# Patient Record
Sex: Female | Born: 1995 | Race: White | Hispanic: No | Marital: Single | Smoking: Never smoker
Health system: Southern US, Community
[De-identification: ages and names within clinical notes are randomized; demographics above are authoritative.]

## PROBLEM LIST (undated history)

## (undated) DIAGNOSIS — M419 Scoliosis, unspecified: Secondary | ICD-10-CM

## (undated) DIAGNOSIS — A498 Other bacterial infections of unspecified site: Secondary | ICD-10-CM

## (undated) DIAGNOSIS — L409 Psoriasis, unspecified: Secondary | ICD-10-CM

## (undated) DIAGNOSIS — F32A Depression, unspecified: Secondary | ICD-10-CM

## (undated) DIAGNOSIS — F419 Anxiety disorder, unspecified: Secondary | ICD-10-CM

## (undated) DIAGNOSIS — F329 Major depressive disorder, single episode, unspecified: Secondary | ICD-10-CM

## (undated) HISTORY — PX: MOUTH SURGERY: SHX715

## (undated) HISTORY — PX: APPENDECTOMY: SHX54

## (undated) HISTORY — DX: Other bacterial infections of unspecified site: A49.8

---
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
## 2018-07-17 ENCOUNTER — Other Ambulatory Visit: Payer: Self-pay | Admitting: Physician Assistant

## 2018-07-17 DIAGNOSIS — N644 Mastodynia: Secondary | ICD-10-CM

## 2018-07-26 ENCOUNTER — Other Ambulatory Visit: Payer: Self-pay

## 2018-07-26 ENCOUNTER — Ambulatory Visit
Admission: RE | Admit: 2018-07-26 | Discharge: 2018-07-26 | Disposition: A | Discharge status: Home / Self Care | Payer: Self-pay | Source: Ambulatory Visit | Attending: Physician Assistant | Admitting: Physician Assistant

## 2018-07-26 DIAGNOSIS — N644 Mastodynia: Secondary | ICD-10-CM

## 2018-10-05 NOTE — Progress Notes (Signed)
Office Visit Note  Patient: Evelyn Goodwin             Date of Birth: 25-Mar-1995           MRN: [id number]             PCP: Heywood Bene PA-C Referring: Arlyss Gandy* Visit Date: 10/11/2018 Occupation: Scientist, research (medical)  Subjective:  Other (patient has been on MTX and cosentyx in the past, currently on cimzia )   History of Present Illness: Evelyn Goodwin is a 23 y.o. female seen in consultation per request of her dermatologist.  According to patient in 2018 she developed psoriasis.  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  She was started on methotrexate which she took for 9 months.  Patient states while she was on methotrexate 4 tablets p.o. weekly.  She experienced frequent upper respiratory tract infections when she was on medication.  She states she stopped after 9 months as the skin cleared up.  Few months later she had recurrence of the rash along with joint pain.  At the time she was referred to Dr. Amil Amen.  She states Dr. Amil Amen did not find any evidence of arthritis and advised only treatment for psoriasis.  She was again seen by her dermatologist who tried Cosentyx.  Patient states she took it from May 2020 until July 2020.  Her skin cleared up but her joint pain persist.  At that time she was switched to Cimzia.  She states she has been on Cimzia for 2 months now.  She has been having flares of her skin psoriasis.  She also continues to have joint pain.  She describes pain and discomfort in her bilateral hands, bilateral wrist joints and her lower back.  She states the rash has been all over her body.  Patient states that her dermatologist has noted dactylitis in her toes.  There is positive history of psoriasis in her mother.  Activities of Daily Living:  Patient reports morning stiffness for several hours.   Patient Denies nocturnal pain.  Difficulty dressing/grooming: Denies Difficulty climbing stairs: Denies Difficulty  getting out of chair: Denies Difficulty using hands for taps, buttons, cutlery, and/or writing: Denies  Review of Systems  Constitutional: Positive for fatigue. Negative for night sweats, weight gain and weight loss.  HENT: Negative for mouth sores, trouble swallowing, trouble swallowing, mouth dryness and nose dryness.   Eyes: Positive for dryness. Negative for pain, redness, itching and visual disturbance.  Respiratory: Negative for cough, shortness of breath, wheezing and difficulty breathing.   Cardiovascular: Negative for chest pain, palpitations, hypertension, irregular heartbeat and swelling in legs/feet.  Gastrointestinal: Negative for blood in stool, constipation and diarrhea.  Endocrine: Negative for increased urination.  Genitourinary: Negative for difficulty urinating, painful urination and vaginal dryness.  Musculoskeletal: Positive for arthralgias, joint pain and morning stiffness. Negative for joint swelling, myalgias, muscle weakness, muscle tenderness and myalgias.  Skin: Positive for rash. Negative for color change, hair loss, skin tightness, ulcers and sensitivity to sunlight.  Allergic/Immunologic: Negative for susceptible to infections.  Neurological: Negative for dizziness, numbness, headaches, memory loss, night sweats and weakness.  Hematological: Positive for bruising/bleeding tendency. Negative for swollen glands.  Psychiatric/Behavioral: Negative for depressed mood, confusion and sleep disturbance. The patient is not nervous/anxious.     PMFS History:  Patient Active Problem List   Diagnosis Date Noted  . Anxiety and depression 10/11/2018  . Other idiopathic scoliosis, lumbar region 10/11/2018  . History of ADHD  10/11/2018  . 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 Psoriatic arthritis ([place]) 10/11/2018    History reviewed. No pertinent past medical history.  Family History  Problem Relation Age of Onset  . Psoriasis Mother   . Healthy Brother    Past Surgical  History:  Procedure Laterality Date  . [place] EXTRACTION  2014   Social History   Social History Narrative  . Not on file    There is no immunization history on file for this patient.   Objective: Vital Signs: BP 126/82 (BP Location: Right Arm, Patient Position: Sitting, Cuff Size: Normal)   Pulse (!) 103   Resp 12   Ht 5' 7.75" (1.721 m)   Wt 117 lb (53.1 kg)   BMI 17.92 kg/m    Physical Exam Vitals signs and nursing note reviewed.  Constitutional:      Appearance: She is well-developed.  HENT:     Head: Normocephalic and atraumatic.  Eyes:     Conjunctiva/sclera: Conjunctivae normal.  Neck:     Musculoskeletal: Normal range of motion.  Cardiovascular:     Rate and Rhythm: Normal rate and regular rhythm.     Heart sounds: Normal heart sounds.  Pulmonary:     Effort: Pulmonary effort is normal.     Breath sounds: Normal breath sounds.  Abdominal:     General: Bowel sounds are normal.     Palpations: Abdomen is soft.  Lymphadenopathy:     Cervical: No cervical adenopathy.  Skin:    General: Skin is warm and dry.     Capillary Refill: Capillary refill takes less than 2 seconds.     Comments: Guttate psoriasis lesions were noted on her extremities and trunk.  She also had a rash from recent allergic reaction on her face.  Neurological:     Mental Status: She is alert and oriented to person, place, and time.  Psychiatric:        Behavior: Behavior normal.      Musculoskeletal Exam: C-spine was in good range of motion.  She has thoracolumbar scoliosis.  She has some tenderness over SI joint and lumbar region.  Shoulder joints, elbow joints, wrist joints, MCPs PIPs DIPs with good range of motion.  She complains of tenderness on palpation over her wrist joints and PIPs.  No synovitis was noted.  She had good range of motion of her hip joints and knee joints.  Ankle joints MTPs PIPs with good range of motion.  She has bilateral pes planus.  CDAI Exam: CDAI Score:  - Patient Global: -; Provider Global: - Swollen: -; Tender: - Joint Exam   No joint exam has been documented for this visit   There is currently no information documented on the homunculus. Go to the Rheumatology activity and complete the homunculus joint exam.  Investigation: No additional findings.  Imaging: No results found.  Recent Labs: No results found for: WBC, HGB, PLT, NA, K, CL, CO2, GLUCOSE, BUN, CREATININE, BILITOT, ALKPHOS, AST, ALT, PROT, ALBUMIN, CALCIUM, GFRAA, QFTBGOLD, QFTBGOLDPLUS  Speciality Comments: No specialty comments available.  Procedures:  No procedures performed Allergies: Penicillin g   Assessment / Plan:     Visit Diagnoses: Psoriatic arthritis ([place]) -patient complains of pain and discomfort in multiple joints.  She has discomfort in her left shoulder, bilateral wrists, bilateral PIPs.  She also has history of dactylitis witnessed by her dermatologist.  Patient reports her joint pain has been worse since she has been on Cimzia.  She was treated with  Cosentyx for about 2 months but it was discontinued due to ongoing joint discomfort.  Patient gives history of total clearance of her skin on Cosentyx which has recurred now.  She also felt that she had less infections on Cosentyx.  We had detailed discussion regarding different treatment options.  I believe the trial of Cosentyx was very short and she may benefit by switching back to Cosentyx.  She has had all the labs done by her dermatologist.  I have advised her to schedule an appointment with a dermatologist and asked to switch back to Cosentyx.  She should discontinue Cimzia at least 2 weeks prior to Cosentyx dose.  I would like to see her back in 3 months to see the response to Cosentyx.  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  High risk medication use - previously on MTX (2018) she took it for 9 months but had frequent infections.  She tried  Cosentyx for 2 months and discontinued as she felt it was not very effective., Cimzia (2020) she had been on Cimzia for 2 months and having a flare of her psoriasis and increased joint pain.  Labs have been monitored by her dermatologist.  Pain in both hands -she complains of increased discomfort in her hands.  No synovitis was noted.  It is difficult to assess as she is already on treatment.  Plan: XR Hand 2 View Right, XR Hand 2 View Left.  X-ray of bilateral hands were unremarkable.  Pain in both feet -patient gives history of dactylitis witnessed by her dermatologist.  I do not see any synovitis on examination.  She has pes planus.  Proper fitting shoes were discussed.  Plan: XR Foot 2 Views Right, XR Foot 2 Views Left.  The x-ray of bilateral feet were within normal limits.  Chronic midline low back pain without sciatica -she has thoracolumbar dextroscoliosis.  She complains of chronic lower back pain.  She has some tenderness on palpation of her lumbar region.  Plan: XR Lumbar Spine 2-3 Views.  The x-ray showed dextroscoliosis.  No syndesmophytes or disc space narrowing was noted.  A handout on back exercises was given.  Chronic SI joint pain -she has tenderness on palpation over SI joints.  Plan: XR Pelvis 1-2 Views.  The x-ray of SI joints were unremarkable.  Other idiopathic scoliosis, thoracolumbar region  Positive ANA (antinuclear antibody) - 03/30/18:ANA 1:40 NS, Hep b surface Ab-, Hep C ab-04/14/17: Sed rate 2, CRP<0.31/25/19: RF<14, CRP 0.2, sed rate 6, ANA 1:80 nucleolar.  ANA was low titer and not significant.  Anxiety and depression  History of ADHD  Family history of psoriasis in mother  Orders: Orders Placed This Encounter  Procedures  . XR Hand 2 View Right  . XR Hand 2 View Left  . XR Foot 2 Views Right  . XR Foot 2 Views Left  . XR Lumbar Spine 2-3 Views  . XR Pelvis 1-2 Views   No orders of the defined types were placed in this encounter.   Face-to-face time  spent with patient was 50 minutes. Greater than 50% of time was spent in counseling and coordination of care.  Follow-Up Instructions: Return in about 3 months (around 01/10/2019) for Psoriatic arthritis, psoriasis.   Bo Merino, MD  Note - This record has been created using Editor, commissioning.  Chart creation errors have been sought, but may not always  have been located. Such creation errors do not reflect on  the standard of medical care.

## 2018-10-11 ENCOUNTER — Ambulatory Visit: Payer: Self-pay

## 2018-10-11 ENCOUNTER — Ambulatory Visit (INDEPENDENT_AMBULATORY_CARE_PROVIDER_SITE_OTHER): Payer: BC Managed Care – PPO | Admitting: Rheumatology

## 2018-10-11 ENCOUNTER — Other Ambulatory Visit: Payer: Self-pay

## 2018-10-11 ENCOUNTER — Encounter: Payer: Self-pay | Admitting: Rheumatology

## 2018-10-11 VITALS — BP 126/82 | HR 103 | Resp 12 | Ht 67.75 in | Wt 117.0 lb

## 2018-10-11 DIAGNOSIS — M79671 Pain in right foot: Secondary | ICD-10-CM | POA: Diagnosis not present

## 2018-10-11 DIAGNOSIS — G8929 Other chronic pain: Secondary | ICD-10-CM

## 2018-10-11 DIAGNOSIS — M79672 Pain in left foot: Secondary | ICD-10-CM

## 2018-10-11 DIAGNOSIS — L404 Guttate psoriasis: Secondary | ICD-10-CM

## 2018-10-11 DIAGNOSIS — Z79899 Other long term (current) drug therapy: Secondary | ICD-10-CM

## 2018-10-11 DIAGNOSIS — M545 Low back pain, unspecified: Secondary | ICD-10-CM

## 2018-10-11 DIAGNOSIS — M533 Sacrococcygeal disorders, not elsewhere classified: Secondary | ICD-10-CM | POA: Diagnosis not present

## 2018-10-11 DIAGNOSIS — M4125 Other idiopathic scoliosis, thoracolumbar region: Secondary | ICD-10-CM

## 2018-10-11 DIAGNOSIS — L4050 Arthropathic psoriasis, unspecified: Secondary | ICD-10-CM | POA: Diagnosis not present

## 2018-10-11 DIAGNOSIS — M79642 Pain in left hand: Secondary | ICD-10-CM

## 2018-10-11 DIAGNOSIS — M4126 Other idiopathic scoliosis, lumbar region: Secondary | ICD-10-CM | POA: Insufficient documentation

## 2018-10-11 DIAGNOSIS — F329 Major depressive disorder, single episode, unspecified: Secondary | ICD-10-CM | POA: Insufficient documentation

## 2018-10-11 DIAGNOSIS — F32A Depression, unspecified: Secondary | ICD-10-CM | POA: Insufficient documentation

## 2018-10-11 DIAGNOSIS — R768 Other specified abnormal immunological findings in serum: Secondary | ICD-10-CM

## 2018-10-11 DIAGNOSIS — F419 Anxiety disorder, unspecified: Secondary | ICD-10-CM

## 2018-10-11 DIAGNOSIS — M79641 Pain in right hand: Secondary | ICD-10-CM

## 2018-10-11 DIAGNOSIS — Z8659 Personal history of other mental and behavioral disorders: Secondary | ICD-10-CM

## 2018-10-11 DIAGNOSIS — R7689 Other specified abnormal immunological findings in serum: Secondary | ICD-10-CM

## 2018-10-11 DIAGNOSIS — Z840 Family history of diseases of the skin and subcutaneous tissue: Secondary | ICD-10-CM

## 2018-10-11 NOTE — Patient Instructions (Signed)

## 2018-10-13 ENCOUNTER — Ambulatory Visit: Payer: Self-pay | Admitting: Rheumatology

## 2018-11-07 ENCOUNTER — Ambulatory Visit: Payer: Self-pay | Admitting: Rheumatology

## 2018-11-28 ENCOUNTER — Emergency Department (HOSPITAL_BASED_OUTPATIENT_CLINIC_OR_DEPARTMENT_OTHER): Payer: BC Managed Care – PPO

## 2018-11-28 ENCOUNTER — Encounter (HOSPITAL_BASED_OUTPATIENT_CLINIC_OR_DEPARTMENT_OTHER): Payer: Self-pay | Admitting: *Deleted

## 2018-11-28 ENCOUNTER — Other Ambulatory Visit: Payer: Self-pay

## 2018-11-28 ENCOUNTER — Emergency Department (HOSPITAL_BASED_OUTPATIENT_CLINIC_OR_DEPARTMENT_OTHER)
Admission: EM | Admit: 2018-11-28 | Discharge: 2018-11-29 | Disposition: A | Discharge status: Home / Self Care | Payer: BC Managed Care – PPO | Attending: Emergency Medicine | Admitting: Emergency Medicine

## 2018-11-28 DIAGNOSIS — E860 Dehydration: Secondary | ICD-10-CM | POA: Insufficient documentation

## 2018-11-28 DIAGNOSIS — R110 Nausea: Secondary | ICD-10-CM | POA: Diagnosis not present

## 2018-11-28 DIAGNOSIS — Z20828 Contact with and (suspected) exposure to other viral communicable diseases: Secondary | ICD-10-CM | POA: Diagnosis not present

## 2018-11-28 DIAGNOSIS — R1084 Generalized abdominal pain: Secondary | ICD-10-CM | POA: Insufficient documentation

## 2018-11-28 DIAGNOSIS — K5790 Diverticulosis of intestine, part unspecified, without perforation or abscess without bleeding: Secondary | ICD-10-CM

## 2018-11-28 DIAGNOSIS — R109 Unspecified abdominal pain: Secondary | ICD-10-CM | POA: Diagnosis present

## 2018-11-28 DIAGNOSIS — Z79899 Other long term (current) drug therapy: Secondary | ICD-10-CM | POA: Diagnosis not present

## 2018-11-28 DIAGNOSIS — Z20822 Contact with and (suspected) exposure to covid-19: Secondary | ICD-10-CM

## 2018-11-28 HISTORY — DX: Anxiety disorder, unspecified: F41.9

## 2018-11-28 LAB — COMPREHENSIVE METABOLIC PANEL
ALT: 29 U/L (ref 0–44)
AST: 24 U/L (ref 15–41)
Albumin: 4.3 g/dL (ref 3.5–5.0)
Alkaline Phosphatase: 56 U/L (ref 38–126)
Anion gap: 13 (ref 5–15)
BUN: 13 mg/dL (ref 6–20)
CO2: 20 mmol/L — ABNORMAL LOW (ref 22–32)
Calcium: 9.2 mg/dL (ref 8.9–10.3)
Chloride: 105 mmol/L (ref 98–111)
Creatinine, Ser: 0.65 mg/dL (ref 0.44–1.00)
GFR calc Af Amer: >60 mL/min (ref >60)
GFR calc non Af Amer: >60 mL/min (ref >60)
Glucose, Bld: 92 mg/dL (ref 70–99)
Potassium: 3.7 mmol/L (ref 3.5–5.1)
Sodium: 138 mmol/L (ref 135–145)
Total Bilirubin: 0.5 mg/dL (ref 0.3–1.2)
Total Protein: 8.3 g/dL — ABNORMAL HIGH (ref 6.5–8.1)

## 2018-11-28 LAB — URINALYSIS, MICROSCOPIC (REFLEX)

## 2018-11-28 LAB — CBC WITH DIFFERENTIAL/PLATELET
Abs Immature Granulocytes: 0.03 10*3/uL (ref 0.00–0.07)
Basophils Absolute: 0.1 10*3/uL (ref 0.0–0.1)
Basophils Relative: 1 %
Eosinophils Absolute: 0 10*3/uL (ref 0.0–0.5)
Eosinophils Relative: 0 %
HCT: 42.7 % (ref 36.0–46.0)
Hemoglobin: 13.3 g/dL (ref 12.0–15.0)
Immature Granulocytes: 0 %
Lymphocytes Relative: 15 %
Lymphs Abs: 1.7 10*3/uL (ref 0.7–4.0)
MCH: 26.6 pg (ref 26.0–34.0)
MCHC: 31.1 g/dL (ref 30.0–36.0)
MCV: 85.4 fL (ref 80.0–100.0)
Monocytes Absolute: 0.6 10*3/uL (ref 0.1–1.0)
Monocytes Relative: 6 %
Neutro Abs: 8.8 10*3/uL — ABNORMAL HIGH (ref 1.7–7.7)
Neutrophils Relative %: 78 %
Platelets: 422 10*3/uL — ABNORMAL HIGH (ref 150–400)
RBC: 5 MIL/uL (ref 3.87–5.11)
RDW: 15.2 % (ref 11.5–15.5)
WBC: 11.2 10*3/uL — ABNORMAL HIGH (ref 4.0–10.5)
nRBC: 0 % (ref 0.0–0.2)

## 2018-11-28 LAB — LACTIC ACID, PLASMA: Lactic Acid, Venous: 1.3 mmol/L (ref 0.5–1.9)

## 2018-11-28 LAB — URINALYSIS, ROUTINE W REFLEX MICROSCOPIC
Glucose, UA: NEGATIVE mg/dL
Ketones, ur: 40 mg/dL — AB
Leukocytes,Ua: NEGATIVE
Nitrite: NEGATIVE
Protein, ur: NEGATIVE mg/dL
Specific Gravity, Urine: >1.03 — ABNORMAL HIGH (ref 1.005–1.030)
pH: 6 (ref 5.0–8.0)

## 2018-11-28 LAB — LIPASE, BLOOD: Lipase: 36 U/L (ref 11–51)

## 2018-11-28 LAB — MAGNESIUM: Magnesium: 2 mg/dL (ref 1.7–2.4)

## 2018-11-28 LAB — PREGNANCY, URINE: Preg Test, Ur: NEGATIVE

## 2018-11-28 MED ORDER — SODIUM CHLORIDE 0.9 % IV BOLUS
1000.0000 mL | Freq: Once | INTRAVENOUS | Status: AC
  Administered 2018-11-28: 1000 mL via INTRAVENOUS

## 2018-11-28 MED ORDER — SODIUM CHLORIDE 0.9 % IV BOLUS
2000.0000 mL | Freq: Once | INTRAVENOUS | Status: AC
  Administered 2018-11-28: 19:00:00 2000 mL via INTRAVENOUS

## 2018-11-28 MED ORDER — IOHEXOL 350 MG/ML SOLN
100.0000 mL | Freq: Once | INTRAVENOUS | Status: AC | PRN
  Administered 2018-11-28: 100 mL via INTRAVENOUS

## 2018-11-28 MED ORDER — FENTANYL CITRATE (PF) 100 MCG/2ML IJ SOLN
50.0000 ug | Freq: Once | INTRAMUSCULAR | Status: AC
  Administered 2018-11-28: 50 ug via INTRAVENOUS
  Filled 2018-11-28: qty 2

## 2018-11-28 MED ORDER — ONDANSETRON HCL 4 MG/2ML IJ SOLN
4.0000 mg | Freq: Once | INTRAMUSCULAR | Status: AC | PRN
  Administered 2018-11-28: 4 mg via INTRAVENOUS
  Filled 2018-11-28: qty 2

## 2018-11-28 NOTE — ED Notes (Signed)
ED Provider at bedside. 

## 2018-11-28 NOTE — ED Notes (Signed)
X-ray at bedside

## 2018-11-28 NOTE — ED Notes (Signed)
Patient transported to CT 

## 2018-11-28 NOTE — ED Notes (Signed)
Pt wants to talk to ordering provider before getting CT scan. PA aware.

## 2018-11-28 NOTE — Discharge Instructions (Signed)
Please quarantine yourself at home.  Even if your test comes back negative if you are still having symptoms you need to stay quarantined.  Schedule a follow-up appointment with your primary care doctor.  If you develop fevers, worsening symptoms, or unable to tolerate food or liquid or have any other concerns please seek additional medical care and evaluation.

## 2018-11-28 NOTE — ED Provider Notes (Signed)
[facility] EMERGENCY DEPARTMENT Provider Note   CSN: [id number] Arrival date & time: 11/28/18  1800     History   Chief Complaint Chief Complaint  Patient presents with   Abdominal Pain    HPI Evelyn Goodwin is a 23 y.o. female with a past medical history of psoriasis, ADHD, who presents today for evaluation of multiple complaints. She reports that over the past 2 to 3 days she has had worsening abdominal pain on the right side.  During this she has had nausea however has not vomited.  She denies any constipation or diarrhea.  She feels like the pain comes up into her chest some however does not radiate or move.  She denies any specific shortness of breath.  She denies any abnormal vaginal discharge.  No dysuria increased frequency or urgency.  She also reports that she has a coronavirus test in process by her PCP.  Today she went to the grocery store where she had a near syncopal event.  She reports that anytime she stands up she gets very lightheaded.  She has had decreased p.o. intake secondary to nausea and pain.  She has also been taking Levaquin recently for a right-sided ear infection.  She was previously taking a different antibiotic however does not know the name.  She is on day 5 of Levaquin and reports that her ear pain has improved.  She denies any headache or dizziness.     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      Home Medications    Prior to Admission medications   Medication Sig Start Date End Date Taking? Authorizing Provider    escitalopram (LEXAPRO) 10 MG tablet Take 10 mg by mouth daily.   Yes [provider]  levofloxacin (LEVAQUIN) 250 MG tablet Take 250 mg by mouth daily.   Yes [provider]  amphetamine-dextroamphetamine (ADDERALL XR) 20 MG 24 hr capsule Take by mouth. 10/13/18   [provider]  Certolizumab Pegol (CIMZIA [place]) Inject into the skin every 14 (fourteen) days.    [provider]  clotrimazole-betamethasone (LOTRISONE) cream Apply to affected area twice daily 10/09/18   [provider]  norethindrone-ethinyl estradiol (JUNEL 1/20) 1-20 MG-MCG tablet TAKE 1 TABLET BY MOUTH EVERY DAY 08/17/18   [provider]    Family History Family History  Problem Relation Age of Onset   Psoriasis Mother    Healthy Brother     Social History Social History   Tobacco Use   Smoking status: Never Smoker   Smokeless tobacco: Never Used  Substance Use Topics   Alcohol use: Not Currently   Drug use: Not Currently     Allergies   Penicillin g   Review of Systems Review of Systems  Constitutional: Positive for appetite change, chills and fatigue. Negative for fever.  HENT: Negative for ear pain, facial swelling and hearing loss.   Eyes: Negative for visual disturbance.  Respiratory: Negative for cough.   Cardiovascular: Negative for chest pain.  Gastrointestinal: Positive for abdominal pain  and nausea. Negative for diarrhea and vomiting.  Genitourinary: Negative for dysuria.  Musculoskeletal: Negative for back pain and neck pain.  Skin: Negative for color change and wound.  Neurological: Negative for weakness and headaches.  Psychiatric/Behavioral: Negative for confusion.  All other systems reviewed and are negative.    Physical Exam Updated Vital Signs BP 130/84    Pulse 82    Temp 98.3 F (36.8 C) (Oral)    Resp 13    Ht 5' 7"  (1.702 m)    Wt 53.1 kg    SpO2 100%    BMI 18.32 kg/m   Physical Exam Vitals signs and nursing note  reviewed.  Constitutional:      General: She is not in acute distress.    Appearance: She is well-developed.  HENT:     Head: Normocephalic and atraumatic.  Eyes:     Conjunctiva/sclera: Conjunctivae normal.  Neck:     Musculoskeletal: Neck supple.  Cardiovascular:     Rate and Rhythm: Regular rhythm. Tachycardia present.     Heart sounds: Normal heart sounds. No murmur.  Pulmonary:     Effort: Pulmonary effort is normal. No respiratory distress.     Breath sounds: Normal breath sounds.  Abdominal:     General: Abdomen is flat. Bowel sounds are decreased. There is no distension.     Palpations: Abdomen is soft.     Tenderness: There is abdominal tenderness in the right upper quadrant, right lower quadrant and epigastric area. There is no guarding or rebound.     Hernia: No hernia is present.  Skin:    General: Skin is warm and dry.  Neurological:     General: No focal deficit present.     Mental Status: She is alert.     Cranial Nerves: No cranial nerve deficit.  Psychiatric:        Mood and Affect: Mood normal.        Behavior: Behavior normal.      ED Treatments / Results  Labs (all labs ordered are listed, but only abnormal results are displayed) Labs Reviewed  CBC WITH DIFFERENTIAL/PLATELET - Abnormal; Notable for the following components:      Result Value   WBC 11.2 (*)    Platelets 422 (*)    Neutro Abs 8.8 (*)    All other components within normal limits  COMPREHENSIVE METABOLIC PANEL - Abnormal; Notable for the following components:   CO2 20 (*)    Total Protein 8.3 (*)    All other components within normal limits  URINALYSIS, ROUTINE W REFLEX MICROSCOPIC - Abnormal; Notable for the following components:   Specific Gravity, Urine >1.030 (*)    Hgb urine dipstick MODERATE (*)    Bilirubin Urine SMALL (*)    Ketones, ur 40 (*)    All other components within normal limits  URINALYSIS, MICROSCOPIC (REFLEX) - Abnormal; Notable for the following components:    Bacteria, UA RARE (*)    All other components within normal limits  LIPASE, BLOOD  MAGNESIUM  PREGNANCY, URINE  LACTIC ACID, PLASMA  LACTIC ACID, PLASMA    EKG None  Radiology Ct Angio Chest Pe W/cm &/or Wo Cm  Result Date: 11/28/2018 CLINICAL DATA:  Chest and abdominal pain EXAM: CT ANGIOGRAPHY CHEST CT ABDOMEN AND PELVIS WITH CONTRAST TECHNIQUE: Multidetector CT imaging of the chest was performed using the standard protocol during bolus administration of intravenous contrast. Multiplanar CT image reconstructions and MIPs were obtained to evaluate the vascular anatomy. Multidetector  CT imaging of the abdomen and pelvis was performed using the standard protocol during bolus administration of intravenous contrast. CONTRAST:  160m OMNIPAQUE IOHEXOL 350 MG/ML SOLN COMPARISON:  None. FINDINGS: CTA CHEST FINDINGS Cardiovascular: Thoracic aorta is well visualize with a normal branching pattern. No aneurysmal dilatation or dissection is noted. No cardiac enlargement is seen. No coronary calcifications are noted. The pulmonary artery is well visualized within normal branching pattern. No filling defects are identified to suggest pulmonary emboli. Mediastinum/Nodes: Thoracic inlet is within normal limits. No hilar or mediastinal adenopathy is noted. No mediastinal hematoma is seen. The esophagus is within normal limits. Lungs/Pleura: Lungs are clear. No pleural effusion or pneumothorax. Musculoskeletal: No chest wall abnormality. No acute or significant osseous findings. Review of the MIP images confirms the above findings. CT ABDOMEN and PELVIS FINDINGS Hepatobiliary: No focal liver abnormality is seen. No gallstones, gallbladder wall thickening, or biliary dilatation. Pancreas: Unremarkable. No pancreatic ductal dilatation or surrounding inflammatory changes. Spleen: Normal in size without focal abnormality. Adrenals/Urinary Tract: Adrenal glands are within normal limits. Kidneys demonstrate no renal  calculi or urinary tract obstructive changes. The bladder is partially distended. Stomach/Bowel: Scattered diverticular change of the colon is noted without evidence of diverticulitis. The appendix is within normal limits. No inflammatory changes to suggest appendicitis are seen. No small bowel abnormality is noted. The stomach is decompressed. Vascular/Lymphatic: No significant vascular findings are present. No enlarged abdominal or pelvic lymph nodes. Reproductive: Uterus and bilateral adnexa are unremarkable. Other: No abdominal wall hernia or abnormality. No abdominopelvic ascites. Musculoskeletal: Mild scoliosis concave to the left is noted at the thoracolumbar junction. Review of the MIP images confirms the above findings. IMPRESSION: CTA of the chest: No evidence of pulmonary emboli. No acute intrathoracic abnormality is noted. CT of the abdomen and pelvis: Scattered diverticular change of the colon without diverticulitis. Normal-appearing appendix. No other focal abnormality is seen. Electronically Signed   By: MInez CatalinaM.D.   On: 11/28/2018 21:41   Ct Abdomen Pelvis W Contrast  Result Date: 11/28/2018 CLINICAL DATA:  Chest and abdominal pain EXAM: CT ANGIOGRAPHY CHEST CT ABDOMEN AND PELVIS WITH CONTRAST TECHNIQUE: Multidetector CT imaging of the chest was performed using the standard protocol during bolus administration of intravenous contrast. Multiplanar CT image reconstructions and MIPs were obtained to evaluate the vascular anatomy. Multidetector CT imaging of the abdomen and pelvis was performed using the standard protocol during bolus administration of intravenous contrast. CONTRAST:  1040mOMNIPAQUE IOHEXOL 350 MG/ML SOLN COMPARISON:  None. FINDINGS: CTA CHEST FINDINGS Cardiovascular: Thoracic aorta is well visualize with a normal branching pattern. No aneurysmal dilatation or dissection is noted. No cardiac enlargement is seen. No coronary calcifications are noted. The pulmonary artery is  well visualized within normal branching pattern. No filling defects are identified to suggest pulmonary emboli. Mediastinum/Nodes: Thoracic inlet is within normal limits. No hilar or mediastinal adenopathy is noted. No mediastinal hematoma is seen. The esophagus is within normal limits. Lungs/Pleura: Lungs are clear. No pleural effusion or pneumothorax. Musculoskeletal: No chest wall abnormality. No acute or significant osseous findings. Review of the MIP images confirms the above findings. CT ABDOMEN and PELVIS FINDINGS Hepatobiliary: No focal liver abnormality is seen. No gallstones, gallbladder wall thickening, or biliary dilatation. Pancreas: Unremarkable. No pancreatic ductal dilatation or surrounding inflammatory changes. Spleen: Normal in size without focal abnormality. Adrenals/Urinary Tract: Adrenal glands are within normal limits. Kidneys demonstrate no renal calculi or urinary tract obstructive changes. The bladder is partially distended. Stomach/Bowel: Scattered diverticular change of  the colon is noted without evidence of diverticulitis. The appendix is within normal limits. No inflammatory changes to suggest appendicitis are seen. No small bowel abnormality is noted. The stomach is decompressed. Vascular/Lymphatic: No significant vascular findings are present. No enlarged abdominal or pelvic lymph nodes. Reproductive: Uterus and bilateral adnexa are unremarkable. Other: No abdominal wall hernia or abnormality. No abdominopelvic ascites. Musculoskeletal: Mild scoliosis concave to the left is noted at the thoracolumbar junction. Review of the MIP images confirms the above findings. IMPRESSION: CTA of the chest: No evidence of pulmonary emboli. No acute intrathoracic abnormality is noted. CT of the abdomen and pelvis: Scattered diverticular change of the colon without diverticulitis. Normal-appearing appendix. No other focal abnormality is seen. Electronically Signed   By: Inez Catalina M.D.   On:  11/28/2018 21:41   Dg Chest Port 1 View  Result Date: 11/28/2018 CLINICAL DATA:  Chest and abdominal pain. EXAM: PORTABLE CHEST 1 VIEW COMPARISON:  10/21/2012 FINDINGS: The cardiomediastinal contours are normal. The lungs are clear. Pulmonary vasculature is normal. No consolidation, pleural effusion, or pneumothorax. No acute osseous abnormalities are seen. IMPRESSION: Normal AP chest radiograph. Electronically Signed   By: Keith Rake M.D.   On: 11/28/2018 19:48    Procedures Procedures (including critical care time)  Medications Ordered in ED Medications  sodium chloride 0.9 % bolus 2,000 mL (0 mLs Intravenous Stopped 11/28/18 2039)  fentaNYL (SUBLIMAZE) injection 50 mcg (50 mcg Intravenous Given 11/28/18 2144)  ondansetron (ZOFRAN) injection 4 mg (4 mg Intravenous Given 11/28/18 2143)  sodium chloride 0.9 % bolus 1,000 mL (1,000 mLs Intravenous New Bag/Given 11/28/18 2143)  iohexol (OMNIPAQUE) 350 MG/ML injection 100 mL (100 mLs Intravenous Contrast Given 11/28/18 2117)   Orthostatic VS for the past 24 hrs:  BP- Lying Pulse- Lying BP- Sitting Pulse- Sitting BP- Standing at 0 minutes Pulse- Standing at 0 minutes  11/28/18 2217 114/82 66 117/84 74 116/88 92  11/28/18 1841 121/89 107 132/84 115 110/78 132      Initial Impression / Assessment and Plan / ED Course  I have reviewed the triage vital signs and the nursing notes.  Pertinent labs & imaging results that were available during my care of the patient were reviewed by me and considered in my medical decision making (see chart for details).  Clinical Course as of Nov 27 2340  Tue Nov 28, 2018  2050 Attempted to speak with patient about her results and attempt for scan.  She was using the restroom.  Attempted to contact her on the room phone which rang busy.   [EH]  2222 Updated patient on results.  Plan to repeat orthostatics and p.o. challenge.   [EH]  2324 Patient reports that she is feeling better and wishes to go  home at this time.  She did not get dizzy with repeat orthostatics.   [EH]    Clinical Course User Index [EH] Lorin Glass, PA-C      Patient presents today for evaluation of abdominal pain, nausea, and presyncopal events.  She is currently being tested for coronavirus by her primary care doctor.  She was orthostatic on arrival with heart rates elevating from 107 laying up into the 130s/40s with standing.  She has a slight leukocytosis at 11.2.  She is currently taking Levaquin for a ear infection.  CMP does not show a significant derangements.  Urine is not infected and she is not having urinary symptoms.  Urine does show elevated specific gravity consistent with suspected dehydration.  Lactic acid is not elevated and pregnancy test is negative.  Her abdomen is tender to palpation in the right upper and right lower quadrant raising concern for appendicitis versus cholecystitis.  CT scan abdomen pelvis was obtained showing diverticulosis without diverticulitis and no cause for her pain or symptoms found.  Based on her presyncopal events along with the degree of tachycardia and orthostasis CTA PE study was performed at the same time as CT abdomen pelvis which did not show any significant lung abnormalities, evidence of PE or cause for her symptoms.  Recommended conservative care.  She is instructed on bland diet and continued oral rehydration.  On repeat orthostatics after fluids she no longer felt dizzy and had improvement in both resting heart rate and orthostatic vital signs.  She requested discharge home.  Evelyn Goodwin was evaluated in Emergency Department on 11/28/2018 for the symptoms described in the history of present illness. She was evaluated in the context of the global COVID-19 pandemic, which necessitated consideration that the patient might be at risk for infection with the SARS-CoV-2 virus that causes COVID-19. Institutional protocols and algorithms that pertain to the  evaluation of patients at risk for COVID-19 are in a state of rapid change based on information released by regulatory bodies including the CDC and federal and state organizations. These policies and algorithms were followed during the patient's care in the ED.  Return precautions were discussed with patient who states their understanding.  At the time of discharge patient denied any unaddressed complaints or concerns.  Patient is agreeable for discharge home.   Final Clinical Impressions(s) / ED Diagnoses   Final diagnoses:  Suspected COVID-19 virus infection  Generalized abdominal pain  Diverticulosis  Dehydration    ED Discharge Orders    None       Lorin Glass, PA-C 11/28/18 2347    Maudie Flakes, MD 12/02/18 [phone]

## 2018-11-28 NOTE — ED Triage Notes (Signed)
C/o abd pain  And nausea and chest pain x 2 days , hx anxiety

## 2018-12-24 ENCOUNTER — Encounter (HOSPITAL_BASED_OUTPATIENT_CLINIC_OR_DEPARTMENT_OTHER): Payer: Self-pay | Admitting: *Deleted

## 2018-12-24 ENCOUNTER — Emergency Department (HOSPITAL_BASED_OUTPATIENT_CLINIC_OR_DEPARTMENT_OTHER)
Admission: EM | Admit: 2018-12-24 | Discharge: 2018-12-24 | Disposition: A | Discharge status: Home / Self Care | Payer: BC Managed Care – PPO | Attending: Emergency Medicine | Admitting: Emergency Medicine

## 2018-12-24 ENCOUNTER — Emergency Department (HOSPITAL_BASED_OUTPATIENT_CLINIC_OR_DEPARTMENT_OTHER): Payer: BC Managed Care – PPO

## 2018-12-24 ENCOUNTER — Other Ambulatory Visit: Payer: Self-pay

## 2018-12-24 DIAGNOSIS — Z880 Allergy status to penicillin: Secondary | ICD-10-CM | POA: Diagnosis not present

## 2018-12-24 DIAGNOSIS — Y929 Unspecified place or not applicable: Secondary | ICD-10-CM | POA: Insufficient documentation

## 2018-12-24 DIAGNOSIS — S0033XA Contusion of nose, initial encounter: Secondary | ICD-10-CM | POA: Diagnosis not present

## 2018-12-24 DIAGNOSIS — S62635A Displaced fracture of distal phalanx of left ring finger, initial encounter for closed fracture: Secondary | ICD-10-CM | POA: Diagnosis not present

## 2018-12-24 DIAGNOSIS — Y999 Unspecified external cause status: Secondary | ICD-10-CM | POA: Insufficient documentation

## 2018-12-24 DIAGNOSIS — S6992XA Unspecified injury of left wrist, hand and finger(s), initial encounter: Secondary | ICD-10-CM | POA: Diagnosis present

## 2018-12-24 DIAGNOSIS — S0083XA Contusion of other part of head, initial encounter: Secondary | ICD-10-CM | POA: Diagnosis not present

## 2018-12-24 DIAGNOSIS — Z79899 Other long term (current) drug therapy: Secondary | ICD-10-CM | POA: Diagnosis not present

## 2018-12-24 DIAGNOSIS — S62633A Displaced fracture of distal phalanx of left middle finger, initial encounter for closed fracture: Secondary | ICD-10-CM | POA: Insufficient documentation

## 2018-12-24 DIAGNOSIS — Y939 Activity, unspecified: Secondary | ICD-10-CM | POA: Diagnosis not present

## 2018-12-24 DIAGNOSIS — R42 Dizziness and giddiness: Secondary | ICD-10-CM | POA: Diagnosis not present

## 2018-12-24 HISTORY — DX: Scoliosis, unspecified: M41.9

## 2018-12-24 HISTORY — DX: Depression, unspecified: F32.A

## 2018-12-24 HISTORY — DX: Psoriasis, unspecified: L40.9

## 2018-12-24 LAB — RAPID URINE DRUG SCREEN, HOSP PERFORMED
Amphetamines: NOT DETECTED
Barbiturates: NOT DETECTED
Benzodiazepines: NOT DETECTED
Cocaine: NOT DETECTED
Opiates: NOT DETECTED
Tetrahydrocannabinol: NOT DETECTED

## 2018-12-24 LAB — PREGNANCY, URINE: Preg Test, Ur: NEGATIVE

## 2018-12-24 MED ORDER — HYDROCODONE-ACETAMINOPHEN 5-325 MG PO TABS
1.0000 | ORAL_TABLET | Freq: Once | ORAL | Status: AC
  Administered 2018-12-24: 1 via ORAL
  Filled 2018-12-24: qty 1

## 2018-12-24 MED ORDER — HYDROCODONE-ACETAMINOPHEN 5-325 MG PO TABS: 2.0000 | ORAL_TABLET | ORAL | 0 refills | Status: DC | PRN

## 2018-12-24 MED ORDER — TETANUS-DIPHTH-ACELL PERTUSSIS 5-2.5-18.5 LF-MCG/0.5 IM SUSP
0.5000 mL | Freq: Once | INTRAMUSCULAR | Status: AC
  Administered 2018-12-24: 0.5 mL via INTRAMUSCULAR
  Filled 2018-12-24: qty 0.5

## 2018-12-24 NOTE — Discharge Instructions (Signed)
May take Tylenol and ibuprofen as needed for pain.  Denies take more than 4000 mg Tylenol or more than 2400 mg ibuprofen daily.  If you may think you may be pregnant do not take ibuprofen.  I have written you for a short course of pain medicine.  Only take as needed.  Do not drive or operate heavy machinery while taking this medication.  This medication also does contain Tylenol.  Do not take this with additional Tylenol.  Follow-up with orthopedics for reevaluation of your fractures in your hand.  Keep the splint on except when bathing.  You may replace them after bathing.
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
## 2019-01-16 ENCOUNTER — Telehealth: Payer: Self-pay | Admitting: Rheumatology

## 2019-01-16 NOTE — Telephone Encounter (Signed)
Charna Elizabeth from [facility] left a voicemail stating Bert is a mutual patient and called to see when she had her last TB quantiferon gold.  Please call back at [phone] and ask to speak with the nurse.

## 2019-01-16 NOTE — Telephone Encounter (Signed)
Spoke with Rollene Fare and advised her that Dr. Estanislado Pandy has only seen the patient once on 10/11/2018 and labs were not drawn.

## 2019-01-26 ENCOUNTER — Other Ambulatory Visit: Payer: Self-pay

## 2019-01-26 ENCOUNTER — Ambulatory Visit (INDEPENDENT_AMBULATORY_CARE_PROVIDER_SITE_OTHER): Payer: BC Managed Care – PPO | Admitting: Otolaryngology

## 2019-01-26 ENCOUNTER — Encounter (INDEPENDENT_AMBULATORY_CARE_PROVIDER_SITE_OTHER): Payer: Self-pay | Admitting: Otolaryngology

## 2019-01-26 VITALS — Temp 97.7°F

## 2019-01-26 DIAGNOSIS — H60313 Diffuse otitis externa, bilateral: Secondary | ICD-10-CM | POA: Diagnosis not present

## 2019-01-26 NOTE — Progress Notes (Signed)
HPI: Evelyn Goodwin is a 24 y.o. female who presents for evaluation of chronic ear problems she has had since October.  She has previously seen Dr. Erik Obey and had a hearing test which was normal.  She complains of itching as well as pain in her ears as well as excessive drainage from both ears.  She has been tried on eardrops.  But she keeps having problems and today the left ear is little bit more sore than the right ear. She takes shots for psoriasis which she has had for years. She is allergic to penicillin. She works as up at [employer].  Past Medical History:  Diagnosis Date  . Anxiety   . Depression   . Psoriasis   . Scoliosis    Past Surgical History:  Procedure Laterality Date  . WISDOM TOOTH EXTRACTION  2014   Social History   Socioeconomic History  . Marital status: Single    Spouse name: Not on file  . Number of children: Not on file  . Years of education: Not on file  . Highest education level: Not on file  Occupational History  . Not on file  Tobacco Use  . Smoking status: Never Smoker  . Smokeless tobacco: Never Used  Substance and Sexual Activity  . Alcohol use: Not Currently  . Drug use: Not Currently  . Sexual activity: Not on file  Other Topics Concern  . Not on file  Social History Narrative  . Not on file   Social Determinants of Health   Financial Resource Strain:   . Difficulty of Paying Living Expenses: Not on file  Food Insecurity:   . Worried About Charity fundraiser in the Last Year: Not on file  . Ran Out of Food in the Last Year: Not on file  Transportation Needs:   . Lack of Transportation (Medical): Not on file  . Lack of Transportation (Non-Medical): Not on file  Physical Activity:   . Days of Exercise per Week: Not on file  . Minutes of Exercise per Session: Not on file  Stress:   . Feeling of Stress : Not on file  Social Connections:   . Frequency of Communication with Friends and Family: Not on file  .  Frequency of Social Gatherings with Friends and Family: Not on file  . Attends Religious Services: Not on file  . Active Member of Clubs or Organizations: Not on file  . Attends Archivist Meetings: Not on file  . Marital Status: Not on file   Family History  Problem Relation Age of Onset  . Psoriasis Mother   . Healthy Brother    Allergies  Allergen Reactions  . Penicillin G Rash   Prior to Admission medications   Medication Sig Start Date End Date Taking? Authorizing Provider  amphetamine-dextroamphetamine (ADDERALL XR) 20 MG 24 hr capsule Take by mouth. 10/13/18  Yes [provider]  Certolizumab Pegol (CIMZIA [place]) Inject into the skin every 14 (fourteen) days.   Yes [provider]  clotrimazole-betamethasone (LOTRISONE) cream Apply to affected area twice daily 10/09/18  Yes [provider]  escitalopram (LEXAPRO) 10 MG tablet Take 10 mg by mouth daily.   Yes [provider]  HYDROcodone-acetaminophen (NORCO/VICODIN) 5-325 MG tablet Take 2 tablets by mouth every 4 (four) hours as needed. 12/24/18  Yes Henderly, Britni A, PA-C  levofloxacin (LEVAQUIN) 250 MG tablet Take 250 mg by mouth daily.   Yes [provider]  norethindrone-ethinyl estradiol (JUNEL  1/20) 1-20 MG-MCG tablet TAKE 1 TABLET BY MOUTH EVERY DAY 08/17/18  Yes [provider]     Positive ROS: Otherwise negative  All other systems have been reviewed and were otherwise negative with the exception of those mentioned in the HPI and as above.  Physical Exam: Constitutional: Alert, well-appearing, no acute distress Ears: External ears without lesions or tenderness.  She has drainage from both ear canals with inflammation of the concha area of both ears.  On microscopic exam most of the inflammatory changes on the left side or more lateral.  On the right side she has slight inflammation extending more medially.  After cleaning the right ear canal with suction I  applied gentian violet and Ciprodex.  This was also applied to the left side along with mupirocin 2% ointment.  Hearing screening with tuning forks revealed good hearing in both ears which was symmetric. Nasal: External nose without lesions. Septum relatively midline.. Clear nasal passages.  No signs of infection. Oral: Lips and gums without lesions. Tongue and palate mucosa without lesions. Posterior oropharynx clear. Neck: No palpable adenopathy or masses Respiratory: Breathing comfortably  Skin: No facial/neck lesions.  She has some psoriatic changes behind the left ear.  Cerumen impaction removal  Date/Time: 01/26/2019 4:46 PM Performed by: Rozetta Nunnery, MD Authorized by: Rozetta Nunnery, MD   Consent:    Consent obtained:  Verbal   Consent given by:  Patient   Risks discussed:  Pain and bleeding Procedure details:    Location:  L ear and R ear   Procedure type: irrigation and suction   Post-procedure details:    Inspection:  TM intact   Hearing quality:  Improved   Patient tolerance of procedure:  Tolerated well, no immediate complications Comments:     Both TMs were clear and intact.  Head inflammatory changes on the right side extending more medially.  Inflammatory changes on the left side were much more lateral.    Assessment: Chronic bilateral external otitis.  Plan: After cleaning the ear canals I applied gentian violet and Ciprodex in both sides.  Also applied mupirocin 2% ointment on the left side which was little more sore. Prescribed Diprolene 0.05% cream to apply to itchy crusty areas.  And I prescribed mupirocin 2% ointment to apply to sore areas. Prescribed Cortisporin otic suspension drops to use if she has pain or discomfort deep within the ear canal. Also recommended discussion with her dermatologist concerning any creams that might be effective for psoriasis.  Radene Journey, MD

## 2019-02-06 ENCOUNTER — Ambulatory Visit (INDEPENDENT_AMBULATORY_CARE_PROVIDER_SITE_OTHER): Payer: BC Managed Care – PPO | Admitting: Otolaryngology

## 2019-02-06 ENCOUNTER — Other Ambulatory Visit: Payer: Self-pay

## 2019-02-06 VITALS — Temp 97.7°F

## 2019-02-06 DIAGNOSIS — H60331 Swimmer's ear, right ear: Secondary | ICD-10-CM

## 2019-02-06 NOTE — Progress Notes (Signed)
HPI: Evelyn Goodwin is a 24 y.o. female who returns today for evaluation of ear pain and discomfort.  She was seen 3 weeks ago with external otitis and treated with cefdinir as well as eardrops although she was unable to get the eardrops because of pharmacy not having them available.  She has been using a nasal steroid spray Nasacort to help with congestion in her nose.  Previously she had more pain in the left ear today she has more pain on the right side. This apparently just started about a month ago.  At times she has noticed some blockage of her hearing.  Past Medical History:  Diagnosis Date  . Anxiety   . Depression   . Psoriasis   . Scoliosis    Past Surgical History:  Procedure Laterality Date  . WISDOM TOOTH EXTRACTION  2014   Social History   Socioeconomic History  . Marital status: Single    Spouse name: Not on file  . Number of children: Not on file  . Years of education: Not on file  . Highest education level: Not on file  Occupational History  . Not on file  Tobacco Use  . Smoking status: Never Smoker  . Smokeless tobacco: Never Used  Substance and Sexual Activity  . Alcohol use: Not Currently  . Drug use: Not Currently  . Sexual activity: Not on file  Other Topics Concern  . Not on file  Social History Narrative  . Not on file   Social Determinants of Health   Financial Resource Strain:   . Difficulty of Paying Living Expenses: Not on file  Food Insecurity:   . Worried About Charity fundraiser in the Last Year: Not on file  . Ran Out of Food in the Last Year: Not on file  Transportation Needs:   . Lack of Transportation (Medical): Not on file  . Lack of Transportation (Non-Medical): Not on file  Physical Activity:   . Days of Exercise per Week: Not on file  . Minutes of Exercise per Session: Not on file  Stress:   . Feeling of Stress : Not on file  Social Connections:   . Frequency of Communication with Friends and Family: Not on file  .  Frequency of Social Gatherings with Friends and Family: Not on file  . Attends Religious Services: Not on file  . Active Member of Clubs or Organizations: Not on file  . Attends Archivist Meetings: Not on file  . Marital Status: Not on file   Family History  Problem Relation Age of Onset  . Psoriasis Mother   . Healthy Brother    Allergies  Allergen Reactions  . Penicillin G Rash   Prior to Admission medications   Medication Sig Start Date End Date Taking? Authorizing Provider  amphetamine-dextroamphetamine (ADDERALL XR) 20 MG 24 hr capsule Take by mouth. 10/13/18  Yes [provider]  Certolizumab Pegol (CIMZIA [place]) Inject into the skin every 14 (fourteen) days.   Yes [provider]  clotrimazole-betamethasone (LOTRISONE) cream Apply to affected area twice daily 10/09/18  Yes [provider]  escitalopram (LEXAPRO) 10 MG tablet Take 10 mg by mouth daily.   Yes [provider]  HYDROcodone-acetaminophen (NORCO/VICODIN) 5-325 MG tablet Take 2 tablets by mouth every 4 (four) hours as needed. 12/24/18  Yes Henderly, Britni A, PA-C  levofloxacin (LEVAQUIN) 250 MG tablet Take 250 mg by mouth daily.   Yes [provider]  norethindrone-ethinyl estradiol (JUNEL 1/20)  1-20 MG-MCG tablet TAKE 1 TABLET BY MOUTH EVERY DAY 08/17/18  Yes [provider]     Positive ROS: Otherwise negative  All other systems have been reviewed and were otherwise negative with the exception of those mentioned in the HPI and as above.  Physical Exam: Constitutional: Alert, well-appearing, no acute distress Ears: External ears without lesions or tenderness. Ear canals reveal some crusting on the lateral portion of the left ear canal but no real pain or discomfort.  The TM itself was clear with good mobility pneumatic otoscopy.  The right ear canal revealed diffuse inflammation swelling and pain consistent with acute right otitis externa.  The TM itself was  clear.  On tuning fork testing AC > BC bilaterally with symmetric hearing in both ears. Nasal: External nose without lesions. Septum relatively midline with mild rhinitis.. Clear nasal passages no signs of infection. Oral: Lips and gums without lesions. Tongue and palate mucosa without lesions. Posterior oropharynx clear. Neck: No palpable adenopathy or masses Respiratory: Breathing comfortably  Skin: No facial/neck lesions or rash noted.  Procedures  Assessment: Otitis externa  Plan: She inquired about possible surgery but discussed with her that the ear infections are more external of the ear canal. I applied gentian violet Ciprodex to both ear canals. Recommended using the Cortisporin otic suspension drops and also prescribed Cipro 500 mg twice daily for 1 week. She will follow-up next week for recheck.   Radene Journey, MD

## 2019-02-07 ENCOUNTER — Ambulatory Visit (INDEPENDENT_AMBULATORY_CARE_PROVIDER_SITE_OTHER): Payer: BC Managed Care – PPO | Admitting: Cardiology

## 2019-02-07 ENCOUNTER — Telehealth: Payer: Self-pay | Admitting: *Deleted

## 2019-02-07 ENCOUNTER — Other Ambulatory Visit: Payer: Self-pay

## 2019-02-07 ENCOUNTER — Encounter: Payer: Self-pay | Admitting: Cardiology

## 2019-02-07 VITALS — BP 110/74 | HR 96 | Temp 97.7°F | Ht 67.0 in | Wt 118.0 lb

## 2019-02-07 DIAGNOSIS — R002 Palpitations: Secondary | ICD-10-CM

## 2019-02-07 NOTE — Progress Notes (Signed)
Referring-Kevin Elmore, PA-C Reason for referral-palpitations  HPI: 24 year old female for evaluation of palpitations at request of Sherrilee Gilles.  CT November 2020 showed no pulmonary embolus.  Laboratories November 2020 showed potassium 3.7.  Rapid urine drug screen December 2020 negative.  TSH December 2020 1.464.  Patient states for the past 1 month she has had intermittent palpitations described as her heart racing for 5 to 8 seconds.  These occur suddenly and are not associated with activities.  No associated symptoms.  She otherwise denies dyspnea on exertion, orthopnea, PND, pedal edema, chest pain or syncope.  Cardiology now asked to evaluate.  Current Outpatient Medications  Medication Sig Dispense Refill  . amphetamine-dextroamphetamine (ADDERALL XR) 20 MG 24 hr capsule Take by mouth.    . Certolizumab Pegol (CIMZIA [place]) Inject into the skin every 14 (fourteen) days.    . clotrimazole-betamethasone (LOTRISONE) cream Apply to affected area twice daily    . escitalopram (LEXAPRO) 10 MG tablet Take 10 mg by mouth daily.    Marland Kitchen HYDROcodone-acetaminophen (NORCO/VICODIN) 5-325 MG tablet Take 2 tablets by mouth every 4 (four) hours as needed. 6 tablet 0  . levofloxacin (LEVAQUIN) 250 MG tablet Take 250 mg by mouth daily.    . norethindrone-ethinyl estradiol (JUNEL 1/20) 1-20 MG-MCG tablet TAKE 1 TABLET BY MOUTH EVERY DAY     No current facility-administered medications for this visit.    Allergies  Allergen Reactions  . Penicillin G Rash     Past Medical History:  Diagnosis Date  . Anxiety   . Depression   . Psoriasis   . Scoliosis     Past Surgical History:  Procedure Laterality Date  . WISDOM TOOTH EXTRACTION  2014    Social History   Socioeconomic History  . Marital status: Single    Spouse name: Not on file  . Number of children: Not on file  . Years of education: Not on file  . Highest education level: Not on file  Occupational History  . Not on file    Tobacco Use  . Smoking status: Never Smoker  . Smokeless tobacco: Never Used  Substance and Sexual Activity  . Alcohol use: Yes    Comment: Rare  . Drug use: Not Currently  . Sexual activity: Not on file  Other Topics Concern  . Not on file  Social History Narrative  . Not on file   Social Determinants of Health   Financial Resource Strain:   . Difficulty of Paying Living Expenses: Not on file  Food Insecurity:   . Worried About Charity fundraiser in the Last Year: Not on file  . Ran Out of Food in the Last Year: Not on file  Transportation Needs:   . Lack of Transportation (Medical): Not on file  . Lack of Transportation (Non-Medical): Not on file  Physical Activity:   . Days of Exercise per Week: Not on file  . Minutes of Exercise per Session: Not on file  Stress:   . Feeling of Stress : Not on file  Social Connections:   . Frequency of Communication with Friends and Family: Not on file  . Frequency of Social Gatherings with Friends and Family: Not on file  . Attends Religious Services: Not on file  . Active Member of Clubs or Organizations: Not on file  . Attends Archivist Meetings: Not on file  . Marital Status: Not on file  Intimate Partner Violence:   . Fear of Current or Ex-Partner:  Not on file  . Emotionally Abused: Not on file  . Physically Abused: Not on file  . Sexually Abused: Not on file    Family History  Problem Relation Age of Onset  . Psoriasis Mother   . Healthy Brother     ROS: no fevers or chills, productive cough, hemoptysis, dysphasia, odynophagia, melena, hematochezia, dysuria, hematuria, rash, seizure activity, orthopnea, PND, pedal edema, claudication. Remaining systems are negative.  Physical Exam:   Blood pressure 110/74, pulse 96, temperature 97.7 F (36.5 C), height 5' 7"  (1.702 m), weight 118 lb (53.5 kg).  General:  Well developed/well nourished in NAD Skin warm/dry Patient not depressed No peripheral  clubbing Back-normal HEENT-normal/normal eyelids Neck supple/normal carotid upstroke bilaterally; no bruits; no JVD; no thyromegaly chest - CTA/ normal expansion CV - RRR/normal S1 and S2; no murmurs, rubs or gallops;  PMI nondisplaced Abdomen -NT/ND, no HSM, no mass, + bowel sounds, no bruit 2+ femoral pulses, no bruits Ext-no edema, chords, 2+ DP Neuro-grossly nonfocal  ECG -December 25, 2018-sinus rhythm with RV conduction delay.  Personally reviewed  A/P  1 Palpitations-etiology unclear.  Question SVT.  We will arrange an event monitor to further assess.  I will arrange an echocardiogram to assess LV function.  Recent TSH normal.  Can add beta-blockade later if needed.  2 psoriasis-Per primary care.  Kirk Ruths, MD

## 2019-02-07 NOTE — Patient Instructions (Signed)
Medication Instructions:  NO CHANGE *If you need a refill on your cardiac medications before your next appointment, please call your pharmacy*  Lab Work: If you have labs (blood work) drawn today and your tests are completely normal, you will receive your results only by: Marland Kitchen MyChart Message (if you have MyChart) OR . A paper copy in the mail If you have any lab test that is abnormal or we need to change your treatment, we will call you to review the results.  Testing/Procedures: Your physician has requested that you have an echocardiogram. Echocardiography is a painless test that uses sound waves to create images of your heart. It provides your doctor with information about the size and shape of your heart and how well your heart's chambers and valves are working. This procedure takes approximately one hour. There are no restrictions for this procedure.[employer] has recommended that you wear a 30 DAY event monitor. Event monitors are medical devices that record the heart's electrical activity. Doctors most often [place] these monitors to diagnose arrhythmias. Arrhythmias are problems with the speed or rhythm of the heartbeat. The monitor is a small, portable device. You can wear one while you do your normal daily activities. This is usually used to diagnose what is causing palpitations/syncope (passing out).   Follow-Up: At [facility], you and your health needs are our priority.  As part of our continuing mission to provide you with exceptional heart care, we have created designated Provider Care Teams.  These Care Teams include your primary Cardiologist (physician) and Advanced Practice Providers (APPs -  Physician Assistants and Nurse Practitioners) who all work together to provide you with the care you need, when you need it.  Your next appointment:   3 month(s)  The format for your next appointment:   Either In Person or Virtual  Provider:   Kirk Ruths,  MD

## 2019-02-07 NOTE — Telephone Encounter (Signed)
Called to inform patient she has been enrolled for [employer] to ship a 30 day cardiac event monitor to her home. No answer, voice mail box full.

## 2019-02-13 ENCOUNTER — Ambulatory Visit (INDEPENDENT_AMBULATORY_CARE_PROVIDER_SITE_OTHER): Payer: BC Managed Care – PPO

## 2019-02-13 ENCOUNTER — Ambulatory Visit (INDEPENDENT_AMBULATORY_CARE_PROVIDER_SITE_OTHER): Payer: BC Managed Care – PPO | Admitting: Otolaryngology

## 2019-02-13 DIAGNOSIS — R002 Palpitations: Secondary | ICD-10-CM | POA: Diagnosis not present

## 2019-02-19 ENCOUNTER — Ambulatory Visit (HOSPITAL_COMMUNITY): Payer: BC Managed Care – PPO | Attending: Cardiology

## 2019-02-19 ENCOUNTER — Other Ambulatory Visit: Payer: Self-pay

## 2019-02-19 DIAGNOSIS — R002 Palpitations: Secondary | ICD-10-CM | POA: Insufficient documentation

## 2019-03-05 ENCOUNTER — Telehealth: Payer: Self-pay | Admitting: Cardiology

## 2019-03-05 NOTE — Telephone Encounter (Signed)
Patient c/o Palpitations:  High priority if patient c/o lightheadedness, shortness of breath, or chest pain  1) How long have you had palpitations/irregular HR/ Afib? Are you having the symptoms now? Yes some and the patient states every now and then  2) Are you currently experiencing lightheadedness, SOB or CP? No   3) Do you have a history of afib (atrial fibrillation) or irregular heart rhythm? No   4) Have you checked your BP or HR? (document readings if available) HR 104 BP normal  5) Are you experiencing any other symptoms? No  Patient would like to see the doctor sooner

## 2019-03-05 NOTE — Telephone Encounter (Signed)
Spoke with patient. Patient reports she has anxiety and she's noticing more palpitations. Patient not experiencing other symptoms. Patient requests to see Dr. Stanford Breed sooner than April, this month if possible. Patient scheduled for 03/12/19.

## 2019-03-06 NOTE — Progress Notes (Signed)
HPI: Follow-up palpitations. CT November 2020 showed no pulmonary embolus.  Laboratories November 2020 showed potassium 3.7.  Rapid urine drug screen December 2020 negative.  TSH December 2020 1.464.  Echocardiogram February 2021 showed normal LV function.  Monitor February 2021 showed.  Since last seen there is no dyspnea on exertion, orthopnea, PND, pedal edema, exertional chest pain or syncope.  Her palpitations have worsened.  They can last 30 seconds at a time.  No associated syncope.  Current Outpatient Medications  Medication Sig Dispense Refill  . amphetamine-dextroamphetamine (ADDERALL XR) 20 MG 24 hr capsule Take by mouth.    . busPIRone (BUSPAR) 7.5 MG tablet Take 7.5 mg by mouth 2 (two) times daily.    . Certolizumab Pegol (CIMZIA [place]) Inject into the skin every 14 (fourteen) days.    . clotrimazole-betamethasone (LOTRISONE) cream Apply to affected area twice daily    . escitalopram (LEXAPRO) 10 MG tablet Take 10 mg by mouth daily.    Marland Kitchen HYDROcodone-acetaminophen (NORCO/VICODIN) 5-325 MG tablet Take 2 tablets by mouth every 4 (four) hours as needed. 6 tablet 0  . levofloxacin (LEVAQUIN) 250 MG tablet Take 250 mg by mouth daily.    . norethindrone-ethinyl estradiol (JUNEL 1/20) 1-20 MG-MCG tablet TAKE 1 TABLET BY MOUTH EVERY DAY     No current facility-administered medications for this visit.     Past Medical History:  Diagnosis Date  . Anxiety   . Depression   . Psoriasis   . Scoliosis     Past Surgical History:  Procedure Laterality Date  . WISDOM TOOTH EXTRACTION  2014    Social History   Socioeconomic History  . Marital status: Single    Spouse name: Not on file  . Number of children: Not on file  . Years of education: Not on file  . Highest education level: Not on file  Occupational History  . Not on file  Tobacco Use  . Smoking status: Never Smoker  . Smokeless tobacco: Never Used  Substance and Sexual Activity  . Alcohol use: Yes    Comment: Rare   . Drug use: Not Currently  . Sexual activity: Not on file  Other Topics Concern  . Not on file  Social History Narrative  . Not on file   Social Determinants of Health   Financial Resource Strain:   . Difficulty of Paying Living Expenses: Not on file  Food Insecurity:   . Worried About Charity fundraiser in the Last Year: Not on file  . Ran Out of Food in the Last Year: Not on file  Transportation Needs:   . Lack of Transportation (Medical): Not on file  . Lack of Transportation (Non-Medical): Not on file  Physical Activity:   . Days of Exercise per Week: Not on file  . Minutes of Exercise per Session: Not on file  Stress:   . Feeling of Stress : Not on file  Social Connections:   . Frequency of Communication with Friends and Family: Not on file  . Frequency of Social Gatherings with Friends and Family: Not on file  . Attends Religious Services: Not on file  . Active Member of Clubs or Organizations: Not on file  . Attends Archivist Meetings: Not on file  . Marital Status: Not on file  Intimate Partner Violence:   . Fear of Current or Ex-Partner: Not on file  . Emotionally Abused: Not on file  . Physically Abused: Not on file  .  Sexually Abused: Not on file    Family History  Problem Relation Age of Onset  . Psoriasis Mother   . Healthy Brother     ROS: no fevers or chills, productive cough, hemoptysis, dysphasia, odynophagia, melena, hematochezia, dysuria, hematuria, rash, seizure activity, orthopnea, PND, pedal edema, claudication. Remaining systems are negative.  Physical Exam: Well-developed well-nourished in no acute distress.  Skin is warm and dry.  HEENT is normal.  Neck is supple.  Chest is clear to auscultation with normal expansion.  Cardiovascular exam is regular rate and rhythm.  Abdominal exam nontender or distended. No masses palpated. Extremities show no edema. neuro grossly intact  ECG-sinus rhythm at a rate of 93, biatrial  enlargement.  Personally reviewed  A/P  1 palpitations-symptoms have worsened.  Monitor is pending.  LV function is normal.  We will add Toprol 25 mg nightly for symptomatic relief.  Await follow-up monitor results.  2 psoriasis-Per primary care.  Kirk Ruths, MD

## 2019-03-12 ENCOUNTER — Ambulatory Visit (INDEPENDENT_AMBULATORY_CARE_PROVIDER_SITE_OTHER): Payer: BC Managed Care – PPO | Admitting: Cardiology

## 2019-03-12 ENCOUNTER — Encounter: Payer: Self-pay | Admitting: Cardiology

## 2019-03-12 ENCOUNTER — Other Ambulatory Visit: Payer: Self-pay

## 2019-03-12 VITALS — BP 106/80 | HR 97 | Temp 98.3°F | Ht 67.0 in | Wt 123.0 lb

## 2019-03-12 DIAGNOSIS — R002 Palpitations: Secondary | ICD-10-CM

## 2019-03-12 MED ORDER — METOPROLOL SUCCINATE ER 25 MG PO TB24: 25.0000 mg | ORAL_TABLET | Freq: Every day | ORAL | 3 refills | Status: DC

## 2019-03-12 NOTE — Patient Instructions (Signed)
Medication Instructions:  START METOPROLOL 25 MG ONCE DAILY AT BEDTIME  *If you need a refill on your cardiac medications before your next appointment, please call your pharmacy*  Lab Work: If you have labs (blood work) drawn today and your tests are completely normal, you will receive your results only by: Marland Kitchen MyChart Message (if you have MyChart) OR . A paper copy in the mail If you have any lab test that is abnormal or we need to change your treatment, we will call you to review the results.  Follow-Up: At [facility], you and your health needs are our priority.  As part of our continuing mission to provide you with exceptional heart care, we have created designated Provider Care Teams.  These Care Teams include your primary Cardiologist (physician) and Advanced Practice Providers (APPs -  Physician Assistants and Nurse Practitioners) who all work together to provide you with the care you need, when you need it.  Your next appointment:   AS SCHEDULED

## 2019-03-16 ENCOUNTER — Telehealth: Payer: Self-pay | Admitting: Cardiology

## 2019-03-16 NOTE — Telephone Encounter (Signed)
Called patient to inform her she needed to come back into the office and pick up heart monitor that she left to return for dr. Stanford Breed. Informed her she needed to mail it per instructions from triage.

## 2019-04-06 ENCOUNTER — Other Ambulatory Visit: Payer: Self-pay

## 2019-04-06 ENCOUNTER — Telehealth: Payer: Self-pay | Admitting: Dermatology

## 2019-04-06 MED ORDER — HALOBETASOL PROPIONATE 0.05 % EX FOAM: 1.0000 "application " | Freq: Every day | CUTANEOUS | 2 refills | Status: DC

## 2019-04-06 NOTE — Telephone Encounter (Signed)
Patient wants refill on Halobetasol Foam 0.05. Said she did not get an rx at last visit with ST.  Uses [facility] in [place] [phone].  Chart # [id number]

## 2019-04-06 NOTE — Telephone Encounter (Signed)
Rx for Halobetasol Foam sent into patient's Pharmacy.  Phone call to patient to let her know the Rx was sent to her Pharmacy.

## 2019-04-12 NOTE — Telephone Encounter (Signed)
Patient calling because the pharmacy states that the "requisition" for halobetasol was sent back to our office and we have to submit a new PA ([facility] [place]) Per patient if she does not answer please call emergency contact Corene Cornea)

## 2019-04-12 NOTE — Telephone Encounter (Signed)
Prior Authorization for Halobetasol Foam done through [employer]. Approved today Case [id number] Status:Approved;Review Type:Prior Auth;Coverage Start Date:03/13/2019;Coverage End Date:04/11/2020;   Phone call to patient to inform her that the Prior Authorization for Halobetasol Foam was approved.  Patient did not answer and her voicemail is full.

## 2019-04-16 NOTE — Telephone Encounter (Signed)
Phone call to patient to inform her that her Prior Authorization for Halobetasol was approved.  Patient aware.

## 2019-04-18 ENCOUNTER — Telehealth: Payer: Self-pay | Admitting: *Deleted

## 2019-04-18 ENCOUNTER — Other Ambulatory Visit: Payer: Self-pay | Admitting: Dermatology

## 2019-04-18 NOTE — Telephone Encounter (Signed)
Halobetasol 0.05% foam approved through 03/13/2019

## 2019-04-19 ENCOUNTER — Other Ambulatory Visit: Payer: Self-pay | Admitting: Physician Assistant

## 2019-04-19 ENCOUNTER — Other Ambulatory Visit: Payer: Self-pay

## 2019-04-19 ENCOUNTER — Ambulatory Visit
Admission: RE | Admit: 2019-04-19 | Discharge: 2019-04-19 | Disposition: A | Discharge status: Home / Self Care | Payer: BC Managed Care – PPO | Source: Ambulatory Visit | Attending: Physician Assistant | Admitting: Physician Assistant

## 2019-04-19 DIAGNOSIS — R52 Pain, unspecified: Secondary | ICD-10-CM

## 2019-04-19 NOTE — Telephone Encounter (Signed)
Per Dr.Tafeen ok to switch patients clobetasol ointment to Halobetasol Cream since insurance didn't cover the clobetasol. Sent to Patients pharmacy.

## 2019-04-19 NOTE — Telephone Encounter (Signed)
OK halobetasol cream, 45-60gm, 1RF. Dr. Darene Lamer

## 2019-04-24 ENCOUNTER — Other Ambulatory Visit (HOSPITAL_COMMUNITY): Payer: Self-pay | Admitting: Physician Assistant

## 2019-04-24 ENCOUNTER — Other Ambulatory Visit: Payer: Self-pay | Admitting: Physician Assistant

## 2019-04-24 DIAGNOSIS — R197 Diarrhea, unspecified: Secondary | ICD-10-CM

## 2019-04-24 DIAGNOSIS — R6881 Early satiety: Secondary | ICD-10-CM

## 2019-04-24 DIAGNOSIS — R0789 Other chest pain: Secondary | ICD-10-CM

## 2019-04-24 DIAGNOSIS — R1013 Epigastric pain: Secondary | ICD-10-CM

## 2019-04-30 ENCOUNTER — Ambulatory Visit (HOSPITAL_COMMUNITY)
Admission: RE | Admit: 2019-04-30 | Discharge: 2019-04-30 | Disposition: A | Discharge status: Home / Self Care | Payer: BC Managed Care – PPO | Source: Ambulatory Visit | Attending: Physician Assistant | Admitting: Physician Assistant

## 2019-04-30 ENCOUNTER — Other Ambulatory Visit: Payer: Self-pay

## 2019-04-30 ENCOUNTER — Inpatient Hospital Stay (HOSPITAL_COMMUNITY)
Admission: EM | Admit: 2019-04-30 | Discharge: 2019-05-03 | DRG: 340 | Disposition: A | Discharge status: Home / Self Care | Payer: BC Managed Care – PPO | Attending: General Surgery | Admitting: General Surgery

## 2019-04-30 ENCOUNTER — Encounter (HOSPITAL_COMMUNITY): Payer: Self-pay | Admitting: Emergency Medicine

## 2019-04-30 DIAGNOSIS — Z20822 Contact with and (suspected) exposure to covid-19: Secondary | ICD-10-CM | POA: Diagnosis present

## 2019-04-30 DIAGNOSIS — M4126 Other idiopathic scoliosis, lumbar region: Secondary | ICD-10-CM | POA: Diagnosis present

## 2019-04-30 DIAGNOSIS — K3533 Acute appendicitis with perforation and localized peritonitis, with abscess: Principal | ICD-10-CM | POA: Diagnosis present

## 2019-04-30 DIAGNOSIS — R6881 Early satiety: Secondary | ICD-10-CM

## 2019-04-30 DIAGNOSIS — K3532 Acute appendicitis with perforation and localized peritonitis, without abscess: Secondary | ICD-10-CM | POA: Diagnosis present

## 2019-04-30 DIAGNOSIS — F419 Anxiety disorder, unspecified: Secondary | ICD-10-CM | POA: Diagnosis present

## 2019-04-30 DIAGNOSIS — R000 Tachycardia, unspecified: Secondary | ICD-10-CM | POA: Diagnosis present

## 2019-04-30 DIAGNOSIS — Z79899 Other long term (current) drug therapy: Secondary | ICD-10-CM | POA: Diagnosis not present

## 2019-04-30 DIAGNOSIS — R0789 Other chest pain: Secondary | ICD-10-CM | POA: Insufficient documentation

## 2019-04-30 DIAGNOSIS — R197 Diarrhea, unspecified: Secondary | ICD-10-CM | POA: Insufficient documentation

## 2019-04-30 DIAGNOSIS — R1013 Epigastric pain: Secondary | ICD-10-CM

## 2019-04-30 DIAGNOSIS — F329 Major depressive disorder, single episode, unspecified: Secondary | ICD-10-CM | POA: Diagnosis present

## 2019-04-30 DIAGNOSIS — F909 Attention-deficit hyperactivity disorder, unspecified type: Secondary | ICD-10-CM | POA: Diagnosis present

## 2019-04-30 DIAGNOSIS — D72829 Elevated white blood cell count, unspecified: Secondary | ICD-10-CM | POA: Diagnosis present

## 2019-04-30 DIAGNOSIS — I10 Essential (primary) hypertension: Secondary | ICD-10-CM | POA: Diagnosis present

## 2019-04-30 LAB — COMPREHENSIVE METABOLIC PANEL
ALT: 20 U/L (ref 0–44)
AST: 17 U/L (ref 15–41)
Albumin: 3.8 g/dL (ref 3.5–5.0)
Alkaline Phosphatase: 69 U/L (ref 38–126)
Anion gap: 11 (ref 5–15)
BUN: 8 mg/dL (ref 6–20)
CO2: 24 mmol/L (ref 22–32)
Calcium: 9.2 mg/dL (ref 8.9–10.3)
Chloride: 106 mmol/L (ref 98–111)
Creatinine, Ser: 0.72 mg/dL (ref 0.44–1.00)
GFR calc Af Amer: >60 mL/min (ref >60)
GFR calc non Af Amer: >60 mL/min (ref >60)
Glucose, Bld: 108 mg/dL — ABNORMAL HIGH (ref 70–99)
Potassium: 3.7 mmol/L (ref 3.5–5.1)
Sodium: 141 mmol/L (ref 135–145)
Total Bilirubin: 0.4 mg/dL (ref 0.3–1.2)
Total Protein: 8.5 g/dL — ABNORMAL HIGH (ref 6.5–8.1)

## 2019-04-30 LAB — URINALYSIS, ROUTINE W REFLEX MICROSCOPIC
Bilirubin Urine: NEGATIVE
Glucose, UA: NEGATIVE mg/dL
Hgb urine dipstick: NEGATIVE
Ketones, ur: NEGATIVE mg/dL
Nitrite: NEGATIVE
Protein, ur: NEGATIVE mg/dL
Specific Gravity, Urine: 1.035 — ABNORMAL HIGH (ref 1.005–1.030)
pH: 5 (ref 5.0–8.0)

## 2019-04-30 LAB — I-STAT BETA HCG BLOOD, ED (MC, WL, AP ONLY): I-stat hCG, quantitative: <5 m[IU]/mL (ref <5)

## 2019-04-30 LAB — CBC
HCT: 42.5 % (ref 36.0–46.0)
Hemoglobin: 13.4 g/dL (ref 12.0–15.0)
MCH: 27 pg (ref 26.0–34.0)
MCHC: 31.5 g/dL (ref 30.0–36.0)
MCV: 85.5 fL (ref 80.0–100.0)
Platelets: 523 10*3/uL — ABNORMAL HIGH (ref 150–400)
RBC: 4.97 MIL/uL (ref 3.87–5.11)
RDW: 14.5 % (ref 11.5–15.5)
WBC: 10.6 10*3/uL — ABNORMAL HIGH (ref 4.0–10.5)
nRBC: 0 % (ref 0.0–0.2)

## 2019-04-30 LAB — LIPASE, BLOOD: Lipase: 39 U/L (ref 11–51)

## 2019-04-30 MED ORDER — ESCITALOPRAM OXALATE 10 MG PO TABS
10.0000 mg | ORAL_TABLET | Freq: Every day | ORAL | Status: DC
  Administered 2019-05-01 – 2019-05-03 (×3): 10 mg via ORAL
  Filled 2019-04-30 (×3): qty 1

## 2019-04-30 MED ORDER — AMPHETAMINE-DEXTROAMPHET ER 10 MG PO CP24
20.0000 mg | ORAL_CAPSULE | Freq: Every day | ORAL | Status: DC
  Administered 2019-05-01 – 2019-05-02 (×2): 20 mg via ORAL
  Filled 2019-04-30 (×3): qty 2

## 2019-04-30 MED ORDER — BUSPIRONE HCL 15 MG PO TABS
7.5000 mg | ORAL_TABLET | Freq: Two times a day (BID) | ORAL | Status: DC
  Administered 2019-04-30 – 2019-05-03 (×6): 7.5 mg via ORAL
  Filled 2019-04-30 (×6): qty 1

## 2019-04-30 MED ORDER — ONDANSETRON 4 MG PO TBDP
4.0000 mg | ORAL_TABLET | Freq: Four times a day (QID) | ORAL | Status: DC | PRN
  Administered 2019-05-01: 17:00:00 4 mg via ORAL

## 2019-04-30 MED ORDER — ONDANSETRON HCL 4 MG/2ML IJ SOLN
4.0000 mg | Freq: Four times a day (QID) | INTRAMUSCULAR | Status: DC | PRN
  Administered 2019-05-01 – 2019-05-03 (×6): 4 mg via INTRAVENOUS
  Filled 2019-04-30 (×8): qty 2

## 2019-04-30 MED ORDER — SODIUM CHLORIDE 0.9% FLUSH
3.0000 mL | Freq: Once | INTRAVENOUS | Status: AC
  Administered 2019-04-30: 3 mL via INTRAVENOUS

## 2019-04-30 MED ORDER — SODIUM CHLORIDE 0.9 % IV BOLUS
1000.0000 mL | Freq: Once | INTRAVENOUS | Status: AC
  Administered 2019-04-30: 1000 mL via INTRAVENOUS

## 2019-04-30 MED ORDER — LORAZEPAM 2 MG/ML IJ SOLN
1.0000 mg | Freq: Once | INTRAMUSCULAR | Status: AC
  Administered 2019-04-30: 0.5 mg via INTRAVENOUS
  Filled 2019-04-30: qty 1

## 2019-04-30 MED ORDER — POTASSIUM CHLORIDE IN NACL 20-0.9 MEQ/L-% IV SOLN
INTRAVENOUS | Status: DC
  Filled 2019-04-30 (×5): qty 1000

## 2019-04-30 MED ORDER — ACETAMINOPHEN 650 MG RE SUPP: 650.0000 mg | Freq: Four times a day (QID) | RECTAL | Status: DC | PRN

## 2019-04-30 MED ORDER — METOPROLOL SUCCINATE ER 25 MG PO TB24
25.0000 mg | ORAL_TABLET | Freq: Every day | ORAL | Status: DC
  Administered 2019-05-02: 25 mg via ORAL
  Filled 2019-04-30 (×2): qty 1

## 2019-04-30 MED ORDER — ZOLPIDEM TARTRATE 5 MG PO TABS
5.0000 mg | ORAL_TABLET | Freq: Every evening | ORAL | Status: DC | PRN
  Administered 2019-04-30 – 2019-05-02 (×3): 5 mg via ORAL
  Filled 2019-04-30 (×3): qty 1

## 2019-04-30 MED ORDER — SODIUM CHLORIDE 0.9 % IV SOLN
2.0000 g | Freq: Once | INTRAVENOUS | Status: AC
  Administered 2019-04-30: 2 g via INTRAVENOUS
  Filled 2019-04-30: qty 20

## 2019-04-30 MED ORDER — OXYCODONE HCL 5 MG PO TABS
5.0000 mg | ORAL_TABLET | ORAL | Status: DC | PRN
  Administered 2019-05-01: 08:00:00 5 mg via ORAL
  Administered 2019-05-01 – 2019-05-02 (×5): 10 mg via ORAL
  Filled 2019-04-30 (×2): qty 1
  Filled 2019-04-30 (×6): qty 2

## 2019-04-30 MED ORDER — MORPHINE SULFATE (PF) 4 MG/ML IV SOLN
4.0000 mg | INTRAVENOUS | Status: DC | PRN
  Administered 2019-05-01 – 2019-05-02 (×5): 4 mg via INTRAVENOUS
  Filled 2019-04-30 (×6): qty 1

## 2019-04-30 MED ORDER — IOHEXOL 300 MG/ML  SOLN
75.0000 mL | Freq: Once | INTRAMUSCULAR | Status: AC | PRN
  Administered 2019-04-30: 75 mL via INTRAVENOUS

## 2019-04-30 MED ORDER — METRONIDAZOLE IN NACL 5-0.79 MG/ML-% IV SOLN
500.0000 mg | Freq: Once | INTRAVENOUS | Status: AC
  Administered 2019-04-30: 500 mg via INTRAVENOUS
  Filled 2019-04-30: qty 100

## 2019-04-30 MED ORDER — METRONIDAZOLE IN NACL 5-0.79 MG/ML-% IV SOLN
500.0000 mg | Freq: Three times a day (TID) | INTRAVENOUS | Status: DC
  Administered 2019-05-01 – 2019-05-03 (×7): 500 mg via INTRAVENOUS
  Filled 2019-04-30 (×7): qty 100

## 2019-04-30 MED ORDER — ACETAMINOPHEN 325 MG PO TABS: 650.0000 mg | ORAL_TABLET | Freq: Four times a day (QID) | ORAL | Status: DC | PRN

## 2019-04-30 MED ORDER — SODIUM CHLORIDE 0.9 % IV SOLN
2.0000 g | INTRAVENOUS | Status: DC
  Administered 2019-05-01 – 2019-05-02 (×2): 2 g via INTRAVENOUS
  Filled 2019-04-30: qty 20
  Filled 2019-04-30 (×2): qty 2

## 2019-04-30 NOTE — ED Provider Notes (Signed)
[facility] EMERGENCY DEPARTMENT Provider Note   CSN: [id number] Arrival date & time: 04/30/19  1732     History Chief Complaint  Patient presents with  . Appendicits    Evelyn Goodwin is a 24 y.o. female.  HPI      Evelyn Goodwin is a 24 y.o. female, with a history of anxiety and depression, presenting to the ED with appendicitis diagnosed by CT performed earlier today. CT was ordered by patient's PCP.  Patient states she had diarrhea two weeks ago, called PCP, patient was seen by PCP last week, and then had CT abdomen/pelvis today due to onset of abdominal pain.  This imaging study showed acute appendicitis with possible developing abscess and she was told to come to the ED.  She notes some pain to the epigastric region and lower abdomen, vague description, currently mild, nonradiating. Last food intake around 12 PM today.  Denies fever/chills, nausea/vomiting, continued diarrhea, urinary symptoms, chest pain, shortness of breath, or any other complaints.  Past Medical History:  Diagnosis Date  . Anxiety   . Depression   . Psoriasis   . Scoliosis     Patient Active Problem List   Diagnosis Date Noted  . Ruptured appendicitis 04/30/2019  . Anxiety and depression 10/11/2018  . Other idiopathic scoliosis, lumbar region 10/11/2018  . History of ADHD 10/11/2018  . 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 Psoriatic arthritis ([place]) 10/11/2018    Past Surgical History:  Procedure Laterality Date  . WISDOM TOOTH EXTRACTION  2014     OB History   No obstetric history on file.     Family History  Problem Relation Age of Onset  . Psoriasis Mother   . Healthy Brother     Social History   Tobacco Use  . Smoking status: Never Smoker  . Smokeless tobacco: Never Used  Substance Use Topics  . Alcohol use: Yes    Comment: Rare  . Drug use: Not Currently    Home Medications Prior to Admission medications   Medication Sig Start Date End Date  Taking? Authorizing Provider  amphetamine-dextroamphetamine (ADDERALL XR) 20 MG 24 hr capsule Take 20 mg by mouth as needed (work days).  10/13/18  Yes [provider]  clotrimazole-betamethasone (LOTRISONE) cream Apply 1 application topically 2 (two) times daily.  10/09/18  Yes [provider]  cyclobenzaprine (FLEXERIL) 5 MG tablet Take 5 mg by mouth at bedtime as needed. 04/20/19  Yes [provider]  dicyclomine (BENTYL) 20 MG tablet Take 20 mg by mouth 4 (four) times daily as needed for spasms.  04/20/19  Yes [provider]  DULoxetine (CYMBALTA) 20 MG capsule Take 20 mg by mouth 2 (two) times daily. 04/18/19  Yes [provider]  halobetasol (ULTRAVATE) 0.05 % cream Apply topically 2 (two) times daily as needed for up to 14 days. 04/19/19 05/03/19 Yes [name], MD  norethindrone-ethinyl estradiol (JUNEL 1/20) 1-20 MG-MCG tablet Take 1 tablet by mouth at bedtime.  08/17/18  Yes [provider]  ondansetron (ZOFRAN-ODT) 4 MG disintegrating tablet Take 4 mg by mouth every 8 (eight) hours as needed for nausea or vomiting.  04/20/19  Yes [provider]  Secukinumab, 300 MG Dose, (COSENTYX, 300 MG DOSE,) 150 MG/ML SOSY Inject 300 mg into the skin every 30 (thirty) days.   Yes [provider]  HYDROcodone-acetaminophen (NORCO/VICODIN) 5-325 MG tablet Take 2 tablets by mouth every 4 (four) hours as needed. Patient not taking: Reported on 04/30/2019 12/24/18  Henderly, Britni A, PA-C  metoprolol succinate (TOPROL XL) 25 MG 24 hr tablet Take 1 tablet (25 mg total) by mouth at bedtime. Patient not taking: Reported on 04/30/2019 03/12/19   Lelon Perla, MD    Allergies    Penicillin g  Review of Systems   Review of Systems  Constitutional: Negative for chills, diaphoresis and fever.  Respiratory: Negative for cough and shortness of breath.   Cardiovascular: Negative for chest pain.  Gastrointestinal: Positive for abdominal pain.  Negative for blood in stool, nausea and vomiting.  Neurological: Negative for dizziness, syncope and weakness.  All other systems reviewed and are negative.  Physical Exam Updated Vital Signs BP (!) 129/98 (BP Location: Right Arm)   Pulse (!) 125   Temp 98.4 F (36.9 C) (Oral)   Resp 18   Ht 5' 7"  (1.702 m)   Wt 52.2 kg   SpO2 98%   BMI 18.01 kg/m   Physical Exam Vitals and nursing note reviewed.  Constitutional:      General: She is not in acute distress.    Appearance: She is well-developed. She is not diaphoretic.  HENT:     Head: Normocephalic and atraumatic.     Mouth/Throat:     Mouth: Mucous membranes are moist.     Pharynx: Oropharynx is clear.  Eyes:     Conjunctiva/sclera: Conjunctivae normal.  Cardiovascular:     Rate and Rhythm: Regular rhythm. Tachycardia present.     Pulses: Normal pulses.          Radial pulses are 2+ on the right side and 2+ on the left side.       Posterior tibial pulses are 2+ on the right side and 2+ on the left side.     Heart sounds: Normal heart sounds.     Comments: Tactile temperature in the extremities appropriate and equal bilaterally. Pulmonary:     Effort: Pulmonary effort is normal. No respiratory distress.     Breath sounds: Normal breath sounds.  Abdominal:     Palpations: Abdomen is soft.     Tenderness: There is no abdominal tenderness. There is no guarding.     Comments: Patient does not seem to have any tenderness anywhere in the abdomen.  Musculoskeletal:     Cervical back: Neck supple.     Right lower leg: No edema.     Left lower leg: No edema.  Lymphadenopathy:     Cervical: No cervical adenopathy.  Skin:    General: Skin is warm and dry.  Neurological:     Mental Status: She is alert.  Psychiatric:        Mood and Affect: Mood and affect normal.        Speech: Speech normal.        Behavior: Behavior normal.     ED Results / Procedures / Treatments   Labs (all labs ordered are listed, but only  abnormal results are displayed) Labs Reviewed  COMPREHENSIVE METABOLIC PANEL - Abnormal; Notable for the following components:      Result Value   Glucose, Bld 108 (*)    Total Protein 8.5 (*)    All other components within normal limits  CBC - Abnormal; Notable for the following components:   WBC 10.6 (*)    Platelets 523 (*)    All other components within normal limits  URINALYSIS, ROUTINE W REFLEX MICROSCOPIC - Abnormal; Notable for the following components:   Specific Gravity, Urine 1.035 (*)  Leukocytes,Ua SMALL (*)    Bacteria, UA RARE (*)    All other components within normal limits  SARS CORONAVIRUS 2 (TAT 6-24 HRS)  LIPASE, BLOOD  I-STAT BETA HCG BLOOD, ED ([facility], [facility], [facility] ONLY)    EKG None  Radiology DG Chest 2 View  Result Date: 04/30/2019 CLINICAL DATA:  Chest tightness EXAM: CHEST - 2 VIEW COMPARISON:  November 28, 2018. FINDINGS: Lungs are clear. Heart size and pulmonary vascularity are normal. No adenopathy. No pneumothorax. No bone lesions. IMPRESSION: No abnormality noted. Electronically Signed   By: Lowella Grip III M.D.   On: 04/30/2019 16:57   CT ABDOMEN PELVIS W CONTRAST  Result Date: 04/30/2019 CLINICAL DATA:  Periumbilical abdominal pain and nausea for 2 weeks EXAM: CT ABDOMEN AND PELVIS WITH CONTRAST TECHNIQUE: Multidetector CT imaging of the abdomen and pelvis was performed using the standard protocol following bolus administration of intravenous contrast. Sagittal and coronal MPR images reconstructed from axial data set. CONTRAST:  84m OMNIPAQUE IOHEXOL 300 MG/ML SOLN IV. Dilute oral contrast. COMPARISON:  11/28/2018 FINDINGS: Lower chest: Lung bases clear Hepatobiliary: Focal fatty infiltration of liver adjacent to falciform fissure. Gallbladder and liver otherwise normal appearance Pancreas: Normal appearance Spleen: Normal appearance Adrenals/Urinary Tract: Adrenal glands, kidneys, ureters, and bladder normal appearance Stomach/Bowel: Stomach and small  bowel loops normal appearance. Wall thickening of cecum see below. Remainder of colon unremarkable. Changes of acute appendicitis: Appendix: Location: Medial and inferior to cecum Diameter: 18 mm Appendicolith: None Mucosal hyper-enhancement: Present Extraluminal gas: None Periappendiceal collection: Present medial to appendix 14 x 10 x 17 mm Vascular/Lymphatic: Few normal sized mesenteric lymph nodes RIGHT mid abdomen. No adenopathy. Vascular structures patent. Reproductive: Unremarkable uterus and adnexa Other: Small amount of free fluid in pelvis. No free air. No hernia. Musculoskeletal: Unremarkable IMPRESSION: Acute appendicitis with significant periappendiceal inflammation and a small periappendiceal collection 14 x 17 x 10 mm question developing abscess. Small amount of nonspecific free pelvic fluid. These results will be called to the ordering clinician or representative by the Radiologist Assistant, and communication documented in the PACS or [employer] Electronically Signed   By: MLavonia DanaM.D.   On: 04/30/2019 16:04    Procedures .Critical Care Performed by: JLorayne Bender PA-C Authorized by: JLorayne Bender PA-C   Critical care provider statement:    Critical care time (minutes):  35   Critical care time was exclusive of:  Separately billable procedures and treating other patients   Critical care was necessary to treat or prevent imminent or life-threatening deterioration of the following conditions: Appendicitis with possible abscess.   Critical care was time spent personally by me on the following activities:  Evaluation of patient's response to treatment, discussions with consultants, development of treatment plan with patient or surrogate, obtaining history from patient or surrogate, examination of patient, review of old charts, re-evaluation of patient's condition, ordering and review of radiographic studies, ordering and review of laboratory studies and ordering and performing  treatments and interventions (I did not order the imaging studies or labs, but did review and interpret them)   I assumed direction of critical care for this patient from another provider in my specialty: no     (including critical care time)  Medications Ordered in ED Medications  sodium chloride flush (NS) 0.9 % injection 3 mL (3 mLs Intravenous Given 04/30/19 2115)  cefTRIAXone (ROCEPHIN) 2 g in sodium chloride 0.9 % 100 mL IVPB (0 g Intravenous Stopped 04/30/19 2158)    And  metroNIDAZOLE (FLAGYL) IVPB 500 mg (0 mg Intravenous Stopped 04/30/19 2229)  sodium chloride 0.9 % bolus 1,000 mL (0 mLs Intravenous Stopped 04/30/19 2229)  LORazepam (ATIVAN) injection 1 mg (0.5 mg Intravenous Given 04/30/19 2201)    ED Course  I have reviewed the triage vital signs and the nursing notes.  Pertinent labs & imaging results that were available during my care of the patient were reviewed by me and considered in my medical decision making (see chart for details).  Clinical Course as of Apr 29 2256  Mon Apr 30, 2019  7026 Patient not in the room.   [SJ]  2023 Spoke with Dr. Grandville Silos, general surgery. States he will come see the patient.   [SJ]  2033 Dr. Grandville Silos evaluated the patient and will admit.  Requests 6-24-hour Covid.   [SJ]  2140 Patient saying she wants to go home. Says she feels very anxious about being in the hospital. I had quite a long discussion with the patient in order to reassure her and convince her to stay to continue receiving treatment. We agreed upon a plan that includes antianxiety medications to try to manage her anxiety.   [SJ]    Clinical Course User Index [SJ] Ziquan Fidel, Helane Gunther, PA-C   MDM Rules/Calculators/A&P                      Patient presents with acute appendicitis with developing abscess via CT. I personally reviewed and interpreted the patient's labs and imaging studies. Mild leukocytosis. Patient is nontoxic appearing, afebrile, not tachypneic, not hypotensive,  maintains excellent SPO2 on room air, and is in no apparent distress.  Tachycardic upon initial presentation, improved with IV fluids. Patient admitted via general surgery.  Findings and plan of care discussed with Madalyn Rob, MD. Dr. Roslynn Amble personally evaluated and examined this patient.   Vitals:   04/30/19 1745 04/30/19 2008 04/30/19 2200 04/30/19 2230  BP: (!) 130/91 (!) 129/98 133/90 117/86  Pulse: (!) 141 (!) 125 (!) 103 93  Resp: 20 18 18    Temp: 98.5 F (36.9 C) 98.4 F (36.9 C)    TempSrc: Oral Oral    SpO2: 100% 98% 98% 99%  Weight:      Height:         Final Clinical Impression(s) / ED Diagnoses Final diagnoses:  Acute appendicitis with perforation, localized peritonitis, and abscess, without gangrene    Rx / DC Orders ED Discharge Orders    None       Layla Maw 04/30/19 2258    Lucrezia Starch, MD 05/02/19 1254

## 2019-04-30 NOTE — ED Triage Notes (Signed)
Pt in POV. Had CT scan done this AM for abd pain. Confirmed appendicitis. Pt reports she doesn't want surgery, also refusing blood work in triage.

## 2019-04-30 NOTE — H&P (Addendum)
Evelyn Goodwin is an 24 y.o. female.   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  She complains of epigastric discomfort and has had some significant anorexia.  She has not been able to eat much.  She continued to have abdominal pain and underwent an outpatient CT scan today which showed ruptured appendicitis with a small abscess.  I was asked to see her for surgical management.  She complains of some anxiety regarding the situation as well.  Past Medical History:  Diagnosis Date  . Anxiety   . Depression   . Psoriasis   . Scoliosis     Past Surgical History:  Procedure Laterality Date  . WISDOM TOOTH EXTRACTION  2014    Family History  Problem Relation Age of Onset  . Psoriasis Mother   . Healthy Brother    Social History:  reports that she has never smoked. She has never used smokeless tobacco. She reports current alcohol use. She reports previous drug use.  Allergies:  Allergies  Allergen Reactions  . Penicillin G Rash    (Not in a hospital admission)   Results for orders placed or performed during the hospital encounter of 04/30/19 (from the past 48 hour(s))  Lipase, blood     Status: None   Collection Time: 04/30/19  5:56 PM  Result Value Ref Range   Lipase 39 11 - 51 U/L    Comment: Performed at [facility], [address] [place], [place] [address]  Comprehensive metabolic panel     Status: Abnormal   Collection Time: 04/30/19  5:56 PM  Result Value Ref Range   Sodium 141 135 - 145 mmol/L   Potassium 3.7 3.5 - 5.1 mmol/L   Chloride 106 98 - 111 mmol/L   CO2 24 22 - 32 mmol/L   Glucose, Bld 108 (H) 70 - 99 mg/dL    Comment: Glucose reference range applies only to samples taken after fasting for at least 8 hours.   BUN 8 6 - 20 mg/dL   Creatinine, Ser 0.72 0.44 - 1.00 mg/dL   Calcium 9.2 8.9 - 10.3 mg/dL   Total Protein 8.5 (H) 6.5 - 8.1 g/dL   Albumin 3.8 3.5 - 5.0  g/dL   AST 17 15 - 41 U/L   ALT 20 0 - 44 U/L   Alkaline Phosphatase 69 38 - 126 U/L   Total Bilirubin 0.4 0.3 - 1.2 mg/dL   GFR calc non Af Amer >60 >60 mL/min   GFR calc Af Amer >60 >60 mL/min   Anion gap 11 5 - 15    Comment: Performed at [facility], [place] [address] [place], [place] [address]  CBC     Status: Abnormal   Collection Time: 04/30/19  5:56 PM  Result Value Ref Range   WBC 10.6 (H) 4.0 - 10.5 K/uL   RBC 4.97 3.87 - 5.11 MIL/uL   Hemoglobin 13.4 12.0 - 15.0 g/dL   HCT 42.5 36.0 - 46.0 %   MCV 85.5 80.0 - 100.0 fL   MCH 27.0 26.0 - 34.0 pg   MCHC 31.5 30.0 - 36.0 g/dL   RDW 14.5 11.5 - 15.5 %   Platelets 523 (H) 150 - 400 K/uL   nRBC 0.0 0.0 - 0.2 %    Comment: Performed at [facility], [place] [address], [place], [place] [address]  I-Stat beta  hCG blood, ED     Status: None   Collection Time: 04/30/19  6:03 PM  Result Value Ref Range   I-stat hCG, quantitative <5.0 <5 mIU/mL   Comment 3            Comment:   GEST. AGE      CONC.  (mIU/mL)   <=1 WEEK        5 - 50     2 WEEKS       50 - 500     3 WEEKS       100 - 10,000     4 WEEKS     1,000 - 30,000        FEMALE AND NON-PREGNANT FEMALE:     LESS THAN 5 mIU/mL   Urinalysis, Routine w reflex microscopic     Status: Abnormal   Collection Time: 04/30/19  7:35 PM  Result Value Ref Range   Color, Urine YELLOW YELLOW   APPearance CLEAR CLEAR   Specific Gravity, Urine 1.035 (H) 1.005 - 1.030   pH 5.0 5.0 - 8.0   Glucose, UA NEGATIVE NEGATIVE mg/dL   Hgb urine dipstick NEGATIVE NEGATIVE   Bilirubin Urine NEGATIVE NEGATIVE   Ketones, ur NEGATIVE NEGATIVE mg/dL   Protein, ur NEGATIVE NEGATIVE mg/dL   Nitrite NEGATIVE NEGATIVE   Leukocytes,Ua SMALL (A) NEGATIVE   RBC / HPF 0-5 0 - 5 RBC/hpf   WBC, UA 6-10 0 - 5 WBC/hpf   Bacteria, UA RARE (A) NONE SEEN   Squamous Epithelial / LPF 0-5 0 - 5   Mucus PRESENT     Comment: Performed at [facility], [address], [place], [place] [address]    DG Chest 2 View  Result Date: 04/30/2019 CLINICAL DATA:  Chest tightness EXAM: CHEST - 2 VIEW COMPARISON:  November 28, 2018. FINDINGS: Lungs are clear. Heart size and pulmonary vascularity are normal. No adenopathy. No pneumothorax. No bone lesions. IMPRESSION: No abnormality noted. Electronically Signed   By: Lowella Grip III M.D.   On: 04/30/2019 16:57   CT ABDOMEN PELVIS W CONTRAST  Result Date: 04/30/2019 CLINICAL DATA:  Periumbilical abdominal pain and nausea for 2 weeks EXAM: CT ABDOMEN AND PELVIS WITH CONTRAST TECHNIQUE: Multidetector CT imaging of the abdomen and pelvis was performed using the standard protocol following bolus administration of intravenous contrast. Sagittal and coronal MPR images reconstructed from axial data set. CONTRAST:  47m OMNIPAQUE IOHEXOL 300 MG/ML SOLN IV. Dilute oral contrast. COMPARISON:  11/28/2018 FINDINGS: Lower chest: Lung bases clear Hepatobiliary: Focal fatty infiltration of liver adjacent to falciform fissure. Gallbladder and liver otherwise normal appearance Pancreas: Normal appearance Spleen: Normal appearance Adrenals/Urinary Tract: Adrenal glands, kidneys, ureters, and bladder normal appearance Stomach/Bowel: Stomach and small bowel loops normal appearance. Wall thickening of cecum see below. Remainder of colon unremarkable. Changes of acute appendicitis: Appendix: Location: Medial and inferior to cecum Diameter: 18 mm Appendicolith: None Mucosal hyper-enhancement: Present Extraluminal gas: None Periappendiceal collection: Present medial to appendix 14 x 10 x 17 mm Vascular/Lymphatic: Few normal sized mesenteric lymph nodes RIGHT mid abdomen. No adenopathy. Vascular structures patent. Reproductive: Unremarkable uterus and adnexa Other: Small amount of free fluid in pelvis. No free air. No hernia. Musculoskeletal: Unremarkable IMPRESSION: Acute appendicitis with significant periappendiceal inflammation and a small periappendiceal collection 14 x 17 x 10  mm question developing abscess. Small amount of nonspecific free pelvic fluid. These results will be called to the ordering clinician or representative by the Radiologist Assistant, and  communication documented in the PACS or [employer]. Electronically Signed   By: Lavonia Dana M.D.   On: 04/30/2019 16:04    Review of Systems  Constitutional: Positive for appetite change. Negative for chills.  HENT: Negative.   Eyes: Negative.   Respiratory: Negative.   Cardiovascular: Negative.   Gastrointestinal: Positive for abdominal pain and nausea. Negative for diarrhea.  Endocrine: Negative.   Genitourinary: Negative.   Musculoskeletal: Negative.   Skin: Negative.   Allergic/Immunologic: Negative.   Neurological: Negative.   Hematological: Negative.   Psychiatric/Behavioral: The patient is nervous/anxious.     Blood pressure (!) 129/98, pulse (!) 125, temperature 98.4 F (36.9 C), temperature source Oral, resp. rate 18, height 5' 7"  (1.702 m), weight 52.2 kg, SpO2 98 %. Physical Exam  Constitutional: She is oriented to person, place, and time. She appears well-developed and well-nourished. No distress.  HENT:  Head: Normocephalic.  Right Ear: External ear normal.  Left Ear: External ear normal.  Mouth/Throat: Oropharynx is clear and moist.  Eyes: Pupils are equal, round, and reactive to light. EOM are normal. Right eye exhibits no discharge. Left eye exhibits no discharge. No scleral icterus.  Neck: No thyromegaly present.  Cardiovascular: Normal heart sounds and intact distal pulses.  Heart rate 120  Respiratory: Effort normal and breath sounds normal. No respiratory distress. She has no wheezes. She has no rales.  GI: Soft. She exhibits no distension. There is abdominal tenderness. There is no rebound and no guarding.  Some tenderness in the right lower quadrant without guarding, no peritonitis, no hepatosplenomegaly  Musculoskeletal:        General: No deformity or edema. Normal  range of motion.     Cervical back: Neck supple.  Lymphadenopathy:    She has no cervical adenopathy.  Neurological: She is alert and oriented to person, place, and time. She exhibits normal muscle tone.  Skin: Skin is warm and dry.  Psychiatric:  Somewhat anxious, alert and oriented x3     Assessment/Plan Ruptured appendicitis with small abscess -admit to inpatient, bowel rest, IV Rocephin and Flagyl have been started in the ED.  I believe the abscess is too small to drain.  Will treat with antibiotics at this time.  I feel surgery urgently would require an ileocecectomy.  Hopefully, she will improve on antibiotics.  I discussed the plan in detail with her and answered her questions as well as reviewing her CT scan with her.  Home meds for hypertension and anxiety  Zenovia Jarred, MD 04/30/2019, 8:54 PM

## 2019-04-30 NOTE — ED Notes (Signed)
Report attempted, 6N unable to take at this time

## 2019-05-01 LAB — CBC
HCT: 35.4 % — ABNORMAL LOW (ref 36.0–46.0)
Hemoglobin: 11.2 g/dL — ABNORMAL LOW (ref 12.0–15.0)
MCH: 27.4 pg (ref 26.0–34.0)
MCHC: 31.6 g/dL (ref 30.0–36.0)
MCV: 86.6 fL (ref 80.0–100.0)
Platelets: 398 10*3/uL (ref 150–400)
RBC: 4.09 MIL/uL (ref 3.87–5.11)
RDW: 14.5 % (ref 11.5–15.5)
WBC: 9.4 10*3/uL (ref 4.0–10.5)
nRBC: 0 % (ref 0.0–0.2)

## 2019-05-01 LAB — BASIC METABOLIC PANEL
Anion gap: 10 (ref 5–15)
BUN: 5 mg/dL — ABNORMAL LOW (ref 6–20)
CO2: 21 mmol/L — ABNORMAL LOW (ref 22–32)
Calcium: 8.4 mg/dL — ABNORMAL LOW (ref 8.9–10.3)
Chloride: 108 mmol/L (ref 98–111)
Creatinine, Ser: 0.66 mg/dL (ref 0.44–1.00)
GFR calc Af Amer: >60 mL/min (ref >60)
GFR calc non Af Amer: >60 mL/min (ref >60)
Glucose, Bld: 84 mg/dL (ref 70–99)
Potassium: 3.9 mmol/L (ref 3.5–5.1)
Sodium: 139 mmol/L (ref 135–145)

## 2019-05-01 LAB — HIV ANTIBODY (ROUTINE TESTING W REFLEX): HIV Screen 4th Generation wRfx: NONREACTIVE

## 2019-05-01 LAB — SARS CORONAVIRUS 2 (TAT 6-24 HRS): SARS Coronavirus 2: NEGATIVE

## 2019-05-01 MED ORDER — KETOROLAC TROMETHAMINE 30 MG/ML IJ SOLN
30.0000 mg | Freq: Three times a day (TID) | INTRAMUSCULAR | Status: DC
  Administered 2019-05-01 – 2019-05-03 (×6): 30 mg via INTRAVENOUS
  Filled 2019-05-01 (×6): qty 1

## 2019-05-01 MED ORDER — LORAZEPAM 2 MG/ML IJ SOLN: 0.5000 mg | Freq: Four times a day (QID) | INTRAMUSCULAR | Status: DC | PRN

## 2019-05-01 MED ORDER — ENOXAPARIN SODIUM 40 MG/0.4ML ~~LOC~~ SOLN
40.0000 mg | SUBCUTANEOUS | Status: DC
  Filled 2019-05-01 (×3): qty 0.4

## 2019-05-01 MED ORDER — ADULT MULTIVITAMIN W/MINERALS CH
1.0000 | ORAL_TABLET | Freq: Every day | ORAL | Status: DC
  Administered 2019-05-02 – 2019-05-03 (×2): 1 via ORAL
  Filled 2019-05-01 (×2): qty 1

## 2019-05-01 MED ORDER — PROMETHAZINE HCL 25 MG/ML IJ SOLN
25.0000 mg | Freq: Four times a day (QID) | INTRAMUSCULAR | Status: DC | PRN
  Administered 2019-05-01 – 2019-05-02 (×3): 25 mg via INTRAVENOUS
  Filled 2019-05-01 (×5): qty 1

## 2019-05-01 MED ORDER — LORAZEPAM BOLUS VIA INFUSION: 0.5000 mg | Freq: Four times a day (QID) | INTRAVENOUS | Status: DC | PRN

## 2019-05-01 MED ORDER — BOOST / RESOURCE BREEZE PO LIQD CUSTOM
1.0000 | Freq: Three times a day (TID) | ORAL | Status: DC
  Administered 2019-05-02 – 2019-05-03 (×3): 1 via ORAL

## 2019-05-01 NOTE — Plan of Care (Signed)

## 2019-05-01 NOTE — Progress Notes (Signed)
Initial Nutrition Assessment  DOCUMENTATION CODES:   Underweight  INTERVENTION:   -Boost Breeze po TID, each supplement provides 250 kcal and 9 grams of protein -MVI with minerals daily -RD will follow for diet advancement and adjust supplement regimen as appropriate  NUTRITION DIAGNOSIS:   Inadequate oral intake related to altered GI function as evidenced by per patient/family report.  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  She complains of epigastric discomfort and has had some significant anorexia.  She has not been able to eat much.  She continued to have abdominal pain and underwent an outpatient CT scan today which showed ruptured appendicitis with a small abscess.  I was asked to see her for surgical management.  She complains of some anxiety regarding the situation as well.  Pt admitted with ruptured appendicitis with small abscess.   Attempted to speak with pt, however, sleeping soundly at time of visit. RD also attempted to speak with pt via phone, however, no answer.   Per MD notes, plan for bowel rest and conservative management to hopefully prevent need for surgical intervention. Pt complained of hunger on MD visit earlier today and was advanced to clear liquid diet. No meal completion data documented currently.   Reviewed wt hx; wt has been stable over the past 6 months.   Medications reviewed and include 0.9% NaCl with KCl 20 mEq/L infusion @ 125 ml/hr.   Diet Order:   Diet Order            Diet clear liquid Room service appropriate? Yes; Fluid consistency: Thin  Diet effective now              EDUCATION NEEDS:   No education needs have been identified at this time  Skin:   Skin Assessment: Reviewed RN Assessment  Last BM:  05/01/19  Height:   Ht Readings from Last 1 Encounters:  04/30/19 5' 7"  (1.702 m)    Weight:   Wt Readings from Last 1 Encounters:  04/30/19 52.2 kg    Ideal Body Weight:  61.4 kg  BMI:  Body mass index is 18.02 kg/m.  Estimated Nutritional Needs:   Kcal:  1550-1650  Protein:  80-95 grams  Fluid:  > 1.5 L    Loistine Chance, RD, LDN, [place] Registered Dietitian II Certified Diabetes Care and Education Specialist Please refer to [facility] for RD and/or RD on-call/weekend/after hours pager

## 2019-05-01 NOTE — Progress Notes (Signed)
Patient stating that she vomited bile. Small amount of clear spit up in the trash can observed by RN.

## 2019-05-01 NOTE — Progress Notes (Signed)
Patient ID: Evelyn Goodwin, female   DOB: 07-12-1995, 24 y.o.   MRN: [id number]       Subjective: Patient complains of RLQ pain still.  States she is hungry.  Unable to tell if she is really much better than prior to admission or not.  ROS: See above, otherwise other systems negative  Objective: Vital signs in last 24 hours: Temp:  [98.3 F (36.8 C)-99.6 F (37.6 C)] 98.6 F (37 C) (04/13 0621) Pulse Rate:  [60-141] 97 (04/13 0621) Resp:  [16-20] 17 (04/13 0621) BP: (105-133)/(73-98) 105/78 (04/13 0621) SpO2:  [98 %-100 %] 100 % (04/13 0621) Weight:  [52.2 kg] 52.2 kg (04/12 2257) Last BM Date: 05/01/19  Intake/Output from previous day: 04/12 0701 - 04/13 0700 In: 2074.7 [I.V.:446.1; IV Piggyback:1628.6] Out: -  Intake/Output this shift: No intake/output data recorded.  PE: Gen: anxious, but NAD Heart: regular Lungs: CTAB Abd: soft, tender in RLQ as expected, but no guarding or peritoneal signs, +BS, ND Psych: A&Ox3  Lab Results:  Recent Labs    04/30/19 1756 05/01/19 0634  WBC 10.6* 9.4  HGB 13.4 11.2*  HCT 42.5 35.4*  PLT 523* 398   BMET Recent Labs    04/30/19 1756 05/01/19 0634  NA 141 139  K 3.7 3.9  CL 106 108  CO2 24 21*  GLUCOSE 108* 84  BUN 8 5*  CREATININE 0.72 0.66  CALCIUM 9.2 8.4*   PT/INR No results for input(s): LABPROT, INR in the last 72 hours. CMP     Component Value Date/Time   NA 139 05/01/2019 0634   K 3.9 05/01/2019 0634   CL 108 05/01/2019 0634   CO2 21 (L) 05/01/2019 0634   GLUCOSE 84 05/01/2019 0634   BUN 5 (L) 05/01/2019 0634   CREATININE 0.66 05/01/2019 0634   CALCIUM 8.4 (L) 05/01/2019 0634   PROT 8.5 (H) 04/30/2019 1756   ALBUMIN 3.8 04/30/2019 1756   AST 17 04/30/2019 1756   ALT 20 04/30/2019 1756   ALKPHOS 69 04/30/2019 1756   BILITOT 0.4 04/30/2019 1756   GFRNONAA >60 05/01/2019 0634   GFRAA >60 05/01/2019 0634   Lipase     Component Value Date/Time   LIPASE 39 04/30/2019 1756        Studies/Results: DG Chest 2 View  Result Date: 04/30/2019 CLINICAL DATA:  Chest tightness EXAM: CHEST - 2 VIEW COMPARISON:  November 28, 2018. FINDINGS: Lungs are clear. Heart size and pulmonary vascularity are normal. No adenopathy. No pneumothorax. No bone lesions. IMPRESSION: No abnormality noted. Electronically Signed   By: Lowella Grip III M.D.   On: 04/30/2019 16:57   CT ABDOMEN PELVIS W CONTRAST  Result Date: 04/30/2019 CLINICAL DATA:  Periumbilical abdominal pain and nausea for 2 weeks EXAM: CT ABDOMEN AND PELVIS WITH CONTRAST TECHNIQUE: Multidetector CT imaging of the abdomen and pelvis was performed using the standard protocol following bolus administration of intravenous contrast. Sagittal and coronal MPR images reconstructed from axial data set. CONTRAST:  4m OMNIPAQUE IOHEXOL 300 MG/ML SOLN IV. Dilute oral contrast. COMPARISON:  11/28/2018 FINDINGS: Lower chest: Lung bases clear Hepatobiliary: Focal fatty infiltration of liver adjacent to falciform fissure. Gallbladder and liver otherwise normal appearance Pancreas: Normal appearance Spleen: Normal appearance Adrenals/Urinary Tract: Adrenal glands, kidneys, ureters, and bladder normal appearance Stomach/Bowel: Stomach and small bowel loops normal appearance. Wall thickening of cecum see below. Remainder of colon unremarkable. Changes of acute appendicitis: Appendix: Location: Medial and inferior to cecum Diameter: 18 mm Appendicolith: None Mucosal hyper-enhancement:  Present Extraluminal gas: None Periappendiceal collection: Present medial to appendix 14 x 10 x 17 mm Vascular/Lymphatic: Few normal sized mesenteric lymph nodes RIGHT mid abdomen. No adenopathy. Vascular structures patent. Reproductive: Unremarkable uterus and adnexa Other: Small amount of free fluid in pelvis. No free air. No hernia. Musculoskeletal: Unremarkable IMPRESSION: Acute appendicitis with significant periappendiceal inflammation and a small  periappendiceal collection 14 x 17 x 10 mm question developing abscess. Small amount of nonspecific free pelvic fluid. These results will be called to the ordering clinician or representative by the Radiologist Assistant, and communication documented in the PACS or [employer]. Electronically Signed   By: Lavonia Dana M.D.   On: 04/30/2019 16:04    Anti-infectives: Anti-infectives (From admission, onward)   Start     Dose/Rate Route Frequency Ordered Stop   05/01/19 2200  cefTRIAXone (ROCEPHIN) 2 g in sodium chloride 0.9 % 100 mL IVPB     2 g 200 mL/hr over 30 Minutes Intravenous Every 24 hours 04/30/19 2302     05/01/19 0600  metroNIDAZOLE (FLAGYL) IVPB 500 mg     500 mg 100 mL/hr over 60 Minutes Intravenous Every 8 hours 04/30/19 2302     04/30/19 2015  cefTRIAXone (ROCEPHIN) 2 g in sodium chloride 0.9 % 100 mL IVPB     2 g 200 mL/hr over 30 Minutes Intravenous  Once 04/30/19 2014 04/30/19 2158   04/30/19 2015  metroNIDAZOLE (FLAGYL) IVPB 500 mg     500 mg 100 mL/hr over 60 Minutes Intravenous  Once 04/30/19 2014 04/30/19 2229       Assessment/Plan Acute perforated appendicitis with small abscess  -fluid collection is too small to drain -will treat with IV abx therapy and conservative management at this point. - I spent a long time discussing the patient's diagnosis and plan for treatment with her.  She seemed to not understand initially so we went over it again.  We discussed that this can likely resolve without surgery, but if she were to worsen or fail to improve then she may require surgery.    We discussed the possibility of an interval appy in 6-8 weeks, but the possibility that if she is doing well and no fecalith that she may not need surgery at all. -mobilize -clear liquids today -WBC normal  FEN - CLD/IVFs VTE - lovenox ID - Rocephin/Flagyl   LOS: 1 day    Henreitta Cea , [facility] Surgery 05/01/2019, 9:03 AM Please see Amion for pager number  during day hours 7:00am-4:30pm or 7:00am -11:30am on weekends

## 2019-05-01 NOTE — Progress Notes (Signed)
Patient vomited small amount of light green liquid. PRN Zofran administered. Dr. Rosendo Gros notified.

## 2019-05-02 MED ORDER — ACETAMINOPHEN 500 MG PO TABS
1000.0000 mg | ORAL_TABLET | Freq: Four times a day (QID) | ORAL | Status: DC
  Administered 2019-05-02 – 2019-05-03 (×3): 1000 mg via ORAL
  Filled 2019-05-02 (×5): qty 2

## 2019-05-02 MED ORDER — PROMETHAZINE HCL 25 MG/ML IJ SOLN
12.5000 mg | Freq: Four times a day (QID) | INTRAMUSCULAR | Status: DC | PRN
  Administered 2019-05-02: 14:00:00 25 mg via INTRAVENOUS

## 2019-05-02 MED ORDER — MORPHINE SULFATE (PF) 2 MG/ML IV SOLN
1.0000 mg | INTRAVENOUS | Status: DC | PRN
  Administered 2019-05-03: 2 mg via INTRAVENOUS
  Filled 2019-05-02: qty 1

## 2019-05-02 NOTE — Progress Notes (Addendum)
Patient ID: Evelyn Goodwin, female   DOB: [birth year], 24 y.o.   MRN: [id number]       Subjective: Patient tells me she has pain in her RLQ today.  She states she threw up 3 times yesterday.  Per nursing note, some of that appeared a very small amount of clear liquid.  Patient hasn't really gotten out of bed to mobilize since admission.  Taking a fair amount of oral and iv pain medication along with phenergan  ROS: See above, otherwise other systems negative  Objective: Vital signs in last 24 hours: Temp:  [98.1 F (36.7 C)-98.6 F (37 C)] 98.1 F (36.7 C) (04/14 0532) Pulse Rate:  [61-98] 72 (04/14 0532) Resp:  [16-18] 18 (04/14 0532) BP: (110-134)/(67-92) 122/82 (04/14 0532) SpO2:  [98 %-100 %] 98 % (04/14 0532) Last BM Date: 05/01/19  Intake/Output from previous day: 04/13 0701 - 04/14 0700 In: 2608.7 [P.O.:260; I.V.:1948.7; IV Piggyback:400] Out: -  Intake/Output this shift: No intake/output data recorded.  PE: Gen: NAD HEENT: PERRL Neck: trachea midline Heart: regular Lungs: CTAB Abd: soft, mildly tender in RLQ with some in LLQ as well, no peritonitis, +BS, ND MS: MAE Neuro: normal sensation throughout Psych; A&Ox3  Lab Results:  Recent Labs    04/30/19 1756 05/01/19 0634  WBC 10.6* 9.4  HGB 13.4 11.2*  HCT 42.5 35.4*  PLT 523* 398   BMET Recent Labs    04/30/19 1756 05/01/19 0634  NA 141 139  K 3.7 3.9  CL 106 108  CO2 24 21*  GLUCOSE 108* 84  BUN 8 5*  CREATININE 0.72 0.66  CALCIUM 9.2 8.4*   PT/INR No results for input(s): LABPROT, INR in the last 72 hours. CMP     Component Value Date/Time   NA 139 05/01/2019 0634   K 3.9 05/01/2019 0634   CL 108 05/01/2019 0634   CO2 21 (L) 05/01/2019 0634   GLUCOSE 84 05/01/2019 0634   BUN 5 (L) 05/01/2019 0634   CREATININE 0.66 05/01/2019 0634   CALCIUM 8.4 (L) 05/01/2019 0634   PROT 8.5 (H) 04/30/2019 1756   ALBUMIN 3.8 04/30/2019 1756   AST 17 04/30/2019 1756   ALT 20 04/30/2019 1756    ALKPHOS 69 04/30/2019 1756   BILITOT 0.4 04/30/2019 1756   GFRNONAA >60 05/01/2019 0634   GFRAA >60 05/01/2019 0634   Lipase     Component Value Date/Time   LIPASE 39 04/30/2019 1756       Studies/Results: DG Chest 2 View  Result Date: 04/30/2019 CLINICAL DATA:  Chest tightness EXAM: CHEST - 2 VIEW COMPARISON:  November 28, 2018. FINDINGS: Lungs are clear. Heart size and pulmonary vascularity are normal. No adenopathy. No pneumothorax. No bone lesions. IMPRESSION: No abnormality noted. Electronically Signed   By: Lowella Grip III M.D.   On: 04/30/2019 16:57   CT ABDOMEN PELVIS W CONTRAST  Result Date: 04/30/2019 CLINICAL DATA:  Periumbilical abdominal pain and nausea for 2 weeks EXAM: CT ABDOMEN AND PELVIS WITH CONTRAST TECHNIQUE: Multidetector CT imaging of the abdomen and pelvis was performed using the standard protocol following bolus administration of intravenous contrast. Sagittal and coronal MPR images reconstructed from axial data set. CONTRAST:  78m OMNIPAQUE IOHEXOL 300 MG/ML SOLN IV. Dilute oral contrast. COMPARISON:  11/28/2018 FINDINGS: Lower chest: Lung bases clear Hepatobiliary: Focal fatty infiltration of liver adjacent to falciform fissure. Gallbladder and liver otherwise normal appearance Pancreas: Normal appearance Spleen: Normal appearance Adrenals/Urinary Tract: Adrenal glands, kidneys, ureters, and bladder normal appearance  Stomach/Bowel: Stomach and small bowel loops normal appearance. Wall thickening of cecum see below. Remainder of colon unremarkable. Changes of acute appendicitis: Appendix: Location: Medial and inferior to cecum Diameter: 18 mm Appendicolith: None Mucosal hyper-enhancement: Present Extraluminal gas: None Periappendiceal collection: Present medial to appendix 14 x 10 x 17 mm Vascular/Lymphatic: Few normal sized mesenteric lymph nodes RIGHT mid abdomen. No adenopathy. Vascular structures patent. Reproductive: Unremarkable uterus and adnexa Other:  Small amount of free fluid in pelvis. No free air. No hernia. Musculoskeletal: Unremarkable IMPRESSION: Acute appendicitis with significant periappendiceal inflammation and a small periappendiceal collection 14 x 17 x 10 mm question developing abscess. Small amount of nonspecific free pelvic fluid. These results will be called to the ordering clinician or representative by the Radiologist Assistant, and communication documented in the PACS or [employer]. Electronically Signed   By: Lavonia Dana M.D.   On: 04/30/2019 16:04    Anti-infectives: Anti-infectives (From admission, onward)   Start     Dose/Rate Route Frequency Ordered Stop   05/01/19 2200  cefTRIAXone (ROCEPHIN) 2 g in sodium chloride 0.9 % 100 mL IVPB     2 g 200 mL/hr over 30 Minutes Intravenous Every 24 hours 04/30/19 2302     05/01/19 0600  metroNIDAZOLE (FLAGYL) IVPB 500 mg     500 mg 100 mL/hr over 60 Minutes Intravenous Every 8 hours 04/30/19 2302     04/30/19 2015  cefTRIAXone (ROCEPHIN) 2 g in sodium chloride 0.9 % 100 mL IVPB     2 g 200 mL/hr over 30 Minutes Intravenous  Once 04/30/19 2014 04/30/19 2158   04/30/19 2015  metroNIDAZOLE (FLAGYL) IVPB 500 mg     500 mg 100 mL/hr over 60 Minutes Intravenous  Once 04/30/19 2014 04/30/19 2229       Assessment/Plan Anxiety - prn ativan ordered Depression- home meds ADD - home meds  Acute perforated appendicitis with small abscess  -fluid collection is too small to drain -cont to treat with conservative management as she has mild pain to palpation, normal vitals, normal labs, and no evidence of worsening infection or sepsis -we once again went over why we are not operating right now.  She expressed a desire to "just have surgery."  We rediscussed why that is not standard of care in her situation right with a perforation and that surgery puts her at higher risk for colectomy, and a much bigger operation that carries higher risk of complications.   -she has complained of  nausea as well.  I told her then we likely needed to make her NPO, but she stated she had ordered breakfast already and wanted it.  I told her that is she were to continue having nausea/vomiting, then she would need to be NPO -I offered numerous times to call her parents and update them.  She declined. -decrease IV pain medications.  She has been placed on scheduled tylenol and toradol.  Her morphine has been decreased to 1-2 mg q 4hrs for breakthrough only.  She has oxy 5-10 mg q 4hrs prn ordered as well.  Her complaints of pain and use of pain medication at this time seem a bit out of proportion to how she looks clinically.  Will continue to closely monitor.  Repeat labs in am.  FEN - CLD/IVFs VTE - lovenox ID - Rocephin/Flagyl   LOS: 2 days    Henreitta Cea , [facility] 05/02/2019, 9:31 AM Please see Amion for pager number during day hours 7:00am-4:30pm  or 7:00am -11:30am on weekends

## 2019-05-03 LAB — CBC
HCT: 36.2 % (ref 36.0–46.0)
Hemoglobin: 11.4 g/dL — ABNORMAL LOW (ref 12.0–15.0)
MCH: 27 pg (ref 26.0–34.0)
MCHC: 31.5 g/dL (ref 30.0–36.0)
MCV: 85.8 fL (ref 80.0–100.0)
Platelets: 410 10*3/uL — ABNORMAL HIGH (ref 150–400)
RBC: 4.22 MIL/uL (ref 3.87–5.11)
RDW: 14.3 % (ref 11.5–15.5)
WBC: 9.7 10*3/uL (ref 4.0–10.5)
nRBC: 0 % (ref 0.0–0.2)

## 2019-05-03 LAB — BASIC METABOLIC PANEL
Anion gap: 9 (ref 5–15)
BUN: <5 mg/dL — ABNORMAL LOW (ref 6–20)
CO2: 21 mmol/L — ABNORMAL LOW (ref 22–32)
Calcium: 8.3 mg/dL — ABNORMAL LOW (ref 8.9–10.3)
Chloride: 106 mmol/L (ref 98–111)
Creatinine, Ser: 0.56 mg/dL (ref 0.44–1.00)
GFR calc Af Amer: >60 mL/min (ref >60)
GFR calc non Af Amer: >60 mL/min (ref >60)
Glucose, Bld: 84 mg/dL (ref 70–99)
Potassium: 3.8 mmol/L (ref 3.5–5.1)
Sodium: 136 mmol/L (ref 135–145)

## 2019-05-03 MED ORDER — SACCHAROMYCES BOULARDII 250 MG PO CAPS: 250.0000 mg | ORAL_CAPSULE | Freq: Two times a day (BID) | ORAL | Status: DC

## 2019-05-03 MED ORDER — METRONIDAZOLE 500 MG PO TABS
500.0000 mg | ORAL_TABLET | Freq: Three times a day (TID) | ORAL | Status: DC
  Administered 2019-05-03: 500 mg via ORAL
  Filled 2019-05-03: qty 1

## 2019-05-03 MED ORDER — METRONIDAZOLE 500 MG PO TABS: 500.0000 mg | ORAL_TABLET | Freq: Three times a day (TID) | ORAL | 0 refills | Status: DC

## 2019-05-03 MED ORDER — OXYCODONE HCL 5 MG PO TABS: 5.0000 mg | ORAL_TABLET | Freq: Four times a day (QID) | ORAL | 0 refills | Status: DC | PRN

## 2019-05-03 MED ORDER — PROMETHAZINE HCL 25 MG PO TABS: 12.5000 mg | ORAL_TABLET | Freq: Four times a day (QID) | ORAL | Status: DC | PRN

## 2019-05-03 MED ORDER — IBUPROFEN 600 MG PO TABS: 600.0000 mg | ORAL_TABLET | Freq: Four times a day (QID) | ORAL | 0 refills | Status: DC | PRN

## 2019-05-03 MED ORDER — ONDANSETRON 4 MG PO TBDP: 4.0000 mg | ORAL_TABLET | Freq: Four times a day (QID) | ORAL | 0 refills | Status: DC | PRN

## 2019-05-03 MED ORDER — IBUPROFEN 600 MG PO TABS: 600.0000 mg | ORAL_TABLET | Freq: Four times a day (QID) | ORAL | Status: DC | PRN

## 2019-05-03 MED ORDER — CIPROFLOXACIN HCL 500 MG PO TABS: 500.0000 mg | ORAL_TABLET | Freq: Two times a day (BID) | ORAL | Status: DC

## 2019-05-03 MED ORDER — ACETAMINOPHEN 500 MG PO TABS: 1000.0000 mg | ORAL_TABLET | Freq: Four times a day (QID) | ORAL | 0 refills | Status: DC | PRN

## 2019-05-03 MED ORDER — CIPROFLOXACIN HCL 500 MG PO TABS: 500.0000 mg | ORAL_TABLET | Freq: Two times a day (BID) | ORAL | 0 refills | Status: DC

## 2019-05-03 NOTE — Plan of Care (Signed)

## 2019-05-03 NOTE — Progress Notes (Signed)
Patient ID: Evelyn Goodwin, female   DOB: [birth year], 24 y.o.   MRN: [id number]       Subjective: Had a little nausea, but is really hungry and thinks solid food will help.  Had some diarrhea last night.  Seems as if pain is improving as best as I can tell.  ROS: See above, otherwise other systems negative  Objective: Vital signs in last 24 hours: Temp:  [98 F (36.7 C)-99 F (37.2 C)] 99 F (37.2 C) (04/15 0700) Pulse Rate:  [63-80] 65 (04/15 0700) Resp:  [16-18] 16 (04/15 0700) BP: (103-131)/(76-89) 103/76 (04/15 0700) SpO2:  [99 %-100 %] 99 % (04/15 0700) Last BM Date: 05/01/19  Intake/Output from previous day: 04/14 0701 - 04/15 0700 In: 1573.3 [P.O.:780; I.V.:644.6; IV Piggyback:148.7] Out: -  Intake/Output this shift: No intake/output data recorded.  PE: Gen: NAD Heart: regular Lungs: CTAB Abd: soft, mild RLQ tenderness, +BS, ND Psych A&Ox3  Lab Results:  Recent Labs    05/01/19 0634 05/03/19 0359  WBC 9.4 9.7  HGB 11.2* 11.4*  HCT 35.4* 36.2  PLT 398 410*   BMET Recent Labs    05/01/19 0634 05/03/19 0359  NA 139 136  K 3.9 3.8  CL 108 106  CO2 21* 21*  GLUCOSE 84 84  BUN 5* <5*  CREATININE 0.66 0.56  CALCIUM 8.4* 8.3*   PT/INR No results for input(s): LABPROT, INR in the last 72 hours. CMP     Component Value Date/Time   NA 136 05/03/2019 0359   K 3.8 05/03/2019 0359   CL 106 05/03/2019 0359   CO2 21 (L) 05/03/2019 0359   GLUCOSE 84 05/03/2019 0359   BUN <5 (L) 05/03/2019 0359   CREATININE 0.56 05/03/2019 0359   CALCIUM 8.3 (L) 05/03/2019 0359   PROT 8.5 (H) 04/30/2019 1756   ALBUMIN 3.8 04/30/2019 1756   AST 17 04/30/2019 1756   ALT 20 04/30/2019 1756   ALKPHOS 69 04/30/2019 1756   BILITOT 0.4 04/30/2019 1756   GFRNONAA >60 05/03/2019 0359   GFRAA >60 05/03/2019 0359   Lipase     Component Value Date/Time   LIPASE 39 04/30/2019 1756       Studies/Results: No results found.  Anti-infectives: Anti-infectives (From  admission, onward)   Start     Dose/Rate Route Frequency Ordered Stop   05/03/19 0830  ciprofloxacin (CIPRO) tablet 500 mg     500 mg Oral 2 times daily 05/03/19 0818     05/03/19 0830  metroNIDAZOLE (FLAGYL) tablet 500 mg     500 mg Oral Every 8 hours 05/03/19 0818     05/01/19 2200  cefTRIAXone (ROCEPHIN) 2 g in sodium chloride 0.9 % 100 mL IVPB  Status:  Discontinued     2 g 200 mL/hr over 30 Minutes Intravenous Every 24 hours 04/30/19 2302 05/03/19 0817   05/01/19 0600  metroNIDAZOLE (FLAGYL) IVPB 500 mg  Status:  Discontinued     500 mg 100 mL/hr over 60 Minutes Intravenous Every 8 hours 04/30/19 2302 05/03/19 0817   04/30/19 2015  cefTRIAXone (ROCEPHIN) 2 g in sodium chloride 0.9 % 100 mL IVPB     2 g 200 mL/hr over 30 Minutes Intravenous  Once 04/30/19 2014 04/30/19 2158   04/30/19 2015  metroNIDAZOLE (FLAGYL) IVPB 500 mg     500 mg 100 mL/hr over 60 Minutes Intravenous  Once 04/30/19 2014 04/30/19 2229       Assessment/Plan Anxiety - prn ativan ordered Depression- home meds  ADD - home meds  Acute perforated appendicitis with small abscess  -fluid collection is too small to drain -labs are normal -adv to soft diet -IV not working, will transition to oral abx, oral meds, etc.  SLIV -if doing well with soft diet, may DC home later today or tomorrow  FEN -soft diet VTE -lovenox ID -Rocephin/Flagyl stopped 4/15, Cipro/Flagyl 4/15 -->   LOS: 3 days    Henreitta Cea , [facility] 05/03/2019, 8:31 AM Please see Amion for pager number during day hours 7:00am-4:30pm or 7:00am -11:30am on weekends

## 2019-05-03 NOTE — Plan of Care (Signed)
  Problem: Education: Goal: Knowledge of General Education information will improve Description: Including pain rating scale, medication(s)/side effects and non-pharmacologic comfort measures 05/03/2019 1519 by Lisbeth Renshaw, RN Outcome: Adequate for Discharge 05/03/2019 1123 by Lisbeth Renshaw, RN Outcome: Progressing   Problem: Health Behavior/Discharge Planning: Goal: Ability to manage health-related needs will improve 05/03/2019 1519 by Lisbeth Renshaw, RN Outcome: Adequate for Discharge 05/03/2019 1123 by Lisbeth Renshaw, RN Outcome: Progressing   Problem: Clinical Measurements: Goal: Ability to maintain clinical measurements within normal limits will improve 05/03/2019 1519 by Lisbeth Renshaw, RN Outcome: Adequate for Discharge 05/03/2019 1123 by Lisbeth Renshaw, RN Outcome: Progressing Goal: Will remain free from infection Outcome: Adequate for Discharge Goal: Diagnostic test results will improve 05/03/2019 1519 by Lisbeth Renshaw, RN Outcome: Adequate for Discharge 05/03/2019 1123 by Lisbeth Renshaw, RN Outcome: Progressing Goal: Respiratory complications will improve 05/03/2019 1519 by Lisbeth Renshaw, RN Outcome: Adequate for Discharge 05/03/2019 1123 by Lisbeth Renshaw, RN Outcome: Progressing Goal: Cardiovascular complication will be avoided 05/03/2019 1519 by Lisbeth Renshaw, RN Outcome: Adequate for Discharge 05/03/2019 1123 by Lisbeth Renshaw, RN Outcome: Progressing   Problem: Activity: Goal: Risk for activity intolerance will decrease 05/03/2019 1519 by Lisbeth Renshaw, RN Outcome: Adequate for Discharge 05/03/2019 1123 by Lisbeth Renshaw, RN Outcome: Progressing   Problem: Nutrition: Goal: Adequate nutrition will be maintained 05/03/2019 1519 by Lisbeth Renshaw, RN Outcome: Adequate for Discharge 05/03/2019 1123 by Lisbeth Renshaw, RN Outcome: Progressing   Problem: Coping: Goal: Level of anxiety will decrease 05/03/2019 1519 by Lisbeth Renshaw, RN Outcome: Adequate for  Discharge 05/03/2019 1123 by Lisbeth Renshaw, RN Outcome: Progressing   Problem: Elimination: Goal: Will not experience complications related to bowel motility 05/03/2019 1519 by Lisbeth Renshaw, RN Outcome: Adequate for Discharge 05/03/2019 1123 by Lisbeth Renshaw, RN Outcome: Progressing Goal: Will not experience complications related to urinary retention 05/03/2019 1519 by Lisbeth Renshaw, RN Outcome: Adequate for Discharge 05/03/2019 1123 by Lisbeth Renshaw, RN Outcome: Progressing   Problem: Pain Managment: Goal: General experience of comfort will improve 05/03/2019 1519 by Lisbeth Renshaw, RN Outcome: Adequate for Discharge 05/03/2019 1123 by Lisbeth Renshaw, RN Outcome: Progressing   Problem: Safety: Goal: Ability to remain free from injury will improve 05/03/2019 1519 by Lisbeth Renshaw, RN Outcome: Adequate for Discharge 05/03/2019 1123 by Lisbeth Renshaw, RN Outcome: Progressing   Problem: Skin Integrity: Goal: Risk for impaired skin integrity will decrease 05/03/2019 1519 by Lisbeth Renshaw, RN Outcome: Adequate for Discharge 05/03/2019 1123 by Lisbeth Renshaw, RN Outcome: Progressing

## 2019-05-03 NOTE — Discharge Instructions (Signed)
Appendicitis, Adult  The appendix is a tube in the body that is shaped like a finger. It is attached to the large intestine. Appendicitis means that this tube is swollen (inflamed). If this is not treated, the tube can tear (rupture). This can lead to a life-threatening infection. This condition can also cause pus to build up in the appendix (abscess). What are the causes? This condition may be caused by something that blocks the appendix. These include:  A ball of poop (stool).  Lymph glands that are bigger than normal. Sometimes the cause is not known. What increases the risk? You are more likely to develop this condition if you are between 3 and 51 years of age. What are the signs or symptoms? Symptoms of this condition include:  Pain around the belly button (navel). ? The pain moves toward the lower right belly (abdomen). ? The pain can get worse with time. ? The pain can get worse if you cough. ? The pain can get worse if you move suddenly.  Tenderness in the lower right belly.  Feeling sick to your stomach (nauseous).  Throwing up (vomiting).  Not feeling hungry (loss of appetite).  A fever.  Having trouble pooping (constipation).  Watery poop (diarrhea).  Not feeling well. How is this treated? Most often, this condition is treated by taking out the appendix (appendectomy). There are two ways to do this:  Open surgery. For this method, the appendix is taken out through a large cut (incision). The cut is made in the lower right belly. This surgery may be used if: ? You have scars from another surgery. ? You have a bleeding condition. ? You are pregnant and will be having your baby soon. ? You have a condition that makes it hard to do the other type of surgery.  Laparoscopic surgery. For this method, the appendix is taken out through small cuts. Often, this surgery: ? Causes less pain. ? Causes fewer problems. ? Is easier to heal from. If your appendix tears and  pus forms:  A drain may be put into the sore. The drain will be used to get rid of the pus.  You may get an antibiotic medicine through an IV line.  Your appendix may or may not need to be taken out. Follow these instructions at home: If you had surgery, follow instructions from your doctor on how to care for yourself at home and how to take care of your cut from surgery. Medicines  Take over-the-counter and prescription medicines only as told by your doctor.  If you were prescribed an antibiotic medicine, take it as told by your doctor. Do not stop taking the antibiotic even if you start to feel better. Eating and drinking Follow instructions from your doctor about what you cannot eat or drink. You may go back to your diet slowly if:  You no longer feel sick to your stomach.  You have stopped throwing up. General instructions  Do not use any products that contain nicotine or tobacco, such as cigarettes, e-cigarettes, and chewing tobacco. If you need help quitting, ask your doctor.  Do not drive or use heavy machinery while taking prescription pain medicine.  Ask your doctor if the medicine you are taking can cause trouble pooping. You may need to take steps to prevent or treat trouble pooping: ? Drink enough fluid to keep your pee (urine) pale yellow. ? Take over-the-counter or prescription medicines. ? Eat foods that are high in fiber. These include beans,  whole grains, and fresh fruits and vegetables. ? Limit foods that are high in fat and sugar. These include fried or sweet foods.  Keep all follow-up visits as told by your doctor. This is important. Contact a doctor if:  There is pus, blood, or a lot of fluid coming from your cut or cuts from surgery.  You are sick to your stomach or you throw up. Get help right away if:  You have pain in your belly, and the pain is getting worse.  You have a fever.  You have chills.  You are very tired.  You have muscle  pain.  You are short of breath. Summary  Appendicitis is swelling of the appendix. The appendix is a tube that is shaped like a finger. It is joined to the large intestine.  This condition may be caused by something that blocks the appendix. This can lead to an infection.  This condition is usually treated by taking out the appendix. This information is not intended to replace advice given to you by your health care provider. Make sure you discuss any questions you have with your health care provider. Document Revised: 06/22/2017 Document Reviewed: 06/22/2017 Elsevier Patient Education  [place].

## 2019-05-03 NOTE — Final Progress Note (Signed)
PIV removed. AVS discussed with patient, all questions and concerns answered. Discharged home self care. Education provided on importance of taking antibiotics as prescribed. Discharged with all belongings per patient.

## 2019-05-03 NOTE — Discharge Summary (Signed)
Patient ID: Evelyn Goodwin [id number] 05-06-1995 24 y.o.  Admit date: 04/30/2019 Discharge date: 05/03/2019  Admitting Diagnosis: Acute perforated appendicitis with small abscess  Discharge Diagnosis Patient Active Problem List   Diagnosis Date Noted  . Ruptured appendicitis 04/30/2019  . Anxiety and depression 10/11/2018  . Other idiopathic scoliosis, lumbar region 10/11/2018  . History of ADHD 10/11/2018  . 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  She complains of epigastric discomfort and has had some significant anorexia.  She has not been able to eat much.  She continued to have abdominal pain and underwent an outpatient CT scan today which showed ruptured appendicitis with a small abscess.  I was asked to see her for surgical management.  She complains of some anxiety regarding the situation as well.  Procedures none  Hospital Course:  The patient was admitted and placed on abx therapy.  IR reviewed her imaging and the abscess was too small to drain.  She was initially kept NPO, but improved and her diet was advanced as tolerated and her pain improved.  She was transitioned to oral abx therapy and was stable for DC home on HD 3.  Physical Exam: See progress note from earlier today  Allergies as of 05/03/2019      Reactions   Penicillin G Rash      Medication List    STOP taking these medications   HYDROcodone-acetaminophen 5-325 MG tablet Commonly known as: NORCO/VICODIN     TAKE these medications   acetaminophen 500 MG tablet Commonly known as: TYLENOL Take 2 tablets (1,000 mg total) by mouth every 6 (six) hours as needed.   amphetamine-dextroamphetamine 20 MG 24 hr capsule Commonly known as: ADDERALL XR Take 20 mg by mouth as needed (work days).   ciprofloxacin 500 MG tablet Commonly known  as: CIPRO Take 1 tablet (500 mg total) by mouth 2 (two) times daily for 12 days.   clotrimazole-betamethasone cream Commonly known as: LOTRISONE Apply 1 application topically 2 (two) times daily.   Cosentyx (300 MG Dose) 150 MG/ML Sosy Generic drug: Secukinumab (300 MG Dose) Inject 300 mg into the skin every 30 (thirty) days.   cyclobenzaprine 5 MG tablet Commonly known as: FLEXERIL Take 5 mg by mouth at bedtime as needed.   dicyclomine 20 MG tablet Commonly known as: BENTYL Take 20 mg by mouth 4 (four) times daily as needed for spasms.   DULoxetine 20 MG capsule Commonly known as: CYMBALTA Take 20 mg by mouth 2 (two) times daily.   halobetasol 0.05 % cream Commonly known as: ULTRAVATE Apply topically 2 (two) times daily as needed for up to 14 days.   ibuprofen 600 MG tablet Commonly known as: ADVIL Take 1 tablet (600 mg total) by mouth every 6 (six) hours as needed for headache, mild pain or moderate pain.   Junel 1/20 1-20 MG-MCG tablet Generic drug: norethindrone-ethinyl estradiol Take 1 tablet by mouth at bedtime.   metoprolol succinate 25 MG 24 hr tablet Commonly known as: Toprol XL Take 1 tablet (25 mg total) by mouth at bedtime.   metroNIDAZOLE 500 MG tablet Commonly known as: FLAGYL Take 1 tablet (500 mg total) by mouth every 8 (eight) hours for 12 days.   ondansetron 4 MG disintegrating tablet Commonly known as: ZOFRAN-ODT Take 4 mg by mouth every 8 (  eight) hours as needed for nausea or vomiting. What changed: Another medication with the same name was added. Make sure you understand how and when to take each.   ondansetron 4 MG disintegrating tablet Commonly known as: ZOFRAN-ODT Take 1 tablet (4 mg total) by mouth every 6 (six) hours as needed for nausea. What changed: You were already taking a medication with the same name, and this prescription was added. Make sure you understand how and when to take each.   oxyCODONE 5 MG immediate release tablet  Commonly known as: Oxy IR/ROXICODONE Take 1 tablet (5 mg total) by mouth every 6 (six) hours as needed for moderate pain.        Follow-up Information    Ralene Ok, MD Follow up on 05/17/2019.   Specialty: General Surgery Why: 10:00  arrive 9:30 for paperwork and check in process.  please bring insurance card and photo ID Contact information: [address] [place] [address] [phone]           Signed: Saverio Danker, [facility] Surgery 05/03/2019, 2:03 PM Please see Amion for pager number during day hours 7:00am-4:30pm, 7-11:30am on Weekends

## 2019-05-05 ENCOUNTER — Encounter (HOSPITAL_COMMUNITY): Payer: Self-pay | Admitting: Emergency Medicine

## 2019-05-05 ENCOUNTER — Other Ambulatory Visit: Payer: Self-pay

## 2019-05-05 ENCOUNTER — Inpatient Hospital Stay (HOSPITAL_COMMUNITY)
Admission: EM | Admit: 2019-05-05 | Discharge: 2019-05-08 | Disposition: A | Discharge status: Home / Self Care | Payer: BC Managed Care – PPO | Source: Home / Self Care

## 2019-05-05 DIAGNOSIS — K3532 Acute appendicitis with perforation and localized peritonitis, without abscess: Secondary | ICD-10-CM | POA: Diagnosis present

## 2019-05-05 LAB — CBC
HCT: 41.8 % (ref 36.0–46.0)
Hemoglobin: 13.4 g/dL (ref 12.0–15.0)
MCH: 27.7 pg (ref 26.0–34.0)
MCHC: 32.1 g/dL (ref 30.0–36.0)
MCV: 86.5 fL (ref 80.0–100.0)
Platelets: 513 10*3/uL — ABNORMAL HIGH (ref 150–400)
RBC: 4.83 MIL/uL (ref 3.87–5.11)
RDW: 14.7 % (ref 11.5–15.5)
WBC: 14.7 10*3/uL — ABNORMAL HIGH (ref 4.0–10.5)
nRBC: 0 % (ref 0.0–0.2)

## 2019-05-05 LAB — URINALYSIS, ROUTINE W REFLEX MICROSCOPIC
Bacteria, UA: NONE SEEN
Bilirubin Urine: NEGATIVE
Glucose, UA: NEGATIVE mg/dL
Hgb urine dipstick: NEGATIVE
Ketones, ur: 80 mg/dL — AB
Nitrite: NEGATIVE
Protein, ur: 100 mg/dL — AB
Specific Gravity, Urine: 1.024 (ref 1.005–1.030)
pH: 6 (ref 5.0–8.0)

## 2019-05-05 LAB — COMPREHENSIVE METABOLIC PANEL
ALT: 56 U/L — ABNORMAL HIGH (ref 0–44)
AST: 45 U/L — ABNORMAL HIGH (ref 15–41)
Albumin: 3.8 g/dL (ref 3.5–5.0)
Alkaline Phosphatase: 63 U/L (ref 38–126)
Anion gap: 9 (ref 5–15)
BUN: 8 mg/dL (ref 6–20)
CO2: 23 mmol/L (ref 22–32)
Calcium: 9.2 mg/dL (ref 8.9–10.3)
Chloride: 101 mmol/L (ref 98–111)
Creatinine, Ser: 0.72 mg/dL (ref 0.44–1.00)
GFR calc Af Amer: >60 mL/min (ref >60)
GFR calc non Af Amer: >60 mL/min (ref >60)
Glucose, Bld: 106 mg/dL — ABNORMAL HIGH (ref 70–99)
Potassium: 3.3 mmol/L — ABNORMAL LOW (ref 3.5–5.1)
Sodium: 133 mmol/L — ABNORMAL LOW (ref 135–145)
Total Bilirubin: 0.5 mg/dL (ref 0.3–1.2)
Total Protein: 8 g/dL (ref 6.5–8.1)

## 2019-05-05 LAB — I-STAT BETA HCG BLOOD, ED (MC, WL, AP ONLY): I-stat hCG, quantitative: <5 m[IU]/mL (ref <5)

## 2019-05-05 LAB — LIPASE, BLOOD: Lipase: 31 U/L (ref 11–51)

## 2019-05-05 MED ORDER — SODIUM CHLORIDE 0.9% FLUSH: 3.0000 mL | Freq: Once | INTRAVENOUS | Status: DC

## 2019-05-05 NOTE — ED Triage Notes (Signed)
Pt reports abdominal pain has increased, "I want my appendix out now."  Pt state emesis.

## 2019-05-06 ENCOUNTER — Emergency Department (HOSPITAL_COMMUNITY): Payer: BC Managed Care – PPO

## 2019-05-06 DIAGNOSIS — K3532 Acute appendicitis with perforation and localized peritonitis, without abscess: Secondary | ICD-10-CM | POA: Diagnosis present

## 2019-05-06 LAB — RESPIRATORY PANEL BY RT PCR (FLU A&B, COVID)
Influenza A by PCR: NEGATIVE
Influenza B by PCR: NEGATIVE
SARS Coronavirus 2 by RT PCR: NEGATIVE

## 2019-05-06 MED ORDER — DIPHENHYDRAMINE HCL 50 MG/ML IJ SOLN: 25.0000 mg | Freq: Four times a day (QID) | INTRAMUSCULAR | Status: DC | PRN

## 2019-05-06 MED ORDER — METOPROLOL TARTRATE 5 MG/5ML IV SOLN: 5.0000 mg | Freq: Four times a day (QID) | INTRAVENOUS | Status: DC | PRN

## 2019-05-06 MED ORDER — IOHEXOL 300 MG/ML  SOLN
100.0000 mL | Freq: Once | INTRAMUSCULAR | Status: AC | PRN
  Administered 2019-05-06: 100 mL via INTRAVENOUS

## 2019-05-06 MED ORDER — KCL IN DEXTROSE-NACL 20-5-0.45 MEQ/L-%-% IV SOLN
INTRAVENOUS | Status: DC
  Filled 2019-05-06 (×5): qty 1000

## 2019-05-06 MED ORDER — SODIUM CHLORIDE 0.9 % IV BOLUS
1000.0000 mL | Freq: Once | INTRAVENOUS | Status: AC
  Administered 2019-05-06: 1000 mL via INTRAVENOUS

## 2019-05-06 MED ORDER — AMPHETAMINE-DEXTROAMPHET ER 20 MG PO CP24: 20.0000 mg | ORAL_CAPSULE | ORAL | Status: DC | PRN

## 2019-05-06 MED ORDER — OXYCODONE HCL 5 MG PO TABS
5.0000 mg | ORAL_TABLET | ORAL | Status: DC | PRN
  Administered 2019-05-07: 10 mg via ORAL
  Filled 2019-05-06: qty 1
  Filled 2019-05-06: qty 2

## 2019-05-06 MED ORDER — POTASSIUM CHLORIDE CRYS ER 20 MEQ PO TBCR: 40.0000 meq | EXTENDED_RELEASE_TABLET | Freq: Once | ORAL | Status: DC

## 2019-05-06 MED ORDER — DIPHENHYDRAMINE HCL 25 MG PO CAPS
25.0000 mg | ORAL_CAPSULE | Freq: Four times a day (QID) | ORAL | Status: DC | PRN
  Administered 2019-05-06: 25 mg via ORAL
  Filled 2019-05-06: qty 1

## 2019-05-06 MED ORDER — MORPHINE SULFATE (PF) 2 MG/ML IV SOLN
1.0000 mg | INTRAVENOUS | Status: DC | PRN
  Administered 2019-05-07: 2 mg via INTRAVENOUS
  Filled 2019-05-06: qty 1

## 2019-05-06 MED ORDER — ENOXAPARIN SODIUM 40 MG/0.4ML ~~LOC~~ SOLN: 40.0000 mg | SUBCUTANEOUS | Status: DC

## 2019-05-06 MED ORDER — METRONIDAZOLE IN NACL 5-0.79 MG/ML-% IV SOLN
500.0000 mg | Freq: Three times a day (TID) | INTRAVENOUS | Status: DC
  Administered 2019-05-06 – 2019-05-08 (×6): 500 mg via INTRAVENOUS
  Filled 2019-05-06 (×6): qty 100

## 2019-05-06 MED ORDER — MORPHINE SULFATE (PF) 4 MG/ML IV SOLN
4.0000 mg | Freq: Once | INTRAVENOUS | Status: AC
  Administered 2019-05-06: 4 mg via INTRAVENOUS
  Filled 2019-05-06: qty 1

## 2019-05-06 MED ORDER — ONDANSETRON 4 MG PO TBDP: 4.0000 mg | ORAL_TABLET | Freq: Four times a day (QID) | ORAL | Status: DC | PRN

## 2019-05-06 MED ORDER — IBUPROFEN 600 MG PO TABS: 600.0000 mg | ORAL_TABLET | Freq: Three times a day (TID) | ORAL | Status: DC | PRN

## 2019-05-06 MED ORDER — ONDANSETRON HCL 4 MG/2ML IJ SOLN
4.0000 mg | Freq: Four times a day (QID) | INTRAMUSCULAR | Status: DC | PRN
  Administered 2019-05-07: 4 mg via INTRAVENOUS
  Filled 2019-05-06: qty 2

## 2019-05-06 MED ORDER — POTASSIUM CHLORIDE 10 MEQ/100ML IV SOLN
10.0000 meq | Freq: Once | INTRAVENOUS | Status: AC
  Administered 2019-05-06: 10 meq via INTRAVENOUS
  Filled 2019-05-06: qty 100

## 2019-05-06 MED ORDER — DULOXETINE HCL 20 MG PO CPEP
20.0000 mg | ORAL_CAPSULE | Freq: Two times a day (BID) | ORAL | Status: DC
  Administered 2019-05-06 – 2019-05-08 (×3): 20 mg via ORAL
  Filled 2019-05-06 (×6): qty 1

## 2019-05-06 MED ORDER — ACETAMINOPHEN 500 MG PO TABS: 1000.0000 mg | ORAL_TABLET | Freq: Four times a day (QID) | ORAL | Status: DC | PRN

## 2019-05-06 MED ORDER — SIMETHICONE 80 MG PO CHEW: 40.0000 mg | CHEWABLE_TABLET | Freq: Four times a day (QID) | ORAL | Status: DC | PRN

## 2019-05-06 MED ORDER — SODIUM CHLORIDE 0.9 % IV SOLN
2.0000 g | INTRAVENOUS | Status: DC
  Administered 2019-05-06 – 2019-05-07 (×2): 2 g via INTRAVENOUS
  Filled 2019-05-06 (×2): qty 2
  Filled 2019-05-06: qty 20

## 2019-05-06 MED ORDER — METHOCARBAMOL 1000 MG/10ML IJ SOLN
500.0000 mg | Freq: Four times a day (QID) | INTRAVENOUS | Status: DC | PRN
  Filled 2019-05-06 (×2): qty 5

## 2019-05-06 NOTE — ED Notes (Signed)
Pt restful with NAD, although pain rated at a "9".   Pt has abnormal pain responses to IV starts with yelling and pulling away.

## 2019-05-06 NOTE — ED Provider Notes (Addendum)
[facility] EMERGENCY DEPARTMENT Provider Note   CSN: [id number] Arrival date & time: 05/05/19  2104     History Chief Complaint  Patient presents with  . Abdominal Pain    Evelyn Goodwin is a 24 y.o. female who was recently evaluated in the ED on 04/30/2019 for acute perforated appendicitis with small abscess.  Patient was evaluated by Dr. Georganna Skeans, MD and plan was for admission, IV antibiotics, bowel rest, and delayed surgical intervention.  Given that there is evidence of perforation, surgical intervention subjected her to high risk of colectomy and surgical complication.  Patient was ultimately discharged 05/03/2019 with antibiotics, antiemetics, and analgesics.  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  She reports numerous episodes of bilious vomiting.  Currently her pain is 100 out of 10.  She endorses diminished appetite.  She is concerned about worsening abscess.  While she understands that the goal was to hold off 6 to 8 weeks prior to reevaluation for surgical intervention, she states that her pain has become intolerable despite appropriate narcotic medication.  She denies any fevers or chills, chest pain or difficulty breathing, difficulty urinating or defecating, hematemesis, hematochezia, or other symptoms.  HPI     Past Medical History:  Diagnosis Date  . Anxiety   . Depression   . Psoriasis   . Scoliosis     Patient Active Problem List   Diagnosis Date Noted  . Acute perforated appendicitis 05/06/2019  . Ruptured appendicitis 04/30/2019  . Anxiety and depression 10/11/2018  . Other idiopathic scoliosis, lumbar region 10/11/2018  . History of ADHD 10/11/2018  . 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 Psoriatic arthritis ([place]) 10/11/2018    Past Surgical History:  Procedure Laterality Date  . WISDOM  TOOTH EXTRACTION  2014     OB History   No obstetric history on file.     Family History  Problem Relation Age of Onset  . Psoriasis Mother   . Healthy Brother     Social History   Tobacco Use  . Smoking status: Never Smoker  . Smokeless tobacco: Never Used  Substance Use Topics  . Alcohol use: Yes    Comment: Rare  . Drug use: Not Currently    Home Medications Prior to Admission medications   Medication Sig Start Date End Date Taking? Authorizing Provider  acetaminophen (TYLENOL) 500 MG tablet Take 2 tablets (1,000 mg total) by mouth every 6 (six) hours as needed. 05/03/19   Saverio Danker, PA-C  amphetamine-dextroamphetamine (ADDERALL XR) 20 MG 24 hr capsule Take 20 mg by mouth as needed (work days).  10/13/18   [provider]  ciprofloxacin (CIPRO) 500 MG tablet Take 1 tablet (500 mg total) by mouth 2 (two) times daily for 12 days. 05/03/19 05/15/19  Saverio Danker, PA-C  clotrimazole-betamethasone (LOTRISONE) cream Apply 1 application topically 2 (two) times daily.  10/09/18   [provider]  cyclobenzaprine (FLEXERIL) 5 MG tablet Take 5 mg by mouth at bedtime as needed. 04/20/19   [provider]  dicyclomine (BENTYL) 20 MG tablet Take 20 mg by mouth 4 (four) times daily as needed for spasms.  04/20/19   [provider]  DULoxetine (CYMBALTA) 20 MG capsule Take 20 mg by mouth 2 (two) times daily. 04/18/19   [provider]  ibuprofen (ADVIL) 600 MG tablet Take 1 tablet (600  mg total) by mouth every 6 (six) hours as needed for headache, mild pain or moderate pain. 05/03/19   Saverio Danker, PA-C  metoprolol succinate (TOPROL XL) 25 MG 24 hr tablet Take 1 tablet (25 mg total) by mouth at bedtime. Patient not taking: Reported on 04/30/2019 03/12/19   Lelon Perla, MD  metroNIDAZOLE (FLAGYL) 500 MG tablet Take 1 tablet (500 mg total) by mouth every 8 (eight) hours for 12 days. 05/03/19 05/15/19  Saverio Danker, PA-C  norethindrone-ethinyl  estradiol (JUNEL 1/20) 1-20 MG-MCG tablet Take 1 tablet by mouth at bedtime.  08/17/18   [provider]  ondansetron (ZOFRAN-ODT) 4 MG disintegrating tablet Take 4 mg by mouth every 8 (eight) hours as needed for nausea or vomiting.  04/20/19   [provider]  ondansetron (ZOFRAN-ODT) 4 MG disintegrating tablet Take 1 tablet (4 mg total) by mouth every 6 (six) hours as needed for nausea. 05/03/19   Saverio Danker, PA-C  oxyCODONE (OXY IR/ROXICODONE) 5 MG immediate release tablet Take 1 tablet (5 mg total) by mouth every 6 (six) hours as needed for moderate pain. 05/03/19   Saverio Danker, PA-C  Secukinumab, 300 MG Dose, (COSENTYX, 300 MG DOSE,) 150 MG/ML SOSY Inject 300 mg into the skin every 30 (thirty) days.    [provider]    Allergies    Penicillin g  Review of Systems   Review of Systems  All other systems reviewed and are negative.   Physical Exam Updated Vital Signs BP 115/79   Pulse 93   Temp 99.1 F (37.3 C) (Oral)   Resp 16   Ht 5' 7"  (1.702 m)   Wt 52.2 kg   SpO2 99%   BMI 18.01 kg/m   Physical Exam Vitals and nursing note reviewed. Exam conducted with a chaperone present.  HENT:     Head: Normocephalic and atraumatic.  Eyes:     General: No scleral icterus.    Conjunctiva/sclera: Conjunctivae normal.  Cardiovascular:     Rate and Rhythm: Normal rate and regular rhythm.     Pulses: Normal pulses.     Heart sounds: Normal heart sounds.  Pulmonary:     Effort: Pulmonary effort is normal.     Breath sounds: Normal breath sounds.  Abdominal:     Comments: Soft, nondistended.  Significant tenderness to palpation over McBurney's point and RLQ.  No significant TTP elsewhere.  No overlying skin changes.  Normoactive bowel sounds.  Negative Rovsing sign.  Musculoskeletal:     Cervical back: Normal range of motion.     Right lower leg: No edema.     Left lower leg: No edema.  Skin:    General: Skin is dry.     Capillary Refill: Capillary  refill takes less than 2 seconds.  Neurological:     Mental Status: She is alert and oriented to person, place, and time.     GCS: GCS eye subscore is 4. GCS verbal subscore is 5. GCS motor subscore is 6.  Psychiatric:        Mood and Affect: Mood normal.        Behavior: Behavior normal.        Thought Content: Thought content normal.      ED Results / Procedures / Treatments   Labs (all labs ordered are listed, but only abnormal results are displayed) Labs Reviewed  COMPREHENSIVE METABOLIC PANEL - Abnormal; Notable for the following components:      Result Value   Sodium 133 (*)  Potassium 3.3 (*)    Glucose, Bld 106 (*)    AST 45 (*)    ALT 56 (*)    All other components within normal limits  CBC - Abnormal; Notable for the following components:   WBC 14.7 (*)    Platelets 513 (*)    All other components within normal limits  URINALYSIS, ROUTINE W REFLEX MICROSCOPIC - Abnormal; Notable for the following components:   Color, Urine AMBER (*)    APPearance HAZY (*)    Ketones, ur 80 (*)    Protein, ur 100 (*)    Leukocytes,Ua SMALL (*)    Non Squamous Epithelial 0-5 (*)    All other components within normal limits  RESPIRATORY PANEL BY RT PCR (FLU A&B, COVID)  LIPASE, BLOOD  I-STAT BETA HCG BLOOD, ED ([facility], [facility], [facility] ONLY)    EKG None  Radiology CT ABDOMEN PELVIS W CONTRAST  Result Date: 05/06/2019 CLINICAL DATA:  Known appendicitis. Emesis. Progressive abdominal pain. EXAM: CT ABDOMEN AND PELVIS WITH CONTRAST TECHNIQUE: Multidetector CT imaging of the abdomen and pelvis was performed using the standard protocol following bolus administration of intravenous contrast. CONTRAST:  150m OMNIPAQUE IOHEXOL 300 MG/ML  SOLN COMPARISON:  04/30/2019 FINDINGS: Lower chest: Clear lung bases. Normal heart size without pericardial or pleural effusion. Hepatobiliary: Focal steatosis adjacent the falciform ligament. Small gallstones versus vicarious excretion of contrast by the  gallbladder on 33/3. Absence on the prior favors vicarious excretion. No acute cholecystitis or biliary duct dilatation. Pancreas: Normal, without mass or ductal dilatation. Spleen: Normal in size, without focal abnormality. Adrenals/Urinary Tract: Normal adrenal glands. Too small to characterize left renal lesion. Normal right kidney, without hydronephrosis. Normal urinary bladder. Stomach/Bowel: Normal stomach, without wall thickening. Normal colon and terminal ileum. The inflamed appendix is again identified, including on image 60 through 65. There is again a complex collection surrounding the appendiceal tip, including at 2.5 x 3.0 cm on 72/3. Compare 2.4 x 2.2 cm on the prior exam (when remeasured). Normal small bowel caliber. Vascular/Lymphatic: Normal caliber of the aorta and branch vessels. No abdominopelvic adenopathy. Reproductive: Normal uterus and adnexa. Other: Increase in small volume cul-de-sac fluid with subtle peritoneal thickening, including on 72/3. Musculoskeletal: No acute osseous abnormality. Convex right lumbar spine curvature. IMPRESSION: Redemonstration of appendicitis with enlargement of periappendiceal phlegmon. Increase in pelvic fluid with peritoneal enhancement, likely due to infected ascites. Electronically Signed   By: KAbigail MiyamotoM.D.   On: 05/06/2019 10:19    Procedures .Critical Care Performed by: GCorena Herter PA-C Authorized by: GCorena Herter PA-C   Critical care provider statement:    Critical care time (minutes):  45   Critical care was necessary to treat or prevent imminent or life-threatening deterioration of the following conditions:  Sepsis   Critical care was time spent personally by me on the following activities:  Discussions with consultants, evaluation of patient's response to treatment, examination of patient, ordering and performing treatments and interventions, ordering and review of laboratory studies, ordering and review of radiographic  studies, pulse oximetry, re-evaluation of patient's condition, obtaining history from patient or surrogate, review of old charts and blood draw for specimens   (including critical care time)  Medications Ordered in ED Medications  sodium chloride flush (NS) 0.9 % injection 3 mL (has no administration in time range)  acetaminophen (TYLENOL) tablet 1,000 mg (has no administration in time range)  ibuprofen (ADVIL) tablet 600 mg (has no administration in time range)  oxyCODONE (Oxy IR/ROXICODONE) immediate  release tablet 5-10 mg (has no administration in time range)  amphetamine-dextroamphetamine (ADDERALL XR) 24 hr capsule 20 mg (has no administration in time range)  DULoxetine (CYMBALTA) DR capsule 20 mg (has no administration in time range)  enoxaparin (LOVENOX) injection 40 mg (has no administration in time range)  dextrose 5 % and 0.45 % NaCl with KCl 20 mEq/L infusion (has no administration in time range)  cefTRIAXone (ROCEPHIN) 2 g in sodium chloride 0.9 % 100 mL IVPB (has no administration in time range)    And  metroNIDAZOLE (FLAGYL) IVPB 500 mg (has no administration in time range)  morphine 2 MG/ML injection 1-4 mg (has no administration in time range)  methocarbamol (ROBAXIN) 500 mg in dextrose 5 % 50 mL IVPB (has no administration in time range)  diphenhydrAMINE (BENADRYL) capsule 25 mg (has no administration in time range)    Or  diphenhydrAMINE (BENADRYL) injection 25 mg (has no administration in time range)  ondansetron (ZOFRAN-ODT) disintegrating tablet 4 mg (has no administration in time range)    Or  ondansetron (ZOFRAN) injection 4 mg (has no administration in time range)  simethicone (MYLICON) chewable tablet 40 mg (has no administration in time range)  metoprolol tartrate (LOPRESSOR) injection 5 mg (has no administration in time range)  potassium chloride 10 mEq in 100 mL IVPB (has no administration in time range)  sodium chloride 0.9 % bolus 1,000 mL (1,000 mLs  Intravenous New Bag/Given 05/06/19 0848)  morphine 4 MG/ML injection 4 mg (4 mg Intravenous Given 05/06/19 0852)  iohexol (OMNIPAQUE) 300 MG/ML solution 100 mL (100 mLs Intravenous Contrast Given 05/06/19 0930)    ED Course  I have reviewed the triage vital signs and the nursing notes.  Pertinent labs & imaging results that were available during my care of the patient were reviewed by me and considered in my medical decision making (see chart for details).  Clinical Course as of May 06 1115  Sun May 06, 2019  1041 Spoke with Dr. Georgette Dover who will evaluate patient for incision and drainage.    [GG]    Clinical Course User Index [GG] Corena Herter, PA-C   MDM Rules/Calculators/A&P                      Patient's history and physical exam is consistent with her previously diagnosed appendicitis.  Laboratory work-up today demonstrates a new leukocytosis to 14.7.  CMP reveals mild hypokalemia to 3.3, replaced with 10 mEq IV potassium.  Mild transaminitis that is new when compared to last encounter.  Obtained repeat CT abdomen pelvis with contrast which demonstrates continued inflammation of appendix with enlargement of the periappendiceal phlegmon concerning for abscess.  There is also increased pelvic fluid with peritoneal enhancement concerning for infected ascites.  Given enlarged abscess, perhaps now amenable to drainage.  Will consult surgery who is managing patient.  On reexamination, patient is not in any significant pain or discomfort.  She simply feels fatigued.  She still endorses appetite, but she states that she is only been having saltines and ginger ale for the past few days since her discharge.  Perhaps this is why she is mildly hypokalemic as she has been denying significant loose stools or vomiting.    Spoke with Dr. Georgette Dover who will evaluate patient for incision and drainage.  Ordered 2-hr PCR COVID-19 testing.   Final Clinical Impression(s) / ED Diagnoses Final diagnoses:   Acute perforated appendicitis    Rx / DC Orders ED Discharge Orders  None       Reita Chard 05/06/19 1117    Margette Fast, MD 05/09/19 0807    Corena Herter, PA-C 05/16/19 1100    Long, [place], MD 05/16/19 [phone]

## 2019-05-06 NOTE — Plan of Care (Signed)
  Problem: Education: Goal: Knowledge of General Education information will improve Description: Including pain rating scale, medication(s)/side effects and non-pharmacologic comfort measures Outcome: Progressing   Problem: Health Behavior/Discharge Planning: Goal: Ability to manage health-related needs will improve Outcome: Progressing   Problem: Clinical Measurements: Goal: Ability to maintain clinical measurements within normal limits will improve Outcome: Progressing Goal: Diagnostic test results will improve Outcome: Progressing Goal: Respiratory complications will improve Outcome: Progressing Goal: Cardiovascular complication will be avoided Outcome: Progressing   Problem: Activity: Goal: Risk for activity intolerance will decrease Outcome: Progressing   Problem: Coping: Goal: Level of anxiety will decrease Outcome: Progressing   Problem: Elimination: Goal: Will not experience complications related to bowel motility Outcome: Progressing Goal: Will not experience complications related to urinary retention Outcome: Progressing   Problem: Pain Managment: Goal: General experience of comfort will improve Outcome: Progressing   Problem: Safety: Goal: Ability to remain free from injury will improve Outcome: Progressing   Problem: Skin Integrity: Goal: Risk for impaired skin integrity will decrease Outcome: Progressing

## 2019-05-06 NOTE — H&P (Signed)
Evelyn Goodwin is an 24 y.o. female.   Chief Complaint: Worsening RLQ pain HPI: This is a 24 year old female who was admitted on 04/30/19 with perforated appendicitis with a small abscess too small to drain.  She was managed non-operatively on abx and bowel rest.  She improved and was discharged on 05/03/19 with PO Cipro/Flagyl.  She feels that her pain has worsened and she is having more nausea/ vomiting.  Afebrile.  Repeat CT scan shows slight enlargement in the abscess.  Past Medical History:  Diagnosis Date  . Anxiety   . Depression   . Psoriasis   . Scoliosis     Past Surgical History:  Procedure Laterality Date  . WISDOM TOOTH EXTRACTION  2014    Family History  Problem Relation Age of Onset  . Psoriasis Mother   . Healthy Brother    Social History:  reports that she has never smoked. She has never used smokeless tobacco. She reports current alcohol use. She reports previous drug use.  Allergies:  Allergies  Allergen Reactions  . Penicillin G Rash    Prior to Admission medications   Medication Sig Start Date End Date Taking? Authorizing Provider  acetaminophen (TYLENOL) 500 MG tablet Take 2 tablets (1,000 mg total) by mouth every 6 (six) hours as needed. Patient taking differently: Take 1,000 mg by mouth every 6 (six) hours as needed for mild pain or headache.  05/03/19  Yes Saverio Danker, PA-C  amphetamine-dextroamphetamine (ADDERALL XR) 20 MG 24 hr capsule Take 20 mg by mouth as needed (work days).  10/13/18  Yes [provider]  ciprofloxacin (CIPRO) 500 MG tablet Take 1 tablet (500 mg total) by mouth 2 (two) times daily for 12 days. 05/03/19 05/15/19 Yes Saverio Danker, PA-C  clotrimazole-betamethasone (LOTRISONE) cream Apply 1 application topically 2 (two) times daily as needed (rash).  10/09/18  Yes [provider]  cyclobenzaprine (FLEXERIL) 5 MG tablet Take 5 mg by mouth at bedtime as needed for muscle spasms.  04/20/19  Yes [provider]   dicyclomine (BENTYL) 20 MG tablet Take 20 mg by mouth 4 (four) times daily as needed for spasms.  04/20/19  Yes [provider]  DULoxetine (CYMBALTA) 20 MG capsule Take 20 mg by mouth 2 (two) times daily. 04/18/19  Yes [provider]  ibuprofen (ADVIL) 600 MG tablet Take 1 tablet (600 mg total) by mouth every 6 (six) hours as needed for headache, mild pain or moderate pain. 05/03/19  Yes Saverio Danker, PA-C  metroNIDAZOLE (FLAGYL) 500 MG tablet Take 1 tablet (500 mg total) by mouth every 8 (eight) hours for 12 days. 05/03/19 05/15/19 Yes Saverio Danker, PA-C  norethindrone-ethinyl estradiol (JUNEL 1/20) 1-20 MG-MCG tablet Take 1 tablet by mouth at bedtime.  08/17/18  Yes [provider]  ondansetron (ZOFRAN-ODT) 4 MG disintegrating tablet Take 1 tablet (4 mg total) by mouth every 6 (six) hours as needed for nausea. 05/03/19  Yes Saverio Danker, PA-C  oxyCODONE (OXY IR/ROXICODONE) 5 MG immediate release tablet Take 1 tablet (5 mg total) by mouth every 6 (six) hours as needed for moderate pain. 05/03/19  Yes Saverio Danker, PA-C  Secukinumab, 300 MG Dose, (COSENTYX, 300 MG DOSE,) 150 MG/ML SOSY Inject 300 mg into the skin every 30 (thirty) days.   Yes [provider]  metoprolol succinate (TOPROL XL) 25 MG 24 hr tablet Take 1 tablet (25 mg total) by mouth at bedtime. 03/12/19   Lelon Perla, MD  Results for orders placed or performed during the hospital encounter of 05/05/19 (from the past 48 hour(s))  Urinalysis, Routine w reflex microscopic     Status: Abnormal   Collection Time: 05/05/19  9:49 PM  Result Value Ref Range   Color, Urine AMBER (A) YELLOW    Comment: BIOCHEMICALS MAY BE AFFECTED BY COLOR   APPearance HAZY (A) CLEAR   Specific Gravity, Urine 1.024 1.005 - 1.030   pH 6.0 5.0 - 8.0   Glucose, UA NEGATIVE NEGATIVE mg/dL   Hgb urine dipstick NEGATIVE NEGATIVE   Bilirubin Urine NEGATIVE NEGATIVE   Ketones, ur 80 (A) NEGATIVE mg/dL   Protein, ur  100 (A) NEGATIVE mg/dL   Nitrite NEGATIVE NEGATIVE   Leukocytes,Ua SMALL (A) NEGATIVE   WBC, UA 6-10 0 - 5 WBC/hpf   Bacteria, UA NONE SEEN NONE SEEN   Squamous Epithelial / LPF 6-10 0 - 5   Mucus PRESENT    Hyaline Casts, UA PRESENT    Ca Oxalate Crys, UA PRESENT    Non Squamous Epithelial 0-5 (A) NONE SEEN    Comment: Performed at [facility], [address] [place], [place] [address]  Lipase, blood     Status: None   Collection Time: 05/05/19  9:51 PM  Result Value Ref Range   Lipase 31 11 - 51 U/L    Comment: Performed at [facility], [place] [address] [place], [place] [address]  Comprehensive metabolic panel     Status: Abnormal   Collection Time: 05/05/19  9:51 PM  Result Value Ref Range   Sodium 133 (L) 135 - 145 mmol/L   Potassium 3.3 (L) 3.5 - 5.1 mmol/L   Chloride 101 98 - 111 mmol/L   CO2 23 22 - 32 mmol/L   Glucose, Bld 106 (H) 70 - 99 mg/dL    Comment: Glucose reference range applies only to samples taken after fasting for at least 8 hours.   BUN 8 6 - 20 mg/dL   Creatinine, Ser 0.72 0.44 - 1.00 mg/dL   Calcium 9.2 8.9 - 10.3 mg/dL   Total Protein 8.0 6.5 - 8.1 g/dL   Albumin 3.8 3.5 - 5.0 g/dL   AST 45 (H) 15 - 41 U/L   ALT 56 (H) 0 - 44 U/L   Alkaline Phosphatase 63 38 - 126 U/L   Total Bilirubin 0.5 0.3 - 1.2 mg/dL   GFR calc non Af Amer >60 >60 mL/min   GFR calc Af Amer >60 >60 mL/min   Anion gap 9 5 - 15    Comment: Performed at [facility], [place] [address] [place], [place] [address]  CBC     Status: Abnormal   Collection Time: 05/05/19  9:51 PM  Result Value Ref Range   WBC 14.7 (H) 4.0 - 10.5 K/uL   RBC 4.83 3.87 - 5.11 MIL/uL   Hemoglobin 13.4 12.0 - 15.0 g/dL   HCT 41.8 36.0 - 46.0 %   MCV 86.5 80.0 - 100.0 fL   MCH 27.7 26.0 - 34.0 pg   MCHC 32.1 30.0 - 36.0 g/dL   RDW 14.7 11.5 - 15.5 %   Platelets 513 (H) 150 - 400 K/uL   nRBC 0.0 0.0 - 0.2 %    Comment: Performed at [facility], [place] [address], [place],  [place] [address]  I-Stat beta hCG blood, ED     Status: None   Collection Time: 05/05/19 10:33 PM  Result Value  Ref Range   I-stat hCG, quantitative <5.0 <5 mIU/mL   Comment 3            Comment:   GEST. AGE      CONC.  (mIU/mL)   <=1 WEEK        5 - 50     2 WEEKS       50 - 500     3 WEEKS       100 - 10,000     4 WEEKS     1,000 - 30,000        FEMALE AND NON-PREGNANT FEMALE:     LESS THAN 5 mIU/mL    CT ABDOMEN PELVIS W CONTRAST  Result Date: 05/06/2019 CLINICAL DATA:  Known appendicitis. Emesis. Progressive abdominal pain. EXAM: CT ABDOMEN AND PELVIS WITH CONTRAST TECHNIQUE: Multidetector CT imaging of the abdomen and pelvis was performed using the standard protocol following bolus administration of intravenous contrast. CONTRAST:  165m OMNIPAQUE IOHEXOL 300 MG/ML  SOLN COMPARISON:  04/30/2019 FINDINGS: Lower chest: Clear lung bases. Normal heart size without pericardial or pleural effusion. Hepatobiliary: Focal steatosis adjacent the falciform ligament. Small gallstones versus vicarious excretion of contrast by the gallbladder on 33/3. Absence on the prior favors vicarious excretion. No acute cholecystitis or biliary duct dilatation. Pancreas: Normal, without mass or ductal dilatation. Spleen: Normal in size, without focal abnormality. Adrenals/Urinary Tract: Normal adrenal glands. Too small to characterize left renal lesion. Normal right kidney, without hydronephrosis. Normal urinary bladder. Stomach/Bowel: Normal stomach, without wall thickening. Normal colon and terminal ileum. The inflamed appendix is again identified, including on image 60 through 65. There is again a complex collection surrounding the appendiceal tip, including at 2.5 x 3.0 cm on 72/3. Compare 2.4 x 2.2 cm on the prior exam (when remeasured). Normal small bowel caliber. Vascular/Lymphatic: Normal caliber of the aorta and branch vessels. No abdominopelvic adenopathy. Reproductive: Normal uterus and adnexa. Other: Increase in  small volume cul-de-sac fluid with subtle peritoneal thickening, including on 72/3. Musculoskeletal: No acute osseous abnormality. Convex right lumbar spine curvature. IMPRESSION: Redemonstration of appendicitis with enlargement of periappendiceal phlegmon. Increase in pelvic fluid with peritoneal enhancement, likely due to infected ascites. Electronically Signed   By: KAbigail MiyamotoM.D.   On: 05/06/2019 10:19    Review of Systems  Constitutional: Positive for appetite change.  HENT: Negative for ear discharge, ear pain, hearing loss and tinnitus.   Eyes: Negative for photophobia and pain.  Respiratory: Negative for cough and shortness of breath.   Cardiovascular: Negative for chest pain.  Gastrointestinal: Positive for abdominal pain, nausea and vomiting.  Genitourinary: Negative for dysuria, flank pain, frequency and urgency.  Musculoskeletal: Negative for back pain, myalgias and neck pain.  Neurological: Negative for dizziness and headaches.  Hematological: Does not bruise/bleed easily.  Psychiatric/Behavioral: The patient is nervous/anxious.     Blood pressure 115/79, pulse 93, temperature 99.1 F (37.3 C), temperature source Oral, resp. rate 16, height 5' 7"  (1.702 m), weight 52.2 kg, SpO2 99 %. Physical Exam  Constitutional:  WDWN in NAD, conversant, no obvious deformities; lying in bed comfortably Eyes:  Pupils equal, round; sclera anicteric; moist conjunctiva; no lid lag HENT:  Oral mucosa moist; good dentition  Neck:  No masses palpated, trachea midline; no thyromegaly Lungs:  CTA bilaterally; normal respiratory effort CV:  Regular rate and rhythm; no murmurs; extremities well-perfused with no edema Abd:  +bowel sounds, soft, tender in RLQ without guarding no palpable organomegaly; no palpable hernias Musc:  Unable  to assess gait; no apparent clubbing or cyanosis in extremities Lymphatic:  No palpable cervical or axillary lymphadenopathy Skin:  Warm, dry; no sign of  jaundice Psychiatric - alert and oriented x 4; calm mood and affect  Assessment/Plan Perforated appendicitis with enlarging abscess/ phlegmon Discussed with IR - not amenable to drainage  Will admit for IV antibiotics, NPO Possible surgery tomorrow - may require ileocecectomy since not improving on antibiotics and not amenable to drainage.  Dr. Grandville Silos will make final decision tomorrow.     Maia Petties, MD 05/06/2019, 11:48 AM

## 2019-05-06 NOTE — ED Notes (Signed)
Pt tolerated covid swab without gross difficulty.   Attempt to call report x 1

## 2019-05-06 NOTE — Progress Notes (Signed)
IR requested by Saverio Danker, PA-C for possible image-guided intraabdominal fluid collection aspiration.  CT abdomen/pelvis from today reviewed by Dr. Earleen Newport who states that 1- periappendiceal fluid collection is a phlegmon that is not amendable to percutaneous drainage at this time; and 2- pelvis fluid collection likely reactive ascites in pelvis and too early to drain, recommend continued conservative management. No plans for IR interventions at this time. Will delete order. Saverio Danker, PA-C made aware.  IR available in future if needed.   Evelyn Graff Shiza Thelen, PA-C 05/06/2019, 11:35 AM

## 2019-05-07 ENCOUNTER — Encounter (HOSPITAL_COMMUNITY): Admission: EM | Disposition: A | Discharge status: Home / Self Care | Payer: Self-pay | Source: Home / Self Care

## 2019-05-07 ENCOUNTER — Inpatient Hospital Stay (HOSPITAL_COMMUNITY): Payer: BC Managed Care – PPO | Admitting: Registered Nurse

## 2019-05-07 HISTORY — PX: LAPAROSCOPIC APPENDECTOMY: SHX408

## 2019-05-07 LAB — CBC
HCT: 34.8 % — ABNORMAL LOW (ref 36.0–46.0)
Hemoglobin: 11.2 g/dL — ABNORMAL LOW (ref 12.0–15.0)
MCH: 27.4 pg (ref 26.0–34.0)
MCHC: 32.2 g/dL (ref 30.0–36.0)
MCV: 85.1 fL (ref 80.0–100.0)
Platelets: 405 10*3/uL — ABNORMAL HIGH (ref 150–400)
RBC: 4.09 MIL/uL (ref 3.87–5.11)
RDW: 14.7 % (ref 11.5–15.5)
WBC: 8.9 10*3/uL (ref 4.0–10.5)
nRBC: 0 % (ref 0.0–0.2)

## 2019-05-07 LAB — BASIC METABOLIC PANEL
Anion gap: 10 (ref 5–15)
BUN: <5 mg/dL — ABNORMAL LOW (ref 6–20)
CO2: 21 mmol/L — ABNORMAL LOW (ref 22–32)
Calcium: 8.8 mg/dL — ABNORMAL LOW (ref 8.9–10.3)
Chloride: 106 mmol/L (ref 98–111)
Creatinine, Ser: 0.59 mg/dL (ref 0.44–1.00)
GFR calc Af Amer: >60 mL/min (ref >60)
GFR calc non Af Amer: >60 mL/min (ref >60)
Glucose, Bld: 107 mg/dL — ABNORMAL HIGH (ref 70–99)
Potassium: 3.8 mmol/L (ref 3.5–5.1)
Sodium: 137 mmol/L (ref 135–145)

## 2019-05-07 SURGERY — APPENDECTOMY, LAPAROSCOPIC
Anesthesia: General | Site: Abdomen

## 2019-05-07 MED ORDER — BUPIVACAINE HCL (PF) 0.25 % IJ SOLN
INTRAMUSCULAR | Status: AC
  Filled 2019-05-07: qty 30

## 2019-05-07 MED ORDER — 0.9 % SODIUM CHLORIDE (POUR BTL) OPTIME
TOPICAL | Status: DC | PRN
  Administered 2019-05-07: 1000 mL

## 2019-05-07 MED ORDER — ONDANSETRON HCL 4 MG/2ML IJ SOLN
INTRAMUSCULAR | Status: AC
  Filled 2019-05-07: qty 2

## 2019-05-07 MED ORDER — DEXMEDETOMIDINE HCL 200 MCG/2ML IV SOLN
INTRAVENOUS | Status: DC | PRN
  Administered 2019-05-07: 8 ug via INTRAVENOUS

## 2019-05-07 MED ORDER — FENTANYL CITRATE (PF) 250 MCG/5ML IJ SOLN
INTRAMUSCULAR | Status: AC
  Filled 2019-05-07: qty 5

## 2019-05-07 MED ORDER — METHOCARBAMOL 500 MG PO TABS
500.0000 mg | ORAL_TABLET | Freq: Four times a day (QID) | ORAL | Status: DC | PRN
  Administered 2019-05-07: 21:00:00 500 mg via ORAL
  Filled 2019-05-07: qty 1

## 2019-05-07 MED ORDER — FENTANYL CITRATE (PF) 250 MCG/5ML IJ SOLN
INTRAMUSCULAR | Status: DC | PRN
  Administered 2019-05-07 (×3): 50 ug via INTRAVENOUS

## 2019-05-07 MED ORDER — FENTANYL CITRATE (PF) 100 MCG/2ML IJ SOLN
25.0000 ug | INTRAMUSCULAR | Status: DC | PRN
  Administered 2019-05-07: 50 ug via INTRAVENOUS

## 2019-05-07 MED ORDER — ONDANSETRON HCL 4 MG/2ML IJ SOLN: INTRAMUSCULAR | Status: DC | PRN

## 2019-05-07 MED ORDER — FENTANYL CITRATE (PF) 100 MCG/2ML IJ SOLN
INTRAMUSCULAR | Status: AC
  Filled 2019-05-07: qty 2

## 2019-05-07 MED ORDER — DEXAMETHASONE SODIUM PHOSPHATE 10 MG/ML IJ SOLN
INTRAMUSCULAR | Status: DC | PRN
  Administered 2019-05-07: 5 mg via INTRAVENOUS

## 2019-05-07 MED ORDER — MORPHINE SULFATE (PF) 2 MG/ML IV SOLN: 2.0000 mg | INTRAVENOUS | Status: DC | PRN

## 2019-05-07 MED ORDER — MIDAZOLAM HCL 5 MG/5ML IJ SOLN
INTRAMUSCULAR | Status: DC | PRN
  Administered 2019-05-07: 2 mg via INTRAVENOUS

## 2019-05-07 MED ORDER — LIDOCAINE 2% (20 MG/ML) 5 ML SYRINGE
INTRAMUSCULAR | Status: AC
  Filled 2019-05-07: qty 5

## 2019-05-07 MED ORDER — LACTATED RINGERS IV SOLN: INTRAVENOUS | Status: DC

## 2019-05-07 MED ORDER — ONDANSETRON HCL 4 MG/2ML IJ SOLN: 4.0000 mg | Freq: Once | INTRAMUSCULAR | Status: AC

## 2019-05-07 MED ORDER — ROCURONIUM BROMIDE 10 MG/ML (PF) SYRINGE
PREFILLED_SYRINGE | INTRAVENOUS | Status: DC | PRN
  Administered 2019-05-07: 40 mg via INTRAVENOUS

## 2019-05-07 MED ORDER — ONDANSETRON HCL 4 MG/2ML IJ SOLN: 4.0000 mg | Freq: Four times a day (QID) | INTRAMUSCULAR | Status: DC | PRN

## 2019-05-07 MED ORDER — SUGAMMADEX SODIUM 200 MG/2ML IV SOLN
INTRAVENOUS | Status: DC | PRN
  Administered 2019-05-07: 200 mg via INTRAVENOUS

## 2019-05-07 MED ORDER — BUPIVACAINE HCL 0.25 % IJ SOLN
INTRAMUSCULAR | Status: DC | PRN
  Administered 2019-05-07: 20 mL

## 2019-05-07 MED ORDER — SODIUM CHLORIDE 0.9 % IR SOLN
Status: DC | PRN
  Administered 2019-05-07: 1

## 2019-05-07 MED ORDER — DEXAMETHASONE SODIUM PHOSPHATE 10 MG/ML IJ SOLN
INTRAMUSCULAR | Status: AC
  Filled 2019-05-07: qty 1

## 2019-05-07 MED ORDER — ROCURONIUM BROMIDE 10 MG/ML (PF) SYRINGE
PREFILLED_SYRINGE | INTRAVENOUS | Status: AC
  Filled 2019-05-07: qty 10

## 2019-05-07 MED ORDER — OXYCODONE HCL 5 MG PO TABS: 5.0000 mg | ORAL_TABLET | Freq: Once | ORAL | Status: DC | PRN

## 2019-05-07 MED ORDER — PROPOFOL 10 MG/ML IV BOLUS
INTRAVENOUS | Status: DC | PRN
  Administered 2019-05-07: 130 mg via INTRAVENOUS

## 2019-05-07 MED ORDER — ONDANSETRON HCL 4 MG/2ML IJ SOLN
INTRAMUSCULAR | Status: DC | PRN
  Administered 2019-05-07: 4 mg via INTRAVENOUS

## 2019-05-07 MED ORDER — ONDANSETRON HCL 4 MG/2ML IJ SOLN
INTRAMUSCULAR | Status: AC
  Administered 2019-05-07: 4 mg via INTRAVENOUS
  Filled 2019-05-07: qty 2

## 2019-05-07 MED ORDER — DIPHENHYDRAMINE HCL 50 MG/ML IJ SOLN
INTRAMUSCULAR | Status: AC
  Filled 2019-05-07: qty 1

## 2019-05-07 MED ORDER — DIPHENHYDRAMINE HCL 50 MG/ML IJ SOLN
INTRAMUSCULAR | Status: DC | PRN
  Administered 2019-05-07: 12.5 mg via INTRAVENOUS

## 2019-05-07 MED ORDER — MIDAZOLAM HCL 2 MG/2ML IJ SOLN
INTRAMUSCULAR | Status: AC
  Filled 2019-05-07: qty 2

## 2019-05-07 MED ORDER — LIDOCAINE 2% (20 MG/ML) 5 ML SYRINGE
INTRAMUSCULAR | Status: DC | PRN
  Administered 2019-05-07: 60 mg via INTRAVENOUS

## 2019-05-07 MED ORDER — OXYCODONE HCL 5 MG/5ML PO SOLN: 5.0000 mg | Freq: Once | ORAL | Status: DC | PRN

## 2019-05-07 SURGICAL SUPPLY — 43 items
APPLIER CLIP ROT 10 11.4 M/L (STAPLE)
BLADE CLIPPER SURG (BLADE) IMPLANT
CANISTER SUCT 3000ML PPV (MISCELLANEOUS) ×3 IMPLANT
CHLORAPREP W/TINT 26 (MISCELLANEOUS) ×3 IMPLANT
CLIP APPLIE ROT 10 11.4 M/L (STAPLE) IMPLANT
COVER SURGICAL LIGHT HANDLE (MISCELLANEOUS) ×3 IMPLANT
COVER WAND RF STERILE (DRAPES) ×3 IMPLANT
CUTTER FLEX LINEAR 45M (STAPLE) ×3 IMPLANT
DERMABOND ADVANCED (GAUZE/BANDAGES/DRESSINGS) ×2
DERMABOND ADVANCED .7 DNX12 (GAUZE/BANDAGES/DRESSINGS) ×1 IMPLANT
ELECT REM PT RETURN 9FT ADLT (ELECTROSURGICAL) ×3
ELECTRODE REM PT RTRN 9FT ADLT (ELECTROSURGICAL) ×1 IMPLANT
GLOVE BIO SURGEON STRL SZ8 (GLOVE) ×3 IMPLANT
GLOVE BIOGEL PI IND STRL 8 (GLOVE) ×1 IMPLANT
GLOVE BIOGEL PI INDICATOR 8 (GLOVE) ×2
GOWN STRL REUS W/ TWL LRG LVL3 (GOWN DISPOSABLE) ×2 IMPLANT
GOWN STRL REUS W/ TWL XL LVL3 (GOWN DISPOSABLE) ×1 IMPLANT
GOWN STRL REUS W/TWL LRG LVL3 (GOWN DISPOSABLE) ×4
GOWN STRL REUS W/TWL XL LVL3 (GOWN DISPOSABLE) ×2
KIT BASIN OR (CUSTOM PROCEDURE TRAY) ×3 IMPLANT
KIT TURNOVER KIT B (KITS) ×3 IMPLANT
NEEDLE 22X1 1/2 (OR ONLY) (NEEDLE) ×3 IMPLANT
NS IRRIG 1000ML POUR BTL (IV SOLUTION) ×3 IMPLANT
PAD ARMBOARD 7.5X6 YLW CONV (MISCELLANEOUS) ×6 IMPLANT
POUCH RETRIEVAL ECOSAC 10 (ENDOMECHANICALS) ×1 IMPLANT
POUCH RETRIEVAL ECOSAC 10MM (ENDOMECHANICALS) ×2
RELOAD 45 VASCULAR/THIN (ENDOMECHANICALS) ×3 IMPLANT
RELOAD STAPLE TA45 3.5 REG BLU (ENDOMECHANICALS) IMPLANT
SCISSORS LAP 5X35 DISP (ENDOMECHANICALS) IMPLANT
SET IRRIG TUBING LAPAROSCOPIC (IRRIGATION / IRRIGATOR) ×3 IMPLANT
SET TUBE SMOKE EVAC HIGH FLOW (TUBING) ×3 IMPLANT
SHEARS HARMONIC ACE PLUS 36CM (ENDOMECHANICALS) ×3 IMPLANT
SPECIMEN JAR SMALL (MISCELLANEOUS) ×3 IMPLANT
SUT VIC AB 4-0 PS2 27 (SUTURE) ×3 IMPLANT
TOWEL GREEN STERILE (TOWEL DISPOSABLE) ×3 IMPLANT
TOWEL GREEN STERILE FF (TOWEL DISPOSABLE) ×3 IMPLANT
TRAY FOLEY W/BAG SLVR 16FR (SET/KITS/TRAYS/PACK) ×2
TRAY FOLEY W/BAG SLVR 16FR ST (SET/KITS/TRAYS/PACK) ×1 IMPLANT
TRAY LAPAROSCOPIC MC (CUSTOM PROCEDURE TRAY) ×3 IMPLANT
TROCAR XCEL 12X100 BLDLESS (ENDOMECHANICALS) ×3 IMPLANT
TROCAR XCEL BLUNT TIP 100MML (ENDOMECHANICALS) ×3 IMPLANT
TROCAR XCEL NON-BLD 5MMX100MML (ENDOMECHANICALS) ×3 IMPLANT
WATER STERILE IRR 1000ML POUR (IV SOLUTION) ×3 IMPLANT

## 2019-05-07 NOTE — Transfer of Care (Signed)
Immediate Anesthesia Transfer of Care Note  Patient: Evelyn Goodwin  Procedure(s) Performed: APPENDECTOMY LAPAROSCOPIC (N/A Abdomen)  Patient Location: PACU  Anesthesia Type:General  Level of Consciousness: awake, alert , oriented and drowsy  Airway & Oxygen Therapy: Patient Spontanous Breathing and Patient connected to nasal cannula oxygen  Post-op Assessment: Report given to RN and Post -op Vital signs reviewed and stable  Post vital signs: Reviewed and stable  Last Vitals:  Vitals Value Taken Time  BP 146/88 05/07/19 1255  Temp 36.6 C 05/07/19 1255  Pulse 63 05/07/19 1257  Resp 12 05/07/19 1257  SpO2 100 % 05/07/19 1257  Vitals shown include unvalidated device data.  Last Pain:  Vitals:   05/07/19 1255  TempSrc:   PainSc: Asleep         Complications: No apparent anesthesia complications

## 2019-05-07 NOTE — Op Note (Signed)
  05/07/2019  12:43 PM  PATIENT:  Evelyn Goodwin  24 y.o. female  PRE-OPERATIVE DIAGNOSIS:  appendicitis  POST-OPERATIVE DIAGNOSIS:  appendicitis  PROCEDURE:  Procedure(s): APPENDECTOMY LAPAROSCOPIC  SURGEON:  Surgeon(s): Georganna Skeans, MD  ASSISTANTS: none   ANESTHESIA:   local  EBL:  Total I/O In: 3154.7 [I.V.:2854.7; IV Piggyback:300] Out: 10 [Blood:10]  BLOOD ADMINISTERED:none  DRAINS: none   SPECIMEN:  Excision  DISPOSITION OF SPECIMEN:  PATHOLOGY Findings: Severe appendicitis wrapped in omentum without free perforation  Procedure in detail: Informed consent was obtained.  She is on IV antibiotics.  She was brought to the operating room and general endotracheal anesthesia was administered by the anesthesia staff.  Her abdomen was prepped and draped in sterile fashion.  We did a timeout procedure.The supraumbilical region was infiltrated with local. supraumbilical incision was made. Subcutaneous tissues were dissected down revealing the anterior fascia. This was divided sharply along the midline. Peritoneal cavity was entered under direct vision without complication. A 0 Vicryl pursestring was placed around the fascial opening. Hassan trocar was inserted into the abdomen. The abdomen was insufflated with carbon dioxide in standard fashion. Under direct vision a 12 mm left lower quadrant and a 5 mm right abdominal port were placed.  Local was used at each port site.  Laparoscopic exploration revealed severe appendicitis completely wrapped in omentum without free perforation.  The omentum was gently freed up and taken off of the appendix using the harmonic scalpel and achieving good hemostasis.  The base of the appendix was intact and it was dissected.  It was then divided with Endo GIA with a vascular load.  The mesoappendix was divided with harmonic scalpel achieving excellent hemostasis.  The packs was placed in a bag and removed from the abdomen from the left lower  quadrant port site.  I did have to enlarge the port site to remove it.  The abdomen was copiously irrigated.  Staple line was intact.  The area where the appendix had been adherent to some small bowel was also intact.  Hemostasis was ensured.  Ports were then removed under direct vision.  I placed multiple 0 Vicryl sutures to close the fascial opening of the left lower quadrant port site.  The pursestring was tied above the umbilicus to close the fascia.  All 3 wounds were irrigated and the skin of each was closed with 4-0 Vicryl followed by Dermabond.  All counts were correct.  She tolerated procedure well without apparent complication and was taken recovery in stable condition.  COUNTS:  YES  DICTATION: .Dragon Dictation  PATIENT DISPOSITION:  PACU - hemodynamically stable.   Delay start of Pharmacological VTE agent (>24hrs) due to surgical blood loss or risk of bleeding:  no  Georganna Skeans, MD, MPH, FACS Pager: [phone]  4/19/202112:43 PM

## 2019-05-07 NOTE — Anesthesia Procedure Notes (Signed)
Procedure Name: Intubation Date/Time: 05/07/2019 11:45 AM Performed by: Trinna Post., CRNA Pre-anesthesia Checklist: Patient identified, Emergency Drugs available, Suction available, Patient being monitored and Timeout performed Patient Re-evaluated:Patient Re-evaluated prior to induction Oxygen Delivery Method: Circle system utilized Preoxygenation: Pre-oxygenation with 100% oxygen Induction Type: IV induction Ventilation: Mask ventilation without difficulty Laryngoscope Size: Mac and 3 Grade View: Grade I Tube type: Oral Tube size: 7.0 mm Number of attempts: 1 Airway Equipment and Method: Stylet Placement Confirmation: ETT inserted through vocal cords under direct vision,  positive ETCO2 and breath sounds checked- equal and bilateral Secured at: 22 cm Tube secured with: Tape Dental Injury: Teeth and Oropharynx as per pre-operative assessment

## 2019-05-07 NOTE — Progress Notes (Signed)
Unable to notify Dad of patients return to room. Number not found in chart.  Rowe Pavy, RN

## 2019-05-07 NOTE — Anesthesia Preprocedure Evaluation (Addendum)
Anesthesia Evaluation  Patient identified by MRN, date of birth, ID band Patient awake    Reviewed: Allergy & Precautions, H&P , NPO status , Patient's Chart, lab work & pertinent test results  Airway Mallampati: II  TM Distance: >3 FB Neck ROM: full    Dental  (+) Teeth Intact, Dental Advisory Given   Pulmonary neg pulmonary ROS,    breath sounds clear to auscultation       Cardiovascular negative cardio ROS   Rhythm:regular Rate:Normal     Neuro/Psych PSYCHIATRIC DISORDERS Anxiety Depression    GI/Hepatic appendicitis   Endo/Other    Renal/GU      Musculoskeletal   Abdominal   Peds  Hematology   Anesthesia Other Findings   Reproductive/Obstetrics                            Anesthesia Physical Anesthesia Plan  ASA: II  Anesthesia Plan: General   Post-op Pain Management:    Induction: Intravenous  PONV Risk Score and Plan: 3 and Ondansetron, Dexamethasone, Midazolam and Treatment may vary due to age or medical condition  Airway Management Planned: Oral ETT  Additional Equipment:   Intra-op Plan:   Post-operative Plan: Extubation in OR  Informed Consent: I have reviewed the patients History and Physical, chart, labs and discussed the procedure including the risks, benefits and alternatives for the proposed anesthesia with the patient or authorized representative who has indicated his/her understanding and acceptance.       Plan Discussed with: CRNA, Anesthesiologist and Surgeon  Anesthesia Plan Comments:         Anesthesia Quick Evaluation

## 2019-05-07 NOTE — Progress Notes (Signed)
Subjective/Chief Complaint: RLQ pain   Objective: Vital signs in last 24 hours: Temp:  [97.5 F (36.4 C)-98.7 F (37.1 C)] 98.6 F (37 C) (04/19 0417) Pulse Rate:  [59-95] 59 (04/19 0417) Resp:  [16-20] 17 (04/19 0417) BP: (101-137)/(52-93) 108/59 (04/19 0417) SpO2:  [97 %-100 %] 97 % (04/19 0417)    Intake/Output from previous day: 04/18 0701 - 04/19 0700 In: 1060 [P.O.:60; IV Piggyback:1000] Out: -  Intake/Output this shift: No intake/output data recorded.  General appearance: alert and cooperative Resp: clear to auscultation bilaterally Cardio: regular rate and rhythm GI: soft, tender RLQ without peritonitis  Lab Results:  Recent Labs    05/05/19 2151 05/07/19 0345  WBC 14.7* 8.9  HGB 13.4 11.2*  HCT 41.8 34.8*  PLT 513* 405*   BMET Recent Labs    05/05/19 2151 05/07/19 0345  NA 133* 137  K 3.3* 3.8  CL 101 106  CO2 23 21*  GLUCOSE 106* 107*  BUN 8 <5*  CREATININE 0.72 0.59  CALCIUM 9.2 8.8*   PT/INR No results for input(s): LABPROT, INR in the last 72 hours. ABG No results for input(s): PHART, HCO3 in the last 72 hours.  Invalid input(s): PCO2, PO2  Studies/Results: CT ABDOMEN PELVIS W CONTRAST  Result Date: 05/06/2019 CLINICAL DATA:  Known appendicitis. Emesis. Progressive abdominal pain. EXAM: CT ABDOMEN AND PELVIS WITH CONTRAST TECHNIQUE: Multidetector CT imaging of the abdomen and pelvis was performed using the standard protocol following bolus administration of intravenous contrast. CONTRAST:  134m OMNIPAQUE IOHEXOL 300 MG/ML  SOLN COMPARISON:  04/30/2019 FINDINGS: Lower chest: Clear lung bases. Normal heart size without pericardial or pleural effusion. Hepatobiliary: Focal steatosis adjacent the falciform ligament. Small gallstones versus vicarious excretion of contrast by the gallbladder on 33/3. Absence on the prior favors vicarious excretion. No acute cholecystitis or biliary duct dilatation. Pancreas: Normal, without mass or ductal  dilatation. Spleen: Normal in size, without focal abnormality. Adrenals/Urinary Tract: Normal adrenal glands. Too small to characterize left renal lesion. Normal right kidney, without hydronephrosis. Normal urinary bladder. Stomach/Bowel: Normal stomach, without wall thickening. Normal colon and terminal ileum. The inflamed appendix is again identified, including on image 60 through 65. There is again a complex collection surrounding the appendiceal tip, including at 2.5 x 3.0 cm on 72/3. Compare 2.4 x 2.2 cm on the prior exam (when remeasured). Normal small bowel caliber. Vascular/Lymphatic: Normal caliber of the aorta and branch vessels. No abdominopelvic adenopathy. Reproductive: Normal uterus and adnexa. Other: Increase in small volume cul-de-sac fluid with subtle peritoneal thickening, including on 72/3. Musculoskeletal: No acute osseous abnormality. Convex right lumbar spine curvature. IMPRESSION: Redemonstration of appendicitis with enlargement of periappendiceal phlegmon. Increase in pelvic fluid with peritoneal enhancement, likely due to infected ascites. Electronically Signed   By: KAbigail MiyamotoM.D.   On: 05/06/2019 10:19    Anti-infectives: Anti-infectives (From admission, onward)   Start     Dose/Rate Route Frequency Ordered Stop   05/06/19 1130  cefTRIAXone (ROCEPHIN) 2 g in sodium chloride 0.9 % 100 mL IVPB     2 g 200 mL/hr over 30 Minutes Intravenous Every 24 hours 05/06/19 1115     05/06/19 1130  metroNIDAZOLE (FLAGYL) IVPB 500 mg     500 mg 100 mL/hr over 60 Minutes Intravenous Every 8 hours 05/06/19 1115        Assessment/Plan: Perforated appendicitis with abscess - IR cannot drain, has failed non-operative management. Rocephin and flagyl IV. To OR today for laparoscopic appendectomy, possible ileocecectomy. I discussed  the procedure, risks, and benefits with her and we also called her father. She agrees.  LOS: 1 day    Zenovia Jarred 05/07/2019

## 2019-05-07 NOTE — Anesthesia Postprocedure Evaluation (Signed)
Anesthesia Post Note  Patient: Evelyn Goodwin  Procedure(s) Performed: APPENDECTOMY LAPAROSCOPIC (N/A Abdomen)     Patient location during evaluation: PACU Anesthesia Type: General Level of consciousness: awake and alert Pain management: pain level controlled Vital Signs Assessment: post-procedure vital signs reviewed and stable Respiratory status: spontaneous breathing, nonlabored ventilation and respiratory function stable Cardiovascular status: blood pressure returned to baseline and stable Postop Assessment: no apparent nausea or vomiting Anesthetic complications: no    Last Vitals:  Vitals:   05/07/19 1325 05/07/19 1358  BP: 124/90 124/86  Pulse: (!) 58 60  Resp: 16 16  Temp: 36.6 C 36.7 C  SpO2: 99% 100%    Last Pain:  Vitals:   05/07/19 1647  TempSrc:   PainSc: 6                  Adea Geisel,W. EDMOND

## 2019-05-08 ENCOUNTER — Encounter (HOSPITAL_COMMUNITY): Payer: Self-pay

## 2019-05-08 LAB — SURGICAL PATHOLOGY

## 2019-05-08 MED ORDER — OXYCODONE HCL 5 MG PO TABS: 5.0000 mg | ORAL_TABLET | Freq: Four times a day (QID) | ORAL | 0 refills | Status: DC | PRN

## 2019-05-08 MED ORDER — METHOCARBAMOL 500 MG PO TABS: 500.0000 mg | ORAL_TABLET | Freq: Four times a day (QID) | ORAL | 0 refills | Status: DC | PRN

## 2019-05-08 NOTE — Discharge Instructions (Signed)
[facility]  Please arrive at least 30 min before your appointment to complete your check in paperwork.  If you are unable to arrive 30 min prior to your appointment time we may have to cancel or reschedule you. LAPAROSCOPIC SURGERY: POST OP INSTRUCTIONS Always review your discharge instruction sheet given to you by the facility where your surgery was performed. IF YOU HAVE DISABILITY OR FAMILY LEAVE FORMS, YOU MUST BRING THEM TO THE OFFICE FOR PROCESSING.   DO NOT GIVE THEM TO YOUR DOCTOR.  PAIN CONTROL  1. First take acetaminophen (Tylenol) AND/or ibuprofen (Advil) to control your pain after surgery.  Follow directions on package.  Taking acetaminophen (Tylenol) and/or ibuprofen (Advil) regularly after surgery will help to control your pain and lower the amount of prescription pain medication you may need.  You should not take more than 4,000 mg (4 grams) of acetaminophen (Tylenol) in 24 hours.  You should not take ibuprofen (Advil), aleve, motrin, naprosyn or other NSAIDS if you have a history of stomach ulcers or chronic kidney disease.  2. A prescription for pain medication may be given to you upon discharge.  Take your pain medication as prescribed, if you still have uncontrolled pain after taking acetaminophen (Tylenol) or ibuprofen (Advil). 3. Use ice packs to help control pain. 4. If you need a refill on your pain medication, please contact your pharmacy.  They will contact our office to request authorization. Prescriptions will not be filled after 5pm or on week-ends.  HOME MEDICATIONS 5. Take your usually prescribed medications unless otherwise directed.  DIET 6. You should follow a light diet the first few days after arrival home.  Be sure to include lots of fluids daily. Avoid fatty, fried foods.   CONSTIPATION 7. It is common to experience some constipation after surgery and if you are taking pain medication.  Increasing fluid intake and taking a stool  softener (such as Colace) will usually help or prevent this problem from occurring.  A mild laxative (Milk of Magnesia or Miralax) should be taken according to package instructions if there are no bowel movements after 48 hours.  WOUND/INCISION CARE 8. Most patients will experience some swelling and bruising in the area of the incisions.  Ice packs will help.  Swelling and bruising can take several days to resolve.  9. Unless discharge instructions indicate otherwise, follow guidelines below  a. STERI-STRIPS - you may remove your outer bandages 48 hours after surgery, and you may shower at that time.  You have steri-strips (small skin tapes) in place directly over the incision.  These strips should be left on the skin for 7-10 days.   b. DERMABOND/SKIN GLUE - you may shower in 24 hours.  The glue will flake off over the next 2-3 weeks. 10. Any sutures or staples will be removed at the office during your follow-up visit.  ACTIVITIES 11. You may resume regular (light) daily activities beginning the next day--such as daily self-care, walking, climbing stairs--gradually increasing activities as tolerated.  You may have sexual intercourse when it is comfortable.  Refrain from any heavy lifting or straining until approved by your doctor. a. You may drive when you are no longer taking prescription pain medication, you can comfortably wear a seatbelt, and you can safely maneuver your car and apply brakes.  FOLLOW-UP 12. You should see your doctor in the office for a follow-up appointment approximately 2-3 weeks after your surgery.  You should have been given your post-op/follow-up appointment when  your surgery was scheduled.  If you did not receive a post-op/follow-up appointment, make sure that you call for this appointment within a day or two after you arrive home to insure a convenient appointment time.   WHEN TO CALL YOUR DOCTOR: 1. Fever over 101.0 2. Inability to urinate 3. Continued bleeding from  incision. 4. Increased pain, redness, or drainage from the incision. 5. Increasing abdominal pain  The clinic staff is available to answer your questions during regular business hours.  Please don't hesitate to call and ask to speak to one of the nurses for clinical concerns.  If you have a medical emergency, go to the nearest emergency room or call 911.  A surgeon from [facility] is always on call at the hospital. [address] [place], [place], [place]  [address] ? [address], [place], [place]   [address] [phone] ? [phone] ? FAX [phone]  ........Marland Kitchen   Managing Your Pain After Surgery Without Opioids    Thank you for participating in our program to help patients manage their pain after surgery without opioids. This is part of our effort to provide you with the best care possible, without exposing you or your family to the risk that opioids pose.  What pain can I expect after surgery? You can expect to have some pain after surgery. This is normal. The pain is typically worse the day after surgery, and quickly begins to get better. Many studies have found that many patients are able to manage their pain after surgery with Over-the-Counter (OTC) medications such as Tylenol and Motrin. If you have a condition that does not allow you to take Tylenol or Motrin, notify your surgical team.  How will I manage my pain? The best strategy for controlling your pain after surgery is around the clock pain control with Tylenol (acetaminophen) and Motrin (ibuprofen or Advil). Alternating these medications with each other allows you to maximize your pain control. In addition to Tylenol and Motrin, you can use heating pads or ice packs on your incisions to help reduce your pain.  How will I alternate your regular strength over-the-counter pain medication? You will take a dose of pain medication every three hours. ; Start by taking 650 mg of Tylenol (2 pills of 325  mg) ; 3 hours later take 600 mg of Motrin (3 pills of 200 mg) ; 3 hours after taking the Motrin take 650 mg of Tylenol ; 3 hours after that take 600 mg of Motrin.   - 1 -  See example - if your first dose of Tylenol is at 12:00 PM   12:00 PM Tylenol 650 mg (2 pills of 325 mg)  3:00 PM Motrin 600 mg (3 pills of 200 mg)  6:00 PM Tylenol 650 mg (2 pills of 325 mg)  9:00 PM Motrin 600 mg (3 pills of 200 mg)  Continue alternating every 3 hours   We recommend that you follow this schedule around-the-clock for at least 3 days after surgery, or until you feel that it is no longer needed. Use the table on the last page of this handout to keep track of the medications you are taking. Important: Do not take more than 3027m of Tylenol or 32021mof Motrin in a 24-hour period. Do not take ibuprofen/Motrin if you have a history of bleeding stomach ulcers, severe kidney disease, &/or actively taking a blood thinner  What if I still have pain? If you have pain that is not  controlled with the over-the-counter pain medications (Tylenol and Motrin or Advil) you might have what we call "breakthrough" pain. You will receive a prescription for a small amount of an opioid pain medication such as Oxycodone, Tramadol, or Tylenol with Codeine. Use these opioid pills in the first 24 hours after surgery if you have breakthrough pain. Do not take more than 1 pill every 4-6 hours.  If you still have uncontrolled pain after using all opioid pills, don't hesitate to call our staff using the number provided. We will help make sure you are managing your pain in the best way possible, and if necessary, we can provide a prescription for additional pain medication.   Day 1    Time  Name of Medication Number of pills taken  Amount of Acetaminophen  Pain Level   Comments  AM PM       AM PM       AM PM       AM PM       AM PM       AM PM       AM PM       AM PM       Total Daily amount of Acetaminophen Do not  take more than  3,000 mg per day      Day 2    Time  Name of Medication Number of pills taken  Amount of Acetaminophen  Pain Level   Comments  AM PM       AM PM       AM PM       AM PM       AM PM       AM PM       AM PM       AM PM       Total Daily amount of Acetaminophen Do not take more than  3,000 mg per day      Day 3    Time  Name of Medication Number of pills taken  Amount of Acetaminophen  Pain Level   Comments  AM PM       AM PM       AM PM       AM PM          AM PM       AM PM       AM PM       AM PM       Total Daily amount of Acetaminophen Do not take more than  3,000 mg per day      Day 4    Time  Name of Medication Number of pills taken  Amount of Acetaminophen  Pain Level   Comments  AM PM       AM PM       AM PM       AM PM       AM PM       AM PM       AM PM       AM PM       Total Daily amount of Acetaminophen Do not take more than  3,000 mg per day      Day 5    Time  Name of Medication Number of pills taken  Amount of Acetaminophen  Pain Level   Comments  AM PM       AM PM       AM  PM       AM PM       AM PM       AM PM       AM PM       AM PM       Total Daily amount of Acetaminophen Do not take more than  3,000 mg per day       Day 6    Time  Name of Medication Number of pills taken  Amount of Acetaminophen  Pain Level  Comments  AM PM       AM PM       AM PM       AM PM       AM PM       AM PM       AM PM       AM PM       Total Daily amount of Acetaminophen Do not take more than  3,000 mg per day      Day 7    Time  Name of Medication Number of pills taken  Amount of Acetaminophen  Pain Level   Comments  AM PM       AM PM       AM PM       AM PM       AM PM       AM PM       AM PM       AM PM       Total Daily amount of Acetaminophen Do not take more than  3,000 mg per day        For additional information about how and where to safely dispose of unused  opioid medications - [employer]  Disclaimer: This document contains information and/or instructional materials adapted from [place] for the typical patient with your condition. It does not replace medical advice from your health care provider because your experience may differ from that of the typical patient. Talk to your health care provider if you have any questions about this document, your condition or your treatment plan. Adapted from [place]

## 2019-05-08 NOTE — Discharge Summary (Signed)
Patient ID: Evelyn Goodwin [id number] 1995/04/20 24 y.o.  Admit date: 05/05/2019 Discharge date: 05/08/2019  Admitting Diagnosis: Perforated appendicitis with enlarging abscess/ phlegmon  Discharge Diagnosis Patient Active Problem List   Diagnosis Date Noted  . Acute perforated appendicitis 05/06/2019  . Ruptured appendicitis 04/30/2019  . Anxiety and depression 10/11/2018  . Other idiopathic scoliosis, lumbar region 10/11/2018  . History of ADHD 10/11/2018  . 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 Psoriatic arthritis ([place]) 10/11/2018    Consultants IR   Procedures Dr. Grandville Silos - 05/07/2019 - Laparoscopic Appendectomy   Hospital Course: This is a 24 year old female who was admitted on 04/30/19 with perforated appendicitis with a small abscess too small to drain.  She was managed non-operatively on abx and bowel rest.  She improved and was discharged on 05/03/19 with PO Cipro/Flagyl.  She feels that her pain has worsened and she is having more nausea/ vomiting.  Afebrile.  Repeat CT scan shows slight enlargement in the abscess.  IR was consulted and the area was not amenable to drainage. Patient was placed on abx and taken to the OR where she underwent laparoscopic appendectomy. Patient tolerated the procedure well and was transferred back to the floor. On POD 1, the patient was voiding well, tolerating diet, ambulating well, pain well controlled, vital signs stable, incisions c/d/i and felt stable for discharge home. A note was provided for work.   Physical Exam: Gen:  Alert, NAD, pleasant HEENT: EOM's intact, pupils equal and round Card:  RRR, no M/G/R heard Pulm:  CTAB, no W/R/R, effort normal Abd: Soft, NT/ND, +BS, Incisions with glue intact appears well and are without drainage, bleeding, or signs of infection Ext:  No erythema, edema, or tenderness BUE/BLE  Psych: A&Ox3  Skin: no rashes noted, warm and dry  Allergies as of 05/08/2019      Reactions   Penicillin G Rash       Medication List    STOP taking these medications   ciprofloxacin 500 MG tablet Commonly known as: CIPRO   metroNIDAZOLE 500 MG tablet Commonly known as: FLAGYL     TAKE these medications   acetaminophen 500 MG tablet Commonly known as: TYLENOL Take 2 tablets (1,000 mg total) by mouth every 6 (six) hours as needed. What changed: reasons to take this   amphetamine-dextroamphetamine 20 MG 24 hr capsule Commonly known as: ADDERALL XR Take 20 mg by mouth as needed (work days).   clotrimazole-betamethasone cream Commonly known as: LOTRISONE Apply 1 application topically 2 (two) times daily as needed (rash).   Cosentyx (300 MG Dose) 150 MG/ML Sosy Generic drug: Secukinumab (300 MG Dose) Inject 300 mg into the skin every 30 (thirty) days.   cyclobenzaprine 5 MG tablet Commonly known as: FLEXERIL Take 5 mg by mouth at bedtime as needed for muscle spasms.   dicyclomine 20 MG tablet Commonly known as: BENTYL Take 20 mg by mouth 4 (four) times daily as needed for spasms.   DULoxetine 20 MG capsule Commonly known as: CYMBALTA Take 20 mg by mouth 2 (two) times daily.   ibuprofen 600 MG tablet Commonly known as: ADVIL Take 1 tablet (600 mg total) by mouth every 6 (six) hours as needed for headache, mild pain or moderate pain.   Junel 1/20 1-20 MG-MCG tablet Generic drug: norethindrone-ethinyl estradiol Take 1 tablet by mouth at bedtime.   methocarbamol 500 MG tablet Commonly known as: ROBAXIN Take 1 tablet (500 mg total) by mouth every 6 (six) hours as needed  for muscle spasms.   metoprolol succinate 25 MG 24 hr tablet Commonly known as: Toprol XL Take 1 tablet (25 mg total) by mouth at bedtime.   ondansetron 4 MG disintegrating tablet Commonly known as: ZOFRAN-ODT Take 1 tablet (4 mg total) by mouth every 6 (six) hours as needed for nausea.   oxyCODONE 5 MG immediate release tablet Commonly known as: Oxy IR/ROXICODONE Take 1 tablet (5 mg total) by mouth every 6  (six) hours as needed for breakthrough pain. What changed: reasons to take this        Follow-up Information    [facility]. Go on 05/22/2019.   Specialty: General Surgery Why: 05/04 at 11 am. Please arrive 30 minutes prior to your appointment for paperwork. Please bring a copy of your photo ID and insurance card.  Contact information: [address] [place] [address] [phone]           Signed: Alferd Apa, [facility] 05/08/2019, 10:25 AM Please see Amion for pager number during day hours 7:00am-4:30pm

## 2019-05-08 NOTE — Progress Notes (Signed)
Evelyn Goodwin to be D/C'd per MD order. Discussed with the patient and all questions fully answered. ? VSS, Skin clean, dry and intact without evidence of skin break down, no evidence of skin tears noted. ? IV catheter discontinued intact. Site without signs and symptoms of complications. Dressing and pressure applied. ? An After Visit Summary was printed and given to the patient. Patient informed where to pickup prescriptions. ? D/c education completed with patient/family including follow up instructions, medication list, d/c activities limitations if indicated, with other d/c instructions as indicated by MD - patient able to verbalize understanding, all questions fully answered.  ? Patient instructed to return to ED, call 911, or call MD for any changes in condition.  ? Patient to be escorted via [place], and D/C home via private auto.

## 2019-05-08 NOTE — Progress Notes (Deleted)
HPI: Follow-up palpitations. CT November 2020 showed no pulmonary embolus. Laboratories November 2020 showed potassium 3.7. Rapid urine drug screen December 2020 negative. TSH December 2020 1.464.  Echocardiogram February 2021 showed normal LV function.  Monitor March 2021 showed sinus rhythm with occasional PVC and 3 beats nonsustained ventricular tachycardia.  Since last seen   No current facility-administered medications for this visit.   No current outpatient medications on file.   Facility-Administered Medications Ordered in Other Visits  Medication Dose Route Frequency Provider Last Rate Last Admin  . acetaminophen (TYLENOL) tablet 1,000 mg  1,000 mg Oral Q6H PRN Maczis, Barth Kirks, PA-C      . cefTRIAXone (ROCEPHIN) 2 g in sodium chloride 0.9 % 100 mL IVPB  2 g Intravenous Q24H Jillyn Ledger, PA-C 200 mL/hr at 05/07/19 1609 2 g at 05/07/19 1609   And  . metroNIDAZOLE (FLAGYL) IVPB 500 mg  500 mg Intravenous Q8H Jillyn Ledger, PA-C 100 mL/hr at 05/08/19 0347 500 mg at 05/08/19 0347  . dextrose 5 % and 0.45 % NaCl with KCl 20 mEq/L infusion   Intravenous Continuous Jillyn Ledger, PA-C 125 mL/hr at 05/08/19 0622 New Bag at 05/08/19 0622  . diphenhydrAMINE (BENADRYL) capsule 25 mg  25 mg Oral Q6H PRN Jillyn Ledger, PA-C   25 mg at 05/06/19 2105   Or  . diphenhydrAMINE (BENADRYL) injection 25 mg  25 mg Intravenous Q6H PRN Jillyn Ledger, PA-C      . DULoxetine (CYMBALTA) DR capsule 20 mg  20 mg Oral BID Jillyn Ledger, PA-C   20 mg at 05/07/19 2129  . enoxaparin (LOVENOX) injection 40 mg  40 mg Subcutaneous Q24H Jillyn Ledger, PA-C   Stopped at 05/06/19 1312  . ibuprofen (ADVIL) tablet 600 mg  600 mg Oral TID PRN Jillyn Ledger, PA-C      . lactated ringers infusion   Intravenous Continuous Jillyn Ledger, PA-C   New Bag at 05/07/19 1115  . methocarbamol (ROBAXIN) tablet 500 mg  500 mg Oral Q6H PRN Jillyn Ledger, PA-C   500 mg at 05/07/19 2129  .  metoprolol tartrate (LOPRESSOR) injection 5 mg  5 mg Intravenous Q6H PRN Maczis, Barth Kirks, PA-C      . morphine 2 MG/ML injection 2 mg  2 mg Intravenous Q2H PRN Maczis, Barth Kirks, PA-C      . ondansetron (ZOFRAN-ODT) disintegrating tablet 4 mg  4 mg Oral Q6H PRN Maczis, Barth Kirks, PA-C       Or  . ondansetron [facility]) injection 4 mg  4 mg Intravenous Q6H PRN Jillyn Ledger, PA-C   4 mg at 05/07/19 1554  . oxyCODONE (Oxy IR/ROXICODONE) immediate release tablet 5-10 mg  5-10 mg Oral Q4H PRN Jillyn Ledger, PA-C   10 mg at 05/07/19 1554  . simethicone (MYLICON) chewable tablet 40 mg  40 mg Oral Q6H PRN Maczis, Barth Kirks, PA-C      . sodium chloride flush (NS) 0.9 % injection 3 mL  3 mL Intravenous Once Maczis, Barth Kirks, PA-C         Past Medical History:  Diagnosis Date  . Anxiety   . Depression   . Psoriasis   . Scoliosis     Past Surgical History:  Procedure Laterality Date  . LAPAROSCOPIC APPENDECTOMY N/A 05/07/2019   Procedure: APPENDECTOMY LAPAROSCOPIC;  Surgeon: Georganna Skeans, MD;  Location: [facility];  Service: General;  Laterality: N/A;  . WISDOM  TOOTH EXTRACTION  2014    Social History   Socioeconomic History  . Marital status: Single    Spouse name: Not on file  . Number of children: Not on file  . Years of education: Not on file  . Highest education level: Not on file  Occupational History  . Not on file  Tobacco Use  . Smoking status: Never Smoker  . Smokeless tobacco: Never Used  Substance and Sexual Activity  . Alcohol use: Yes    Comment: Rare  . Drug use: Not Currently  . Sexual activity: Not on file  Other Topics Concern  . Not on file  Social History Narrative  . Not on file   Social Determinants of Health   Financial Resource Strain:   . Difficulty of Paying Living Expenses:   Food Insecurity:   . Worried About Charity fundraiser in the Last Year:   . Arboriculturist in the Last Year:   Transportation Needs:   . Film/video editor  (Medical):   Marland Kitchen Lack of Transportation (Non-Medical):   Physical Activity:   . Days of Exercise per Week:   . Minutes of Exercise per Session:   Stress:   . Feeling of Stress :   Social Connections:   . Frequency of Communication with Friends and Family:   . Frequency of Social Gatherings with Friends and Family:   . Attends Religious Services:   . Active Member of Clubs or Organizations:   . Attends Archivist Meetings:   Marland Kitchen Marital Status:   Intimate Partner Violence:   . Fear of Current or Ex-Partner:   . Emotionally Abused:   Marland Kitchen Physically Abused:   . Sexually Abused:     Family History  Problem Relation Age of Onset  . Psoriasis Mother   . Healthy Brother     ROS: no fevers or chills, productive cough, hemoptysis, dysphasia, odynophagia, melena, hematochezia, dysuria, hematuria, rash, seizure activity, orthopnea, PND, pedal edema, claudication. Remaining systems are negative.  Physical Exam: Well-developed well-nourished in no acute distress.  Skin is warm and dry.  HEENT is normal.  Neck is supple.  Chest is clear to auscultation with normal expansion.  Cardiovascular exam is regular rate and rhythm.  Abdominal exam nontender or distended. No masses palpated. Extremities show no edema. neuro grossly intact  ECG- personally reviewed  A/P  1 palpitations-LV function is normal.  Monitor showed sinus rhythm with PVCs and 3 beats nonsustained ventricular tachycardia.  Her symptoms have improved with Toprol 25 mg daily.  We will continue.  2 psoriasis-managed by primary care.  Kirk Ruths, MD

## 2019-05-09 ENCOUNTER — Other Ambulatory Visit: Payer: BC Managed Care – PPO

## 2019-05-14 ENCOUNTER — Ambulatory Visit: Payer: BC Managed Care – PPO | Admitting: Cardiology

## 2019-05-24 ENCOUNTER — Other Ambulatory Visit: Payer: Self-pay | Admitting: Student

## 2019-05-24 DIAGNOSIS — K651 Peritoneal abscess: Secondary | ICD-10-CM

## 2019-05-25 ENCOUNTER — Other Ambulatory Visit: Payer: Self-pay

## 2019-05-25 ENCOUNTER — Ambulatory Visit (HOSPITAL_COMMUNITY)
Admission: RE | Admit: 2019-05-25 | Discharge: 2019-05-25 | Disposition: A | Discharge status: Home / Self Care | Payer: BC Managed Care – PPO | Source: Ambulatory Visit | Attending: Student | Admitting: Student

## 2019-05-25 DIAGNOSIS — K651 Peritoneal abscess: Secondary | ICD-10-CM | POA: Diagnosis not present

## 2019-05-25 MED ORDER — IOHEXOL 9 MG/ML PO SOLN
ORAL | Status: AC
  Filled 2019-05-25: qty 1000

## 2019-05-25 MED ORDER — IOHEXOL 9 MG/ML PO SOLN
1000.0000 mL | ORAL | Status: AC
  Administered 2019-05-25: 1000 mL via ORAL

## 2019-05-25 MED ORDER — IOHEXOL 300 MG/ML  SOLN
100.0000 mL | Freq: Once | INTRAMUSCULAR | Status: AC | PRN
  Administered 2019-05-25: 100 mL via INTRAVENOUS

## 2019-05-30 ENCOUNTER — Encounter (HOSPITAL_COMMUNITY): Payer: Self-pay

## 2019-05-30 ENCOUNTER — Emergency Department (HOSPITAL_COMMUNITY)
Admission: EM | Admit: 2019-05-30 | Discharge: 2019-05-31 | Disposition: A | Discharge status: Home / Self Care | Payer: BC Managed Care – PPO | Attending: Emergency Medicine | Admitting: Emergency Medicine

## 2019-05-30 DIAGNOSIS — K529 Noninfective gastroenteritis and colitis, unspecified: Secondary | ICD-10-CM | POA: Diagnosis not present

## 2019-05-30 DIAGNOSIS — Z79899 Other long term (current) drug therapy: Secondary | ICD-10-CM | POA: Insufficient documentation

## 2019-05-30 DIAGNOSIS — R102 Pelvic and perineal pain: Secondary | ICD-10-CM | POA: Insufficient documentation

## 2019-05-30 DIAGNOSIS — R1032 Left lower quadrant pain: Secondary | ICD-10-CM | POA: Diagnosis present

## 2019-05-30 LAB — COMPREHENSIVE METABOLIC PANEL
ALT: 22 U/L (ref 0–44)
AST: 18 U/L (ref 15–41)
Albumin: 3.7 g/dL (ref 3.5–5.0)
Alkaline Phosphatase: 65 U/L (ref 38–126)
Anion gap: 10 (ref 5–15)
BUN: 7 mg/dL (ref 6–20)
CO2: 22 mmol/L (ref 22–32)
Calcium: 9.1 mg/dL (ref 8.9–10.3)
Chloride: 108 mmol/L (ref 98–111)
Creatinine, Ser: 0.83 mg/dL (ref 0.44–1.00)
GFR calc Af Amer: >60 mL/min (ref >60)
GFR calc non Af Amer: >60 mL/min (ref >60)
Glucose, Bld: 92 mg/dL (ref 70–99)
Potassium: 3.2 mmol/L — ABNORMAL LOW (ref 3.5–5.1)
Sodium: 140 mmol/L (ref 135–145)
Total Bilirubin: 0.1 mg/dL — ABNORMAL LOW (ref 0.3–1.2)
Total Protein: 7.7 g/dL (ref 6.5–8.1)

## 2019-05-30 LAB — URINALYSIS, ROUTINE W REFLEX MICROSCOPIC
Bilirubin Urine: NEGATIVE
Glucose, UA: NEGATIVE mg/dL
Hgb urine dipstick: NEGATIVE
Ketones, ur: NEGATIVE mg/dL
Nitrite: NEGATIVE
Protein, ur: 30 mg/dL — AB
Specific Gravity, Urine: 1.026 (ref 1.005–1.030)
pH: 5 (ref 5.0–8.0)

## 2019-05-30 LAB — I-STAT BETA HCG BLOOD, ED (MC, WL, AP ONLY): I-stat hCG, quantitative: <5 m[IU]/mL (ref <5)

## 2019-05-30 LAB — CBC
HCT: 40.6 % (ref 36.0–46.0)
Hemoglobin: 12.8 g/dL (ref 12.0–15.0)
MCH: 27.1 pg (ref 26.0–34.0)
MCHC: 31.5 g/dL (ref 30.0–36.0)
MCV: 86 fL (ref 80.0–100.0)
Platelets: 403 10*3/uL — ABNORMAL HIGH (ref 150–400)
RBC: 4.72 MIL/uL (ref 3.87–5.11)
RDW: 15 % (ref 11.5–15.5)
WBC: 9.6 10*3/uL (ref 4.0–10.5)
nRBC: 0 % (ref 0.0–0.2)

## 2019-05-30 LAB — LIPASE, BLOOD: Lipase: 51 U/L (ref 11–51)

## 2019-05-30 MED ORDER — SODIUM CHLORIDE 0.9% FLUSH
3.0000 mL | Freq: Once | INTRAVENOUS | Status: AC
  Administered 2019-05-31: 3 mL via INTRAVENOUS

## 2019-05-30 NOTE — ED Triage Notes (Signed)
Pt reports that she has a appendectomy 3 weeks ago and for the past two weeks she has had colitis and diarrhea, nausea, unable to eat and drink. Tested for cdiff and it was negative.

## 2019-05-31 ENCOUNTER — Other Ambulatory Visit: Payer: Self-pay

## 2019-05-31 ENCOUNTER — Emergency Department (HOSPITAL_COMMUNITY): Payer: BC Managed Care – PPO

## 2019-05-31 ENCOUNTER — Encounter: Payer: Self-pay | Admitting: Nurse Practitioner

## 2019-05-31 MED ORDER — ONDANSETRON 4 MG PO TBDP: 4.0000 mg | ORAL_TABLET | Freq: Four times a day (QID) | ORAL | 0 refills | Status: DC | PRN

## 2019-05-31 MED ORDER — FENTANYL CITRATE (PF) 100 MCG/2ML IJ SOLN
50.0000 ug | Freq: Once | INTRAMUSCULAR | Status: AC
  Administered 2019-05-31: 50 ug via INTRAVENOUS
  Filled 2019-05-31: qty 2

## 2019-05-31 MED ORDER — METRONIDAZOLE 500 MG PO TABS
500.0000 mg | ORAL_TABLET | Freq: Three times a day (TID) | ORAL | 0 refills | Status: DC
Start: 2019-05-31 — End: 2019-06-13

## 2019-05-31 MED ORDER — OXYCODONE-ACETAMINOPHEN 5-325 MG PO TABS: 2.0000 | ORAL_TABLET | Freq: Four times a day (QID) | ORAL | 0 refills | Status: DC | PRN

## 2019-05-31 MED ORDER — CIPROFLOXACIN HCL 500 MG PO TABS
500.0000 mg | ORAL_TABLET | Freq: Two times a day (BID) | ORAL | 0 refills | Status: DC
Start: 2019-05-31 — End: 2019-06-13

## 2019-05-31 MED ORDER — ONDANSETRON HCL 4 MG/2ML IJ SOLN
4.0000 mg | Freq: Once | INTRAMUSCULAR | Status: AC
  Administered 2019-05-31: 4 mg via INTRAVENOUS
  Filled 2019-05-31: qty 2

## 2019-05-31 MED ORDER — DICYCLOMINE HCL 20 MG PO TABS
20.0000 mg | ORAL_TABLET | Freq: Three times a day (TID) | ORAL | 0 refills | Status: DC | PRN
Start: 2019-05-31 — End: 2019-07-17

## 2019-05-31 MED ORDER — KETOROLAC TROMETHAMINE 30 MG/ML IJ SOLN
30.0000 mg | Freq: Once | INTRAMUSCULAR | Status: AC
  Administered 2019-05-31: 30 mg via INTRAVENOUS
  Filled 2019-05-31: qty 1

## 2019-05-31 MED ORDER — DICYCLOMINE HCL 10 MG/ML IM SOLN
20.0000 mg | Freq: Once | INTRAMUSCULAR | Status: AC
  Administered 2019-05-31: 20 mg via INTRAMUSCULAR
  Filled 2019-05-31: qty 2

## 2019-05-31 MED ORDER — METRONIDAZOLE 500 MG PO TABS
500.0000 mg | ORAL_TABLET | Freq: Once | ORAL | Status: AC
  Administered 2019-05-31: 500 mg via ORAL
  Filled 2019-05-31: qty 1

## 2019-05-31 MED ORDER — PROBIOTIC PO CAPS: 1.0000 | ORAL_CAPSULE | Freq: Every day | ORAL | 1 refills | Status: DC

## 2019-05-31 MED ORDER — CIPROFLOXACIN HCL 500 MG PO TABS
500.0000 mg | ORAL_TABLET | Freq: Once | ORAL | Status: AC
  Administered 2019-05-31: 500 mg via ORAL
  Filled 2019-05-31: qty 1

## 2019-05-31 MED ORDER — SODIUM CHLORIDE 0.9 % IV BOLUS (SEPSIS)
1000.0000 mL | Freq: Once | INTRAVENOUS | Status: AC
  Administered 2019-05-31: 1000 mL via INTRAVENOUS

## 2019-05-31 NOTE — ED Notes (Signed)
Pt transported to [facility]

## 2019-05-31 NOTE — Discharge Instructions (Signed)
Your labs and urine are reassuring today.  We have sent stool studies to evaluate for other GI pathogens that could be causing your colitis and diarrhea.  We have talked to the general surgeon on-call who recommends that we start you back on Cipro and Flagyl.  You may use over-the-counter Imodium as needed for diarrhea.  We have refilled your Percocet and Zofran and also prescribed Bentyl which can help with abdominal cramps, spasms as well as a probiotic.  I do recommend close follow-up with a gastroenterologist as you may need a colonoscopy if your symptoms do not improve.  I have placed a referral through the computer to the [facility] gastroenterology group.

## 2019-05-31 NOTE — ED Provider Notes (Signed)
TIME SEEN: 12:49 AM  CHIEF COMPLAINT: Left lower quadrant abdominal pain, diarrhea  HPI: Patient is a 24 year old female with history of psoriasis, depression, anxiety who presents to the emergency department with complaints of left lower quadrant pain, diarrhea for the past 2 weeks.  Patient was found to have ruptured appendicitis with small abscess on 04/30/2019.  She was admitted to the hospital for IV Rocephin and Flagyl and discharged home on 05/03/2019 oral Cipro and Flagyl.  Return to the hospital on 05/06/2019 for worsening abdominal pain with repeat CT at that time again redemonstrating appendicitis with periappendiceal phlegmon.  Patient taken to the operating room 05/07/2019 for appendectomy and discharged home the next day.  Reports after appendectomy she was doing well until about 2 weeks ago she started having severe, sharp left lower abdominal pain with nausea, decreased appetite and watery, nonbloody diarrhea.  States that she was put on oral vancomycin for several days and tested for C. difficile which she was told was negative and told to stop the oral antibiotics.  She reports her symptoms have not improved at all.  No known history of inflammatory bowel disease.  No dysuria, hematuria, vaginal bleeding or discharge.  No other abdominal surgeries.  She reports she had a repeat CT of the abdomen pelvis on 05/25/2019 that showed colitis.  No known history of IBD for herself or family members.  CTAP 05/25/19:  COMPARISON:  CT scan dated 05/06/2019  FINDINGS: Lower chest: Normal.  Hepatobiliary: Small area of focal fatty infiltration in the left lobe of the liver adjacent to the falciform ligament as previously noted. Hepatic parenchyma is otherwise normal. Biliary tree is normal.  Pancreas: Unremarkable. No pancreatic ductal dilatation or surrounding inflammatory changes.  Spleen: Normal in size without focal abnormality.  Adrenals/Urinary Tract: Adrenal glands are unremarkable.  Kidneys are normal, without renal calculi, focal lesion, or hydronephrosis. Bladder is unremarkable.  Stomach/Bowel: The inflammatory process in the right lower quadrant seen on the prior CT scan has completely resolved with no residual abdominal abscess. Contrast is seen to fill what appears to be a prominent appendiceal stump above the level of the staple line. This is best seen on image 30 of series 5 the patient has edema of the mucosa of the sigmoid portion of the colon and in the rectum which could represent colitis. The bowel otherwise appears normal. And image 57 of series 2 and images 31 through 36 of series 6.  Vascular/Lymphatic: No significant vascular findings are present. No enlarged abdominal or pelvic lymph nodes.  Reproductive: Uterus and bilateral adnexa are unremarkable.  Other: No abdominal wall hernia or abnormality. No abdominopelvic ascites.  Musculoskeletal: No acute or significant osseous findings.  IMPRESSION: 1. Complete resolution of the inflammatory process in the right lower quadrant with no residual abdominal abscess. 2. Edema of the mucosa of the sigmoid portion of the colon and in the rectum which could represent colitis, possibly due to antibiotic therapy. 3. Prominent appendiceal stump.  ROS: See HPI Constitutional: no fever  Eyes: no drainage  ENT: no runny nose   Cardiovascular:  no chest pain  Resp: no SOB  GI: no vomiting; + diarrhea GU: no dysuria Integumentary: no rash  Allergy: no hives  Musculoskeletal: no leg swelling  Neurological: no slurred speech ROS otherwise negative  PAST MEDICAL HISTORY/PAST SURGICAL HISTORY:  Past Medical History:  Diagnosis Date  . Anxiety   . Depression   . Psoriasis   . Scoliosis     MEDICATIONS:  Prior  to Admission medications   Medication Sig Start Date End Date Taking? Authorizing Provider  acetaminophen (TYLENOL) 500 MG tablet Take 2 tablets (1,000 mg total) by mouth every 6  (six) hours as needed. Patient taking differently: Take 1,000 mg by mouth every 6 (six) hours as needed for mild pain or headache.  05/03/19   Saverio Danker, PA-C  amphetamine-dextroamphetamine (ADDERALL XR) 20 MG 24 hr capsule Take 20 mg by mouth as needed (work days).  10/13/18   [provider]  clotrimazole-betamethasone (LOTRISONE) cream Apply 1 application topically 2 (two) times daily as needed (rash).  10/09/18   [provider]  cyclobenzaprine (FLEXERIL) 5 MG tablet Take 5 mg by mouth at bedtime as needed for muscle spasms.  04/20/19   [provider]  dicyclomine (BENTYL) 20 MG tablet Take 20 mg by mouth 4 (four) times daily as needed for spasms.  04/20/19   [provider]  DULoxetine (CYMBALTA) 20 MG capsule Take 20 mg by mouth 2 (two) times daily. 04/18/19   [provider]  ibuprofen (ADVIL) 600 MG tablet Take 1 tablet (600 mg total) by mouth every 6 (six) hours as needed for headache, mild pain or moderate pain. 05/03/19   Saverio Danker, PA-C  methocarbamol (ROBAXIN) 500 MG tablet Take 1 tablet (500 mg total) by mouth every 6 (six) hours as needed for muscle spasms. 05/08/19   Maczis, Barth Kirks, PA-C  metoprolol succinate (TOPROL XL) 25 MG 24 hr tablet Take 1 tablet (25 mg total) by mouth at bedtime. 03/12/19   Lelon Perla, MD  norethindrone-ethinyl estradiol (JUNEL 1/20) 1-20 MG-MCG tablet Take 1 tablet by mouth at bedtime.  08/17/18   [provider]  ondansetron (ZOFRAN-ODT) 4 MG disintegrating tablet Take 1 tablet (4 mg total) by mouth every 6 (six) hours as needed for nausea. 05/03/19   Saverio Danker, PA-C  oxyCODONE (OXY IR/ROXICODONE) 5 MG immediate release tablet Take 1 tablet (5 mg total) by mouth every 6 (six) hours as needed for breakthrough pain. 05/08/19   Maczis, Barth Kirks, PA-C  Secukinumab, 300 MG Dose, (COSENTYX, 300 MG DOSE,) 150 MG/ML SOSY Inject 300 mg into the skin every 30 (thirty) days.    [provider]     ALLERGIES:  Allergies  Allergen Reactions  . Penicillin G Rash    SOCIAL HISTORY:  Social History   Tobacco Use  . Smoking status: Never Smoker  . Smokeless tobacco: Never Used  Substance Use Topics  . Alcohol use: Yes    Comment: Rare    FAMILY HISTORY: Family History  Problem Relation Age of Onset  . Psoriasis Mother   . Healthy Brother     EXAM: BP (!) 123/92 (BP Location: Left Arm)   Pulse 98   Temp 98.2 F (36.8 C) (Oral)   Resp 16   SpO2 100%  CONSTITUTIONAL: Alert and oriented and responds appropriately to questions. Well-appearing; well-nourished HEAD: Normocephalic EYES: Conjunctivae clear, pupils appear equal, EOM appear intact ENT: normal nose; moist mucous membranes NECK: Supple, normal ROM CARD: RRR; S1 and S2 appreciated; no murmurs, no clicks, no rubs, no gallops RESP: Normal chest excursion without splinting or tachypnea; breath sounds clear and equal bilaterally; no wheezes, no rhonchi, no rales, no hypoxia or respiratory distress, speaking full sentences ABD/GI: Normal bowel sounds; non-distended; soft, laparoscopic incision sites appear to be healing well without bleeding, drainage, redness or warmth, patient is tender to palpation in the left lower quadrant and pelvic region, no rebound, no  guarding, no peritoneal signs, no hepatosplenomegaly BACK:  The back appears normal EXT: Normal ROM in all joints; no deformity noted, no edema; no cyanosis SKIN: Normal color for age and race; warm; no rash on exposed skin NEURO: Moves all extremities equally PSYCH: The patient's mood and manner are appropriate.   MEDICAL DECISION MAKING: Patient here with left lower quadrant pain, diarrhea.  States she has been ruled out for C. difficile.  She is not sure if other GI pathogens were checked.  She had repeat CT imaging on 05/25/2019 which showed colitis but complete resolution of inflammatory process in the right lower quadrant with no residual abscess.  Patient  has had 3 CT scans in the past month.  I do not feel repeat CT imaging is warranted at this time given she had similar pain 6 days ago when her last CT was performed.  Patient seems to be very apprehensive about going back on antibiotics.  She states that she was ruled out for C. difficile.  She has not followed up with a gastroenterologist.  Her labs and urine here have been reviewed/interpreted and show no significant abnormality or sign of dehydration.  No leukocytosis.  No fever.  She is actually quite tender in the pelvic region but has no GU symptoms.  We will proceed with transvaginal ultrasound.  If unremarkable, anticipate discussion with general surgery just to close the loop since they have been following her closely and have ordered her most recent CT scan and C. difficile studies.  Anticipate follow-up with gastroenterology as an outpatient.  ED PROGRESS: Transvaginal ultrasound has been reviewed/interpreted.  Left ovary has not been visualized but otherwise no acute abnormality appreciated.  Doubt torsion given ovary not visualized at this point I would expect that the ovary would be quite large if this was from a torsion and no inflammatory changes seen on ultrasound.  Spoke with Dr. Rosendo Gros with general surgery.  He has reviewed surgery records that show she was just recently seen in the office and was told she had a negative C. difficile.  Dr. Rosendo Gros agrees with starting her back on Cipro and Flagyl and having her follow-up with gastroenterology.  Will discharge with refill of her Percocet as well as Bentyl, Zofran, probiotics.  Will give GI follow-up information.  Patient was able to have 1 small formed bowel movement here, no diarrhea.  This has been sent for so further evaluation.  Patient and father at bedside are comfortable with this plan.  Patient is very well-appearing here, nontoxic, well-hydrated with nonsurgical abdomen and reassuring labs, urine.  She has been able to tolerate p.o.  here.   At this time, I do not feel there is any life-threatening condition present. I have reviewed, interpreted and discussed all results (EKG, imaging, lab, urine as appropriate) and exam findings with patient/family. I have reviewed nursing notes and appropriate previous records.  I feel the patient is safe to be discharged home without further emergent workup and can continue workup as an outpatient as needed. Discussed usual and customary return precautions. Patient/family verbalize understanding and are comfortable with this plan.  Outpatient follow-up has been provided as needed. All questions have been answered.    Evelyn Goodwin was evaluated in Emergency Department on 05/31/2019 for the symptoms described in the history of present illness. She was evaluated in the context of the global COVID-19 pandemic, which necessitated consideration that the patient might be at risk for infection with the SARS-CoV-2 virus that causes COVID-19.  Institutional protocols and algorithms that pertain to the evaluation of patients at risk for COVID-19 are in a state of rapid change based on information released by regulatory bodies including the CDC and federal and state organizations. These policies and algorithms were followed during the patient's care in the ED.      Laporcha Marchesi, Delice Bison, DO 05/31/19 [phone]

## 2019-06-11 ENCOUNTER — Telehealth: Payer: Self-pay | Admitting: Nurse Practitioner

## 2019-06-12 ENCOUNTER — Other Ambulatory Visit: Payer: Self-pay | Admitting: Physician Assistant

## 2019-06-12 ENCOUNTER — Other Ambulatory Visit (HOSPITAL_COMMUNITY): Payer: Self-pay | Admitting: Physician Assistant

## 2019-06-12 ENCOUNTER — Ambulatory Visit (HOSPITAL_BASED_OUTPATIENT_CLINIC_OR_DEPARTMENT_OTHER): Admission: RE | Admit: 2019-06-12 | Payer: BC Managed Care – PPO | Source: Ambulatory Visit

## 2019-06-12 DIAGNOSIS — T50905A Adverse effect of unspecified drugs, medicaments and biological substances, initial encounter: Secondary | ICD-10-CM

## 2019-06-12 DIAGNOSIS — D72829 Elevated white blood cell count, unspecified: Secondary | ICD-10-CM

## 2019-06-12 NOTE — Telephone Encounter (Signed)
The pt states that she has no questions at this time.  She says she doesn't even remember calling and will keep appt for tomorrow with Janett Billow

## 2019-06-12 NOTE — Telephone Encounter (Signed)
Left message on machine to call back  

## 2019-06-13 ENCOUNTER — Encounter: Payer: Self-pay | Admitting: Gastroenterology

## 2019-06-13 ENCOUNTER — Encounter (HOSPITAL_BASED_OUTPATIENT_CLINIC_OR_DEPARTMENT_OTHER): Payer: Self-pay

## 2019-06-13 ENCOUNTER — Other Ambulatory Visit: Payer: Self-pay

## 2019-06-13 ENCOUNTER — Ambulatory Visit (HOSPITAL_BASED_OUTPATIENT_CLINIC_OR_DEPARTMENT_OTHER)
Admission: RE | Admit: 2019-06-13 | Discharge: 2019-06-13 | Disposition: A | Discharge status: Home / Self Care | Payer: BC Managed Care – PPO | Source: Ambulatory Visit | Attending: Physician Assistant | Admitting: Physician Assistant

## 2019-06-13 ENCOUNTER — Ambulatory Visit (INDEPENDENT_AMBULATORY_CARE_PROVIDER_SITE_OTHER): Payer: BC Managed Care – PPO | Admitting: Gastroenterology

## 2019-06-13 ENCOUNTER — Telehealth: Payer: Self-pay | Admitting: Gastroenterology

## 2019-06-13 VITALS — BP 94/68 | HR 92 | Ht 67.0 in | Wt 106.1 lb

## 2019-06-13 DIAGNOSIS — R197 Diarrhea, unspecified: Secondary | ICD-10-CM

## 2019-06-13 DIAGNOSIS — R1031 Right lower quadrant pain: Secondary | ICD-10-CM | POA: Diagnosis not present

## 2019-06-13 DIAGNOSIS — D72829 Elevated white blood cell count, unspecified: Secondary | ICD-10-CM

## 2019-06-13 MED ORDER — IOHEXOL 300 MG/ML  SOLN
100.0000 mL | Freq: Once | INTRAMUSCULAR | Status: AC | PRN
  Administered 2019-06-13: 100 mL via INTRAVENOUS

## 2019-06-13 NOTE — Patient Instructions (Signed)
If you are age 24 or older, your body mass index should be between 23-30. Your Body mass index is 16.62 kg/m. If this is out of the aforementioned range listed, please consider follow up with your Primary Care Provider.  If you are age 52 or younger, your body mass index should be between 19-25. Your Body mass index is 16.62 kg/m. If this is out of the aformentioned range listed, please consider follow up with your Primary Care Provider.   Your provider has requested that you go to the basement level for lab work before leaving today. Press "B" on the elevator. The lab is located at the first door on the left as you exit the elevator.

## 2019-06-13 NOTE — Progress Notes (Signed)
06/13/2019 Evelyn Goodwin [id number] 03/05/1995   HISTORY OF PRESENT ILLNESS: This is a 24 year old female who is new to our office.  She has been referred here by [name], PA-C, for evaluation regarding abdominal pain.  Patient was diagnosed with ruptured appendicitis with abscess in April.  Was treated with antibiotics, but abscess got larger and she subsequently underwent surgery on April 19.  Since that time she has continued to have issues with abdominal pain diffusely, but more localized to the right lower quadrant, as well as diarrhea.  She says that she has diarrhea at least 3-4 times a day.  Has had diarrhea waking her up throughout the night as well.  She has lost about 10 pounds.  She had a CT scan performed earlier this month on May 7 that showed complete resolution of the inflammatory process in the right lower quadrant with no residual abdominal abscess.  They noted prominent appendiceal stump.  They also noted edema of the mucosa of the sigmoid portion of the colon and in the rectum which could represent colitis possibly due to antibiotic therapy.  They again treated her with Cipro and Flagyl for this possible "colitis".  She tells me that she had a negative C. difficile stool study about 3 weeks or so ago by the surgeons.  On May 21 her white blood cell count increased to 25.5.  She actually had repeat labs this morning and is scheduled for a CT scan this afternoon.  She tells me that prior to all this she did not have any GI problems at all, no issues moving her bowels, etc.   Past Medical History:  Diagnosis Date  . Anxiety   . Depression   . Psoriasis   . Scoliosis    Past Surgical History:  Procedure Laterality Date  . APPENDECTOMY    . LAPAROSCOPIC APPENDECTOMY N/A 05/07/2019   Procedure: APPENDECTOMY LAPAROSCOPIC;  Surgeon: Georganna Skeans, MD;  Location: [facility];  Service: General;  Laterality: N/A;  . WISDOM TOOTH EXTRACTION  2014    reports that she  has never smoked. She has never used smokeless tobacco. She reports current alcohol use. She reports previous drug use. family history includes Healthy in her brother; Psoriasis in her mother. Allergies  Allergen Reactions  . Penicillin G Rash      Outpatient Encounter Medications as of 06/13/2019  Medication Sig  . amphetamine-dextroamphetamine (ADDERALL XR) 20 MG 24 hr capsule Take 20 mg by mouth as needed (work days).   . cyclobenzaprine (FLEXERIL) 5 MG tablet Take 5 mg by mouth at bedtime as needed for muscle spasms.   Marland Kitchen dicyclomine (BENTYL) 20 MG tablet Take 1 tablet (20 mg total) by mouth every 8 (eight) hours as needed for spasms (Abdominal cramping).  . DULoxetine (CYMBALTA) 20 MG capsule Take 20 mg by mouth 2 (two) times daily.  . norethindrone-ethinyl estradiol (JUNEL 1/20) 1-20 MG-MCG tablet Take 1 tablet by mouth at bedtime.   . ondansetron (ZOFRAN ODT) 4 MG disintegrating tablet Take 1 tablet (4 mg total) by mouth every 6 (six) hours as needed.  Marland Kitchen oxyCODONE-acetaminophen (PERCOCET/ROXICET) 5-325 MG tablet Take 2 tablets by mouth every 6 (six) hours as needed.  . Secukinumab, 300 MG Dose, (COSENTYX, 300 MG DOSE,) 150 MG/ML SOSY Inject 300 mg into the skin every 30 (thirty) days.  . [DISCONTINUED] ciprofloxacin (CIPRO) 500 MG tablet Take 1 tablet (500 mg total) by mouth 2 (two) times daily. (Patient not taking: Reported on 06/13/2019)  . [  DISCONTINUED] clotrimazole-betamethasone (LOTRISONE) cream Apply 1 application topically 2 (two) times daily as needed (rash).   . [DISCONTINUED] methocarbamol (ROBAXIN) 500 MG tablet Take 1 tablet (500 mg total) by mouth every 6 (six) hours as needed for muscle spasms. (Patient not taking: Reported on 06/13/2019)  . [DISCONTINUED] metoprolol succinate (TOPROL XL) 25 MG 24 hr tablet Take 1 tablet (25 mg total) by mouth at bedtime. (Patient not taking: Reported on 05/31/2019)  . [DISCONTINUED] metroNIDAZOLE (FLAGYL) 500 MG tablet Take 1 tablet (500 mg  total) by mouth 3 (three) times daily. (Patient not taking: Reported on 06/13/2019)  . [DISCONTINUED] Probiotic CAPS Take 1 capsule by mouth daily. (Patient not taking: Reported on 06/13/2019)   No facility-administered encounter medications on file as of 06/13/2019.     REVIEW OF SYSTEMS  : All other systems reviewed and negative except where noted in the History of Present Illness.   PHYSICAL EXAM: BP 94/68 (BP Location: Left Arm, Patient Position: Sitting, Cuff Size: Normal)   Pulse 92   Ht 5' 7"  (1.702 m)   Wt 106 lb 2 oz (48.1 kg)   SpO2 100%   BMI 16.62 kg/m  General: Well developed white female in no acute distress Head: Normocephalic and atraumatic Eyes:  Sclerae anicteric, conjunctiva pink. Ears: Normal auditory acuity Lungs: Clear throughout to auscultation; no increased WOB. Heart: Regular rate and rhythm; no M/R/G. Abdomen: Soft, non-distended.  BS present.  Diffuse TTP more so in the RLQ.  Recent incision sites noted. Musculoskeletal: Symmetrical with no gross deformities  Skin: No lesions on visible extremities Extremities: No edema  Neurological: Alert oriented x 4, grossly non-focal Psychological:  Alert and cooperative. Normal mood and affect  ASSESSMENT AND PLAN: *24 year old female with recent ruptured appendicitis with abscess for which she was treated with antibiotics and subsequently then underwent surgery.  Has had issues with continued abdominal pain and diarrhea since this all occurred.  One CT scan suggested edema of the mucosa of the sigmoid portion of the colon and then the rectum possibly representing colitis.  She has had a C. difficile stool study a few weeks ago that was negative.  Recent white blood cell count elevated at 25.5 just 5 days ago.  She had repeat labs this morning and is scheduled for a CT scan again this afternoon.  We will see with the results of the repeat CT scan show.  I would also like to repeat stool studies.  Pending all those results  we may need to consider colonoscopy, but that is something that I definitely would have to discuss with Dr. Tarri Glenn first.  We may also consider trial of Xifaxan as well to help reset her gut bacteria.   CC:  Ward, Delice Bison, DO

## 2019-06-13 NOTE — Telephone Encounter (Signed)
Patient's CT scan from this afternoon shows diffuse colitis.  I spoke with the PA with patient's primary care and then I spoke with the patient as well.  Would be very unusual for her to have developed an inflammatory bowel disease in such a short time as she had no GI complaints prior to this issue with the appendicitis.  That being said, will rule out infectious source first.  She will return no stool studies to [place] tomorrow morning.  I am giving her a note to stay out of work tomorrow.  I think specifically if C. difficile is negative then we need to plan to proceed with colonoscopy with biopsies to evaluate for IBD.  Patient has been covid vaccinated x 2, last in January so no covid testing will be needed.  WBC count did decrease to 15K today.

## 2019-06-13 NOTE — Progress Notes (Signed)
Reviewed and agree with management plans. ? ?Ingri Diemer L. Sheccid Lahmann, MD, MPH  ?

## 2019-06-14 ENCOUNTER — Telehealth: Payer: Self-pay

## 2019-06-14 ENCOUNTER — Other Ambulatory Visit: Payer: Self-pay

## 2019-06-14 ENCOUNTER — Encounter: Payer: Self-pay | Admitting: *Deleted

## 2019-06-14 ENCOUNTER — Other Ambulatory Visit: Payer: BC Managed Care – PPO

## 2019-06-14 ENCOUNTER — Other Ambulatory Visit: Payer: Self-pay | Admitting: *Deleted

## 2019-06-14 DIAGNOSIS — R1031 Right lower quadrant pain: Secondary | ICD-10-CM

## 2019-06-14 DIAGNOSIS — R197 Diarrhea, unspecified: Secondary | ICD-10-CM

## 2019-06-14 NOTE — Telephone Encounter (Signed)
Fecal Calprotectin lab ordered.

## 2019-06-14 NOTE — Progress Notes (Signed)
ce

## 2019-06-14 NOTE — Telephone Encounter (Signed)
Patient walked into the office and requested a work note for today, she was seen yesterday and had testing today.

## 2019-06-14 NOTE — Telephone Encounter (Signed)
Please add a fecal calprotectin to the labs that have already been ordered. Thank you.

## 2019-06-15 ENCOUNTER — Telehealth: Payer: Self-pay | Admitting: Gastroenterology

## 2019-06-15 ENCOUNTER — Telehealth: Payer: Self-pay | Admitting: *Deleted

## 2019-06-15 MED ORDER — VANCOMYCIN HCL 125 MG PO CAPS: 125.0000 mg | ORAL_CAPSULE | Freq: Four times a day (QID) | ORAL | 0 refills | Status: AC

## 2019-06-15 MED ORDER — SACCHAROMYCES BOULARDII 250 MG PO CAPS
250.0000 mg | ORAL_CAPSULE | Freq: Two times a day (BID) | ORAL | 3 refills | Status: DC
Start: 2019-06-15 — End: 2020-11-10

## 2019-06-15 NOTE — Telephone Encounter (Signed)
Pt called to inform that she was almost up all night going to the bathroom. She would like a call back as soon as her stool tests results are back.

## 2019-06-15 NOTE — Telephone Encounter (Signed)
The pt will be called as soon as results are received

## 2019-06-15 NOTE — Telephone Encounter (Signed)
-----   Message from Loralie Champagne, PA-C sent at 06/15/2019  4:08 PM EDT ----- Patient does in fact have C. difficile.  We will not plan for colonoscopy.  Needs to be treated with vancomycin 125 mg 4 times daily for 14 days.  Please send prescription to her pharmacy.  She needs to remain out of work preferably until the diarrhea resolves, but at least 7 to 10 days.  Please provide her a work note.  Needs to be instructed on the contagious nature of this bacteria and that she should bleach and sanitize her bathroom and be sure that she is washing her hands well after each bowel movement.  I also recommend that she obtain Florastor probiotic and begin taking that twice daily, which she should continue for a month or more after completing the antibiotic as well.  Thank you,  Jess

## 2019-06-15 NOTE — Telephone Encounter (Signed)
Informed patient of results. Sent script for Vancomycin and Florastor to pharmacy. Patient will call back with information needed on work note.

## 2019-06-17 LAB — GI PROFILE, STOOL, PCR

## 2019-06-17 LAB — CLOSTRIDIUM DIFFICILE TOXIN B, QUALITATIVE, REAL-TIME PCR: Toxigenic C. Difficile by PCR: DETECTED — AB

## 2019-06-20 ENCOUNTER — Ambulatory Visit: Payer: BC Managed Care – PPO | Admitting: Nurse Practitioner

## 2019-06-21 ENCOUNTER — Telehealth: Payer: Self-pay | Admitting: Gastroenterology

## 2019-06-21 NOTE — Telephone Encounter (Signed)
Her pain could certainly all be related to her C. difficile.  It is causing a lot of inflammation throughout her colon.  I would not do any other type of gallbladder work-up or anything like that for now.  We will see how she feels when she completes her vancomycin.  I would not plan to proceed with colonoscopy unless symptoms are persistent despite treatment and confirmed not to be from an ongoing C. difficile infection.  Please schedule her to see me back again in about 3 weeks.  Thank you,  Janett Billow

## 2019-06-21 NOTE — Telephone Encounter (Signed)
Spoke with patient regarding recommendations per Christianne Dolin , patient scheduled follow up for 07/17/2019 at 9:30 am. Pt had no other concerns at this time.

## 2019-06-21 NOTE — Telephone Encounter (Signed)
Spoke with patient, patient states that she has been having a low grade fever, currently 99.0, pt states that she no longer has diarrhea, pt reports that stools are firm but mushy. Pt states that she was having a sharp, stabbing pain in her RUQ, pt states that she spoke with her PCP yesterday and they suggested a further workup because it may be her gallbladder. Patient also wanted to know if you would still like for her to have a colonoscopy after she is done with treatment for C. Diff? Please advise. Thank you.

## 2019-06-25 NOTE — Telephone Encounter (Signed)
Crecencio Mc I faxed a letter to the pt employer that she may return to work.  She started abx on 5/27 for C diff and is much better.

## 2019-06-25 NOTE — Telephone Encounter (Signed)
Pt called again stating that she needs work note asap because her boss is demanding it right away.

## 2019-06-25 NOTE — Telephone Encounter (Signed)
Patient is requesting a work note letter to be fax to [phone] Attn: Arvil Persons if possible today. Please call her to advise once done.

## 2019-06-27 LAB — CALPROTECTIN: Calprotectin: 1530 mcg/g — ABNORMAL HIGH

## 2019-07-02 ENCOUNTER — Other Ambulatory Visit: Payer: Self-pay | Admitting: Physician Assistant

## 2019-07-02 NOTE — Telephone Encounter (Signed)
Phone call to patient to see which Pharmacy she's referring to because we haven't received anything from the Pharmacy at all.  Patient states she'll get [place] the information regarding the Pharmacy and call us back.

## 2019-07-02 NOTE — Telephone Encounter (Signed)
Phone call to patient stating that she spoke with the Pharmacy and they informed her that they would contact us.

## 2019-07-02 NOTE — Telephone Encounter (Signed)
Patient is calling to say that our office needs to contact the pharmacy to establish that Evelyn Goodwin is still on Cosenty[employer]- The Cover till you cover program.  (Chart # [id number])

## 2019-07-03 ENCOUNTER — Other Ambulatory Visit: Payer: Self-pay

## 2019-07-03 DIAGNOSIS — L4050 Arthropathic psoriasis, unspecified: Secondary | ICD-10-CM

## 2019-07-03 MED ORDER — COSENTYX (300 MG DOSE) 150 MG/ML ~~LOC~~ SOSY: 300.0000 mg | PREFILLED_SYRINGE | SUBCUTANEOUS | 9 refills | Status: DC

## 2019-07-04 ENCOUNTER — Telehealth: Payer: Self-pay | Admitting: Physician Assistant

## 2019-07-04 NOTE — Telephone Encounter (Signed)
Pharmacy left message on office voice mail saying that the pharmacy needed clarification on prescription.  To speak with pharmacist at [facility] [phone].  (Chart # [id number])

## 2019-07-04 NOTE — Telephone Encounter (Signed)
Phone call to [facility] [employer] to give them clarification on patient's Cosentyx. Per [facility] [employer] the patient has been getting the auto-injection syringe and the prescription was sent it for prefilled syringe, so they wanted to know if it could be changed back. Verbal given to change back to auto-injection.

## 2019-07-10 ENCOUNTER — Telehealth: Payer: Self-pay | Admitting: Gastroenterology

## 2019-07-10 DIAGNOSIS — R197 Diarrhea, unspecified: Secondary | ICD-10-CM

## 2019-07-10 NOTE — Telephone Encounter (Signed)
The pt has been advised to come in for stool testing or go to the ED if she feels very bad.  Pt agreed.

## 2019-07-10 NOTE — Telephone Encounter (Signed)
Just a Cdiff PCR please.  If she feels very terrible then may need to go to the ED.

## 2019-07-10 NOTE — Telephone Encounter (Signed)
Patient is calling and states she is having a lot of pain abd\rectal\vomitting\bloating and diarrhea seeking help

## 2019-07-10 NOTE — Telephone Encounter (Signed)
The pt was diagnosed with C diff in May and finished a course of Vanc.  She says she felt much better for a few weeks and now has diarrhea, bloating and nausea again.  Do you want her to come in for another GI path panel?

## 2019-07-11 ENCOUNTER — Other Ambulatory Visit: Payer: BC Managed Care – PPO

## 2019-07-13 ENCOUNTER — Other Ambulatory Visit: Payer: BC Managed Care – PPO

## 2019-07-13 DIAGNOSIS — R197 Diarrhea, unspecified: Secondary | ICD-10-CM

## 2019-07-16 ENCOUNTER — Telehealth: Payer: Self-pay | Admitting: Gastroenterology

## 2019-07-16 LAB — CLOSTRIDIUM DIFFICILE TOXIN B, QUALITATIVE, REAL-TIME PCR: Toxigenic C. Difficile by PCR: DETECTED — AB

## 2019-07-16 NOTE — Telephone Encounter (Signed)
The pt has been advised that the stool sample will take several days to come back.  She will keep appt for tomorrow as planned and we will call her when results are available.

## 2019-07-17 ENCOUNTER — Encounter: Payer: Self-pay | Admitting: Gastroenterology

## 2019-07-17 ENCOUNTER — Ambulatory Visit (INDEPENDENT_AMBULATORY_CARE_PROVIDER_SITE_OTHER): Payer: BC Managed Care – PPO | Admitting: Gastroenterology

## 2019-07-17 VITALS — HR 72 | Ht 67.0 in | Wt 105.0 lb

## 2019-07-17 DIAGNOSIS — A09 Infectious gastroenteritis and colitis, unspecified: Secondary | ICD-10-CM | POA: Diagnosis not present

## 2019-07-17 DIAGNOSIS — A0472 Enterocolitis due to Clostridium difficile, not specified as recurrent: Secondary | ICD-10-CM | POA: Diagnosis not present

## 2019-07-17 MED ORDER — DICYCLOMINE HCL 20 MG PO TABS: 20.0000 mg | ORAL_TABLET | Freq: Three times a day (TID) | ORAL | 1 refills | Status: DC | PRN

## 2019-07-17 MED ORDER — VANCOMYCIN HCL 125 MG PO CAPS: ORAL_CAPSULE | ORAL | 0 refills | Status: DC

## 2019-07-17 MED ORDER — ONDANSETRON 4 MG PO TBDP: 4.0000 mg | ORAL_TABLET | Freq: Four times a day (QID) | ORAL | 0 refills | Status: DC | PRN

## 2019-07-17 NOTE — Patient Instructions (Addendum)
If you are age 24 or older, your body mass index should be between 23-30. Your Body mass index is 16.45 kg/m. If this is out of the aforementioned range listed, please consider follow up with your Primary Care Provider.  If you are age 41 or younger, your body mass index should be between 19-25. Your Body mass index is 16.45 kg/m. If this is out of the aformentioned range listed, please consider follow up with your Primary Care Provider.   START Vancomycin 125 mg 1 capsule 4 times a day for 2 weeks, then 1 capsule 2 times a day for 1 week, then 1 capsule a day for 1 week, then 1 capsule every other day for a month.  CONTINUE Ondansetron and Bentyl  All medications have been sent to your pharmacy.  Call the office on Thursday to update [place] on how you are doing. Please check and see if our August schedule is open to make a 6 week follow up.

## 2019-07-17 NOTE — Progress Notes (Signed)
07/17/2019 Evelyn Goodwin [id number] 1995/12/31   HISTORY OF PRESENT ILLNESS: This is a 24 year old female who was seen by me initially on Jun 13, 2019.  Please see that note for further details regarding her history up to that point.  As a brief summary, she had ruptured appendicitis in April was treated with antibiotics, but abscess got larger and she subsequently underwent surgery on April 19.  When I saw her she had continued abdominal pain and diarrhea.  Was diagnosed with C. Difficile after my visit with her.  Had a CT scan that showed diffuse colonic wall thickening and inflammation throughout the colon to the rectum.  She was treated with vancomycin 125 mg 4 times daily for 2 weeks.  Her symptoms did improve and her stools were definitely forming up, but then she started with diarrhea again and feels the exact same way that she had felt previously.  Stool for C. difficile was again ordered on June 25 and just returned this morning as a positive study.  She continues to complain of diffuse abdominal pain and diarrhea multiple times a day.  Having diarrhea at night as well and has difficulty getting comfortable and sleeping.  Past Medical History:  Diagnosis Date  . Anxiety   . Depression   . Psoriasis   . Scoliosis    Past Surgical History:  Procedure Laterality Date  . APPENDECTOMY    . LAPAROSCOPIC APPENDECTOMY N/A 05/07/2019   Procedure: APPENDECTOMY LAPAROSCOPIC;  Surgeon: Georganna Skeans, MD;  Location: [facility];  Service: General;  Laterality: N/A;  . WISDOM TOOTH EXTRACTION  2014    reports that she has never smoked. She has never used smokeless tobacco. She reports current alcohol use. She reports previous drug use. family history includes Healthy in her brother; Psoriasis in her mother. Allergies  Allergen Reactions  . Penicillin G Rash      Outpatient Encounter Medications as of 07/17/2019  Medication Sig  . amphetamine-dextroamphetamine (ADDERALL XR) 20 MG 24 hr  capsule Take 20 mg by mouth as needed (work days).   . cyclobenzaprine (FLEXERIL) 5 MG tablet Take 5 mg by mouth at bedtime as needed for muscle spasms.   Marland Kitchen dicyclomine (BENTYL) 20 MG tablet Take 1 tablet (20 mg total) by mouth every 8 (eight) hours as needed for spasms (Abdominal cramping).  . DULoxetine (CYMBALTA) 20 MG capsule Take 20 mg by mouth 2 (two) times daily.  . norethindrone-ethinyl estradiol (JUNEL 1/20) 1-20 MG-MCG tablet Take 1 tablet by mouth at bedtime.   . ondansetron (ZOFRAN ODT) 4 MG disintegrating tablet Take 1 tablet (4 mg total) by mouth every 6 (six) hours as needed.  Marland Kitchen oxyCODONE-acetaminophen (PERCOCET/ROXICET) 5-325 MG tablet Take 2 tablets by mouth every 6 (six) hours as needed.  . saccharomyces boulardii (FLORASTOR) 250 MG capsule Take 1 capsule (250 mg total) by mouth 2 (two) times daily.  . Secukinumab, 300 MG Dose, (COSENTYX, 300 MG DOSE,) 150 MG/ML SOSY Inject 300 mg into the skin every 30 (thirty) days.  . [DISCONTINUED] metoprolol succinate (TOPROL XL) 25 MG 24 hr tablet Take 1 tablet (25 mg total) by mouth at bedtime. (Patient not taking: Reported on 05/31/2019)   No facility-administered encounter medications on file as of 07/17/2019.    REVIEW OF SYSTEMS  : All other systems reviewed and negative except where noted in the History of Present Illness.   PHYSICAL EXAM: Pulse 72   Ht 5' 7"  (1.702 m)   Wt 105 lb (47.6  kg)   LMP 02/14/2018 (Exact Date)   BMI 16.45 kg/m  General: Well developed white female in no acute distress; non-toxic appearing Head: Normocephalic and atraumatic Eyes:  Sclerae anicteric, conjunctiva pink. Ears: Normal auditory acuity Lungs: Clear throughout to auscultation; no increased WOB. Heart: Regular rate and rhythm; no M/R/G. Abdomen: Soft, non-distended.  BS present.  Diffuse TTP. Musculoskeletal: Symmetrical with no gross deformities  Skin: No lesions on visible extremities Extremities: No edema  Neurological: Alert oriented  x 4, grossly non-focal Psychological:  Alert and cooperative. Normal mood and affect  ASSESSMENT AND PLAN: *C. difficile colitis: Completed 2-week course of vancomycin and symptoms improved for a very short time before starting with diarrhea and abdominal pain again.  C. difficile stool study from 4 days ago was positive again.  Continues with a lot of diarrhea and abdominal pain.  We will plan for vancomycin taper with 125 mg 4 times a day for 2 weeks, 125 mg twice a day for a week, 125 mg daily for a week, 125 mg every other day for 4 weeks tentatively.  Prescription sent to pharmacy.  I have recommended that she begin taking Florastor probiotic twice daily.  We will renew Zofran and Bentyl prescriptions.  These were all sent to her pharmacy.  She will again need to remain out of work.  Note was given for her to remain out for her next shift on Thursday of this week.  She will call back on Thursday with an update of her symptoms.  We will have her discuss with her dermatologist regarding her Cosentyx that she takes monthly for psoriasis as we may want to consider holding her next 1 or 2 doses while we are trying to clear up the C. difficile.   CC:  Heywood Bene, *

## 2019-07-18 NOTE — Progress Notes (Signed)
Reviewed and agree with management plans. ? ?Bao Coreas L. Sumie Remsen, MD, MPH  ?

## 2019-07-19 ENCOUNTER — Telehealth: Payer: Self-pay | Admitting: Gastroenterology

## 2019-07-19 NOTE — Telephone Encounter (Signed)
Patient calling to give update had diarrhea all day yesterday and today its been better just more brown slowing getting better please call and advise.

## 2019-07-19 NOTE — Telephone Encounter (Signed)
The pt has been advised and states she will call on Tuesday if her stools have not continued to firm up.  She will continue abx and florastor as prescribed

## 2019-07-19 NOTE — Telephone Encounter (Signed)
Great to hear that she is feeling a little better today.  Continue vancomycin and florastor.  In regards to work, she is supposed to return there on Sunday.  She will have 4 to 5 days of antibiotics in her system by that point.  As long as her stools are still firming up then she is okay to return to work, but if still having a lot of diarrhea then she will need to be out for a few more days.

## 2019-07-19 NOTE — Telephone Encounter (Signed)
Evelyn Goodwin the pt was asked to call with an update on her condition.  She says yesterday she had watery diarrhea all day and today the diarrhea has gotten some better.  Seems to have started to become solid.  Please advise

## 2019-07-22 ENCOUNTER — Emergency Department (HOSPITAL_COMMUNITY)
Admission: EM | Admit: 2019-07-22 | Discharge: 2019-07-23 | Disposition: A | Discharge status: Home / Self Care | Payer: BC Managed Care – PPO | Attending: Emergency Medicine | Admitting: Emergency Medicine

## 2019-07-22 ENCOUNTER — Encounter (HOSPITAL_COMMUNITY): Payer: Self-pay

## 2019-07-22 ENCOUNTER — Other Ambulatory Visit: Payer: Self-pay

## 2019-07-22 DIAGNOSIS — R21 Rash and other nonspecific skin eruption: Secondary | ICD-10-CM | POA: Diagnosis present

## 2019-07-22 DIAGNOSIS — L52 Erythema nodosum: Secondary | ICD-10-CM | POA: Diagnosis not present

## 2019-07-22 DIAGNOSIS — K828 Other specified diseases of gallbladder: Secondary | ICD-10-CM | POA: Diagnosis not present

## 2019-07-22 DIAGNOSIS — R110 Nausea: Secondary | ICD-10-CM | POA: Diagnosis not present

## 2019-07-22 DIAGNOSIS — Z79899 Other long term (current) drug therapy: Secondary | ICD-10-CM | POA: Diagnosis not present

## 2019-07-22 LAB — CBC
HCT: 40.2 % (ref 36.0–46.0)
Hemoglobin: 13 g/dL (ref 12.0–15.0)
MCH: 27.3 pg (ref 26.0–34.0)
MCHC: 32.3 g/dL (ref 30.0–36.0)
MCV: 84.5 fL (ref 80.0–100.0)
Platelets: 563 10*3/uL — ABNORMAL HIGH (ref 150–400)
RBC: 4.76 MIL/uL (ref 3.87–5.11)
RDW: 14.9 % (ref 11.5–15.5)
WBC: 17.6 10*3/uL — ABNORMAL HIGH (ref 4.0–10.5)
nRBC: 0 % (ref 0.0–0.2)

## 2019-07-22 LAB — LACTIC ACID, PLASMA
Lactic Acid, Venous: 2.4 mmol/L (ref 0.5–1.9)
Lactic Acid, Venous: 2.9 mmol/L (ref 0.5–1.9)

## 2019-07-22 LAB — BASIC METABOLIC PANEL
Anion gap: 8 (ref 5–15)
BUN: 7 mg/dL (ref 6–20)
CO2: 23 mmol/L (ref 22–32)
Calcium: 8.9 mg/dL (ref 8.9–10.3)
Chloride: 105 mmol/L (ref 98–111)
Creatinine, Ser: 0.75 mg/dL (ref 0.44–1.00)
GFR calc Af Amer: >60 mL/min (ref >60)
GFR calc non Af Amer: >60 mL/min (ref >60)
Glucose, Bld: 137 mg/dL — ABNORMAL HIGH (ref 70–99)
Potassium: 3.3 mmol/L — ABNORMAL LOW (ref 3.5–5.1)
Sodium: 136 mmol/L (ref 135–145)

## 2019-07-22 MED ORDER — LACTATED RINGERS IV BOLUS
1000.0000 mL | Freq: Once | INTRAVENOUS | Status: AC
  Administered 2019-07-22: 1000 mL via INTRAVENOUS

## 2019-07-22 MED ORDER — DIPHENHYDRAMINE HCL 50 MG/ML IJ SOLN: 50.0000 mg | Freq: Once | INTRAMUSCULAR | Status: AC

## 2019-07-22 MED ORDER — DIPHENHYDRAMINE HCL 50 MG/ML IJ SOLN
INTRAMUSCULAR | Status: AC
  Administered 2019-07-22: 50 mg via INTRAVENOUS
  Filled 2019-07-22: qty 1

## 2019-07-22 MED ORDER — EPINEPHRINE 0.3 MG/0.3ML IJ SOAJ
INTRAMUSCULAR | Status: AC
  Filled 2019-07-22: qty 0.3

## 2019-07-22 MED ORDER — HYDROXYZINE HCL 25 MG PO TABS
25.0000 mg | ORAL_TABLET | Freq: Once | ORAL | Status: AC
  Administered 2019-07-22: 25 mg via ORAL
  Filled 2019-07-22: qty 1

## 2019-07-22 NOTE — ED Triage Notes (Signed)
Pt arrives to ED w/ c/o possible allergic reaction to vancomycin. Pt taking vancomycin for c-diff. Pt c/o 10/10 generalized pain, hives, fever.

## 2019-07-22 NOTE — ED Notes (Signed)
PA notified of pt lactic acid.

## 2019-07-22 NOTE — ED Notes (Signed)
Resident, Dr. Alric Ran, notified of pt new lactic acid result.

## 2019-07-22 NOTE — ED Provider Notes (Signed)
[facility] EMERGENCY DEPARTMENT Provider Note   CSN: [id number] Arrival date & time: 07/22/19  1928     History Chief Complaint  Patient presents with  . Allergic Reaction    Evelyn Goodwin is a 24 y.o. female w PMHx significant for depression, anxiety, psoriasis, ulcerative colitis and Cdiff who presents with rash to b/l LE concerning for allergic reaction. Pt states she underwent appendectomy earlier this year and subsequently developed colitis/ulcerative colitis and C diff. Patient states she currently has a recurrence of cdiff being treated with vancomycin. Pt states she has worsening abdominal pain as well as pain to lower extremities in conjunction with rash. Patient states she is very anxious and concerned that she is having SJS reaction to vancomycin. Patient endorses nausea, wo emesis. Pt states she is having trouble breathing out of anxiety, but denies CP, swelling to mouth/lips/throat. Patient is handling own secretions. Patient denies anaphylactic reaction to other allergens (has never had anaphylaxis).     Past Medical History:  Diagnosis Date  . Anxiety   . Depression   . Psoriasis   . Scoliosis     Patient Active Problem List   Diagnosis Date Noted  . C. difficile colitis 07/17/2019  . Diarrhea 06/13/2019  . RLQ abdominal pain 06/13/2019  . Acute perforated appendicitis 05/06/2019  . Ruptured appendicitis 04/30/2019  . Anxiety and depression 10/11/2018  . Other idiopathic scoliosis, lumbar region 10/11/2018  . History of ADHD 10/11/2018  . 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 Psoriatic arthritis ([place]) 10/11/2018    Past Surgical History:  Procedure Laterality Date  . APPENDECTOMY    . LAPAROSCOPIC APPENDECTOMY N/A 05/07/2019   Procedure: APPENDECTOMY LAPAROSCOPIC;  Surgeon: Georganna Skeans, MD;  Location: [facility];  Service: General;  Laterality: N/A;  . WISDOM TOOTH EXTRACTION  2014     OB History   No obstetric history on file.      Family History  Problem Relation Age of Onset  . Psoriasis Mother   . Healthy Brother   . Colon cancer Neg Hx   . Esophageal cancer Neg Hx   . Pancreatic cancer Neg Hx   . Stomach cancer Neg Hx     Social History   Tobacco Use  . Smoking status: Never Smoker  . Smokeless tobacco: Never Used  Vaping Use  . Vaping Use: Never used  Substance Use Topics  . Alcohol use: Yes    Comment: Rare  . Drug use: Not Currently    Home Medications Prior to Admission medications   Medication Sig Start Date End Date Taking? Authorizing Provider  amphetamine-dextroamphetamine (ADDERALL XR) 20 MG 24 hr capsule Take 20 mg by mouth as needed (work days).  10/13/18   [provider]  cyclobenzaprine (FLEXERIL) 5 MG tablet Take 5 mg by mouth at bedtime as needed for muscle spasms.  04/20/19   [provider]  dicyclomine (BENTYL) 20 MG tablet Take 1 tablet (20 mg total) by mouth every 8 (eight) hours as needed for spasms (Abdominal cramping). 07/17/19   Zehr, Laban Emperor, PA-C  DULoxetine (CYMBALTA) 20 MG capsule Take 20 mg by mouth 2 (two) times daily. 04/18/19   [provider]  norethindrone-ethinyl estradiol (JUNEL 1/20) 1-20 MG-MCG tablet Take 1 tablet by mouth at bedtime.  08/17/18   [provider]  ondansetron (ZOFRAN ODT) 4 MG disintegrating tablet Take 1 tablet (4 mg total) by mouth every 6 (six) hours as needed. 07/17/19   Zehr, Laban Emperor, PA-C  oxyCODONE-acetaminophen (  PERCOCET/ROXICET) 5-325 MG tablet Take 2 tablets by mouth every 6 (six) hours as needed. 05/31/19   Ward, Delice Bison, DO  saccharomyces boulardii (FLORASTOR) 250 MG capsule Take 1 capsule (250 mg total) by mouth 2 (two) times daily. 06/15/19   Zehr, Janett Billow D, PA-C  Secukinumab, 300 MG Dose, (COSENTYX, 300 MG DOSE,) 150 MG/ML SOSY Inject 300 mg into the skin every 30 (thirty) days. 07/03/19   Clark-Bruning, Anderson Malta, PA-C  vancomycin (VANCOCIN HCL) 125 MG capsule Take 1 capsule (125 mg total) by  mouth 4 (four) times daily for 14 days, THEN 1 capsule (125 mg total) 2 (two) times daily for 7 days, THEN 1 capsule (125 mg total) daily for 7 days, THEN 1 capsule (125 mg total) every other day for 15 days. 07/17/19 08/29/19  Zehr, Janett Billow D, PA-C  metoprolol succinate (TOPROL XL) 25 MG 24 hr tablet Take 1 tablet (25 mg total) by mouth at bedtime. Patient not taking: Reported on 05/31/2019 03/12/19 05/31/19  Lelon Perla, MD    Allergies    Penicillin g  Review of Systems   Review of Systems  Constitutional: Negative for chills and fever.  HENT: Negative for drooling, facial swelling, mouth sores, rhinorrhea, sore throat and trouble swallowing.   Eyes: Positive for redness. Negative for visual disturbance.  Respiratory: Positive for shortness of breath. Negative for cough, chest tightness, wheezing and stridor.   Cardiovascular: Positive for leg swelling. Negative for chest pain.  Gastrointestinal: Positive for abdominal pain, diarrhea and nausea. Negative for vomiting.  Genitourinary: Negative for dysuria and hematuria.  Musculoskeletal: Negative for back pain and gait problem.  Skin: Positive for rash. Negative for wound.  Psychiatric/Behavioral: The patient is nervous/anxious.     Physical Exam Updated Vital Signs BP (!) 144/92   Pulse (!) 113   Temp 98.4 F (36.9 C) (Oral)   Resp 13   SpO2 96%   Physical Exam Vitals and nursing note reviewed.  Constitutional:      General: She is not in acute distress.    Appearance: Normal appearance. She is well-developed and normal weight. She is not ill-appearing, toxic-appearing or diaphoretic.  HENT:     Head: Normocephalic and atraumatic.     Nose: Nose normal.     Mouth/Throat:     Lips: Pink. No lesions.     Mouth: Mucous membranes are moist. No oral lesions.     Tongue: No lesions.     Pharynx: Oropharynx is clear. Uvula midline. No pharyngeal swelling, oropharyngeal exudate, posterior oropharyngeal erythema or uvula  swelling.     Comments: No oropharyngeal edema Eyes:     Conjunctiva/sclera: Conjunctivae normal.  Cardiovascular:     Rate and Rhythm: Regular rhythm. Tachycardia present.     Heart sounds: Normal heart sounds. No murmur heard.   Pulmonary:     Effort: Pulmonary effort is normal. No respiratory distress.     Breath sounds: Normal breath sounds.  Abdominal:     General: Abdomen is flat. Bowel sounds are normal. There is no distension.     Palpations: Abdomen is soft.     Tenderness: There is generalized abdominal tenderness.     Comments: Voluntary guarding present   Musculoskeletal:        General: Tenderness present.     Cervical back: Neck supple. No tenderness.     Right lower leg: Tenderness present. No edema.     Left lower leg: Tenderness present. No edema.     Comments: Erythematous nodules to  anterior b/l shins   Skin:    General: Skin is warm and dry.     Findings: Rash present.     Comments: Erythematous nodules to anterior b/l shins. TTP.   Neurological:     Mental Status: She is alert.  Psychiatric:        Mood and Affect: Mood is anxious. Affect is tearful.        Behavior: Behavior is cooperative.     ED Results / Procedures / Treatments   Labs (all labs ordered are listed, but only abnormal results are displayed) Labs Reviewed  CBC - Abnormal; Notable for the following components:      Result Value   WBC 17.6 (*)    Platelets 563 (*)    All other components within normal limits  BASIC METABOLIC PANEL - Abnormal; Notable for the following components:   Potassium 3.3 (*)    Glucose, Bld 137 (*)    All other components within normal limits  LACTIC ACID, PLASMA - Abnormal; Notable for the following components:   Lactic Acid, Venous 2.4 (*)    All other components within normal limits  LACTIC ACID, PLASMA    EKG None  Radiology No results found.  Procedures Procedures (including critical care time)  Medications Ordered in ED Medications   EPINEPHrine (EPI-PEN) 0.3 mg/0.3 mL injection (has no administration in time range)  hydrOXYzine (ATARAX/VISTARIL) tablet 10 mg (has no administration in time range)  diphenhydrAMINE (BENADRYL) injection 50 mg (50 mg Intravenous Given 07/22/19 2111)  lactated ringers bolus 1,000 mL (1,000 mLs Intravenous New Bag/Given 07/22/19 2111)    ED Course  I have reviewed the triage vital signs and the nursing notes.  Pertinent labs & imaging results that were available during my care of the patient were reviewed by me and considered in my medical decision making (see chart for details).    MDM Rules/Calculators/A&P                          Pt is a 24 yo F w hx of depression, anxiety, psoriasis, ulcerative colitis and Cdiff who presents with rash to b/l LE concerning for allergic reaction. Pt states she is currently taking vancomycin for c diff and is concerned that rash to legs is consistent with allergic reaction and SJS. On arrival pt afebrile, tachycardic to HR 100s however otherwise HDS. Pt is teary and anxious. On exam pt with erythematous nodules to anterior shins consistent with erythema nodosum. Shins TTP. Skin inspected wo evidence of wheels/hives.  Inspection of airway was not consistent with oropharyngeal swelling. Uvula not swollen, midline. No peeling of oral mucosa. Patient handling own secretions. Lungs clear to auscultation in all lung fields. Abdomen flat, non-distended, soft with generalized tenderness on mild palpation throughout. Voluntary guarding present that limits exam. Exam not consistent with anaphylaxis. In the setting of concern for allergic reaction 30m benadryl given IV. Initial screening labs completed and significant for leukocytosis to 17.6, increased lactic acid 2.4. BMP wo significant abnormalities. Of note patient recently received dose of steroids earlier in day for concern for allergic reaction and with known Cdiff infection leukocytosis could be explained. 1L LR bolus  given, as well as atarax 10 for anxiety.  On reassessment pt appeared more calm. Patient endorses some improvement in symptoms however still endorsed abdominal discomfort. Repeat lactic acid uptrending to 2.9. Will give 2nd L LR bolus. Discussed case with pharmacy in setting for reaction to vancomycin, recommended  switching abx to fidaxomycin which will be completed upon discharge. Discussed with patient that rash consistent with erythema nodosum that can be due to many factors including medications, viruses, fungal infxn, autoimmune diseases. Reassured patient that rash not consistent with anaphylaxis or SJS. However in the setting of increasing Lactic acid, leukocytosis and worsening abdominal pain in setting of known underlying UC/Cdiff will proceed with CT abdomen/pelvis. Patient care was handed off to oncoming team, please see their note for final disposition.   Final Clinical Impression(s) / ED Diagnoses Final diagnoses:  None    Rx / DC Orders ED Discharge Orders    None       Kennyth Lose, MD 07/23/19 2527    Elnora Morrison, MD 07/25/19 [phone]

## 2019-07-23 ENCOUNTER — Emergency Department (HOSPITAL_COMMUNITY): Payer: BC Managed Care – PPO

## 2019-07-23 ENCOUNTER — Telehealth: Payer: Self-pay | Admitting: Physician Assistant

## 2019-07-23 LAB — HEPATIC FUNCTION PANEL
ALT: 12 U/L (ref 0–44)
AST: 18 U/L (ref 15–41)
Albumin: 3.2 g/dL — ABNORMAL LOW (ref 3.5–5.0)
Alkaline Phosphatase: 68 U/L (ref 38–126)
Bilirubin, Direct: <0.1 mg/dL (ref 0.0–0.2)
Total Bilirubin: 0.3 mg/dL (ref 0.3–1.2)
Total Protein: 8.3 g/dL — ABNORMAL HIGH (ref 6.5–8.1)

## 2019-07-23 LAB — I-STAT BETA HCG BLOOD, ED (MC, WL, AP ONLY): I-stat hCG, quantitative: <5 m[IU]/mL (ref <5)

## 2019-07-23 MED ORDER — FENTANYL CITRATE (PF) 100 MCG/2ML IJ SOLN
50.0000 ug | Freq: Once | INTRAMUSCULAR | Status: AC
  Administered 2019-07-23: 50 ug via INTRAVENOUS
  Filled 2019-07-23: qty 2

## 2019-07-23 MED ORDER — FIDAXOMICIN 200 MG PO TABS: 200.0000 mg | ORAL_TABLET | Freq: Two times a day (BID) | ORAL | 0 refills | Status: DC

## 2019-07-23 MED ORDER — LACTATED RINGERS IV BOLUS
1000.0000 mL | Freq: Once | INTRAVENOUS | Status: AC
  Administered 2019-07-23: 1000 mL via INTRAVENOUS

## 2019-07-23 MED ORDER — IOHEXOL 300 MG/ML  SOLN
75.0000 mL | Freq: Once | INTRAMUSCULAR | Status: AC | PRN
  Administered 2019-07-23: 75 mL via INTRAVENOUS

## 2019-07-23 MED ORDER — FIDAXOMICIN 200 MG PO TABS
200.0000 mg | ORAL_TABLET | Freq: Once | ORAL | Status: AC
  Administered 2019-07-23: 200 mg via ORAL
  Filled 2019-07-23: qty 1

## 2019-07-23 NOTE — ED Notes (Signed)
RN called CT, pt is next to go to scans.

## 2019-07-23 NOTE — ED Notes (Signed)
Patient transported to CT 

## 2019-07-23 NOTE — ED Notes (Signed)
RN call main lab, they will add on hepatic function panel.

## 2019-07-23 NOTE — ED Provider Notes (Signed)
I assumed care of this patient.  Please see previous provider note for further details of Hx, PE.  Briefly patient is a 24 y.o. female who presented new rash while on Vanc for C.Diff.  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  Patient was having abdominal discomfort.  Labs revealed leukocytosis and elevated lactic acid.  Plan for CT scan to assess for any sequelae of C. Difficile.  If work-up was reassuring, patient was to be transitioned from vancomycin to fidaxomicin.  CT scan revealed resolving colitis.  Low suspicion for gallbladder thickening LFTs were reassuring.  Ultrasound without gallbladder wall thickening or evidence of acute cholecystitis.  Patient has been hemodynamically stable and was able to tolerate oral intake.  She was given her first dose of fidaxomicin here and given prescription.  Recommended she call her GI physician to discuss duration of new antibiotic course.  Will send note as well.  The patient appears reasonably screened and/or stabilized for discharge and I doubt any other medical condition or other [facility] requiring further screening, evaluation, or treatment in the ED at this time prior to discharge. Safe for discharge with strict return precautions.  Disposition: Discharge  Condition: Good  I have discussed the results, Dx and Tx plan with the patient/family who expressed understanding and agree(s) with the plan. Discharge instructions discussed at length. The patient/family was given strict return precautions who verbalized understanding of the instructions. No further questions at time of discharge.    ED Discharge Orders         Ordered    fidaxomicin (DIFICID) 200 MG TABS tablet  2 times daily     Discontinue  Reprint     07/23/19 0635            Follow Up: [place]  Call  to discuss duration of Fidaxomycin course.         Fatima Blank, MD 07/23/19 [phone]

## 2019-07-23 NOTE — ED Notes (Signed)
Discharge instructions reviewed with pt. Pt verbalized understanding.   

## 2019-07-23 NOTE — Telephone Encounter (Signed)
Left message on machine: went to ER on 7/4 for "dermatologial incident" and wants to know if she needs an appointment w/[facility] for "what she was seen for"

## 2019-07-23 NOTE — ED Notes (Signed)
RN called CT to notify that hCG resulted.

## 2019-07-23 NOTE — ED Notes (Signed)
Patient transported to Ultrasound 

## 2019-07-23 NOTE — Telephone Encounter (Signed)
Phone call to patient to inform her that per her ER note they wanted her to follow up with her Gastroenterologist and not Dermatologist.  Patient aware, patient states that her Gastroenterologist did have her stop the Cosentyx at this time.  I informed patient I would inform Jennifer Clark-Bruning.

## 2019-07-24 ENCOUNTER — Telehealth: Payer: Self-pay | Admitting: Gastroenterology

## 2019-07-24 DIAGNOSIS — A0472 Enterocolitis due to Clostridium difficile, not specified as recurrent: Secondary | ICD-10-CM

## 2019-07-24 DIAGNOSIS — E876 Hypokalemia: Secondary | ICD-10-CM

## 2019-07-24 NOTE — Telephone Encounter (Signed)
Would not expect the swelling to be related directly to C diff or electrolytes She needs to notify her PCP of the ankle swelling and discoloration

## 2019-07-24 NOTE — Telephone Encounter (Signed)
Spoke with patient she understands that she should complete the course of antibiotics once she picks it up from the pharmacy. She is concerned about her labs since the ER did not explain what any of it meant. Should I put an order to have labs redrawn?

## 2019-07-24 NOTE — Progress Notes (Signed)
This question has been addressed elsewhere in the patient's chart.

## 2019-07-24 NOTE — Telephone Encounter (Signed)
Patient has been advised and expressed understanding.

## 2019-07-24 NOTE — Telephone Encounter (Signed)
Thank you for your assistance.

## 2019-07-24 NOTE — Telephone Encounter (Signed)
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 difficile.  Her lactate was elevated likely due to inflammation in the colon from the C. difficile infection. Her potassium was slightly low due to diarrhea Would be reasonable to repeat a basic metabolic panel in 48 hours.  I would expect potassium to improve without therapy with improving diarrhea Please let me know if she has additional questions She should start the fidaxomicin today

## 2019-07-24 NOTE — Telephone Encounter (Signed)
I have advised Miss. Evelyn Goodwin to come by the office and have a BMET checked. I let her know that we want to make sure that her potassium stabilizes. She has advised that she has had continued swelling and discoloration in her ankles as well as pain. Would this be relative to her low potassium and/or C. Difficile?

## 2019-07-24 NOTE — Telephone Encounter (Signed)
Tried to contact patient with no answer, unable to leave voicemail due to box being full. I have reviewed ER visit she was given a 10 day course of the new antibiotic. I iwl add Vancomycin to her allergy list as well.

## 2019-07-26 ENCOUNTER — Other Ambulatory Visit (INDEPENDENT_AMBULATORY_CARE_PROVIDER_SITE_OTHER): Payer: BC Managed Care – PPO

## 2019-07-26 ENCOUNTER — Telehealth: Payer: Self-pay | Admitting: Gastroenterology

## 2019-07-26 DIAGNOSIS — E876 Hypokalemia: Secondary | ICD-10-CM

## 2019-07-26 DIAGNOSIS — A0472 Enterocolitis due to Clostridium difficile, not specified as recurrent: Secondary | ICD-10-CM

## 2019-07-26 LAB — BASIC METABOLIC PANEL
BUN: 18 mg/dL (ref 6–23)
CO2: 26 mEq/L (ref 19–32)
Calcium: 9.3 mg/dL (ref 8.4–10.5)
Chloride: 102 mEq/L (ref 96–112)
Creatinine, Ser: 0.63 mg/dL (ref 0.40–1.20)
GFR: 116.32 mL/min (ref >60.00)
Glucose, Bld: 103 mg/dL — ABNORMAL HIGH (ref 70–99)
Potassium: 3.8 mEq/L (ref 3.5–5.1)
Sodium: 138 mEq/L (ref 135–145)

## 2019-07-26 NOTE — Telephone Encounter (Signed)
I spoke with the pt and advised her that the letter was sent to her on 6/29.  She asked to have the letter emailed to her boss. I advised that we can not email however I can fax.  She provided the fax number and I faxed to [facility].  Pt aware

## 2019-07-26 NOTE — Telephone Encounter (Signed)
Patient calling states she needs a return to work note to be e-mail to her boss by today. Patient said she is actually on her way here to do labs please call her.

## 2019-07-31 ENCOUNTER — Telehealth: Payer: Self-pay | Admitting: Rheumatology

## 2019-07-31 ENCOUNTER — Telehealth: Payer: Self-pay | Admitting: *Deleted

## 2019-07-31 ENCOUNTER — Telehealth: Payer: Self-pay | Admitting: Gastroenterology

## 2019-07-31 NOTE — Progress Notes (Signed)
Office Visit Note  Patient: Evelyn Goodwin             Date of Birth: 1995/12/06           MRN: [id number]             PCP: Heywood Bene, PA-C Referring: Heywood Bene, * Visit Date: 08/13/2019 Occupation: @GUAROCC @  Subjective:  Bilateral leg pain   History of Present Illness: Evelyn Goodwin is a 24 y.o. female with history of psoriatic arthritis.  She is on cosentyx 300 mg sq injections every 30 days prescribed by her dermatologist.  Patient reports that her last dose of Cosentyx was in June and she has been holding it due to several new health concerns and a recent infection.  She reports that she was recently diagnosed with C. difficile colitis and was started on vancomycin.  Patient reports that after starting on vancomycin she developed a rash on bilateral lower extremities and increased pain in both legs. She states that she was having difficulty walking and was evaluated at urgent care.  She was told that the rash was likely due to either erythema nodosum or Stevens-Johnson syndrome.  According to the patient she had an IM steroid injection at urgent care and several minutes after the injection was unable to feel bilateral lower extremities.  She states that eventually she was able to go home and rest but continues to have persistent pain in both of her legs.  She was evaluated in the emergency department and was provided Benadryl.  According to the patient the rash has resolved but she continues to have bruising on both lower extremities.  She is currently having pain in both ankles, both knees, and both hips.  She states that all of the bones in her legs feel as though they are broken.     Activities of Daily Living:  Patient reports joint stiffness all day  Patient Reports nocturnal pain.  Difficulty dressing/grooming: Denies Difficulty climbing stairs: Denies Difficulty getting out of chair: Denies Difficulty using hands for taps, buttons, cutlery, and/or  writing: Denies  Review of Systems  Constitutional: Positive for fatigue.  HENT: Negative for mouth sores, mouth dryness and nose dryness.   Eyes: Negative for itching and dryness.  Respiratory: Negative for shortness of breath and difficulty breathing.   Cardiovascular: Positive for swelling in legs/feet. Negative for chest pain and palpitations.  Gastrointestinal: Negative for blood in stool, constipation and diarrhea.  Endocrine: Negative for increased urination.  Genitourinary: Negative for difficulty urinating.  Musculoskeletal: Positive for arthralgias, joint pain, joint swelling, myalgias, morning stiffness, muscle tenderness and myalgias.  Skin: Positive for rash and redness. Negative for color change.  Allergic/Immunologic: Negative for susceptible to infections.  Neurological: Positive for numbness and weakness. Negative for dizziness, headaches and memory loss.  Hematological: Positive for bruising/bleeding tendency.  Psychiatric/Behavioral: Negative for confusion.    PMFS History:  Patient Active Problem List   Diagnosis Date Noted  . C. difficile colitis 07/17/2019  . Diarrhea 06/13/2019  . RLQ abdominal pain 06/13/2019  . Acute perforated appendicitis 05/06/2019  . Ruptured appendicitis 04/30/2019  . Anxiety and depression 10/11/2018  . Other idiopathic scoliosis, lumbar region 10/11/2018  . History of ADHD 10/11/2018  . 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 Psoriatic arthritis ([place]) 10/11/2018    Past Medical History:  Diagnosis Date  . Anxiety   . Depression   . Psoriasis   . Scoliosis     Family History  Problem Relation  Age of Onset  . Psoriasis Mother   . Healthy Brother   . Colon cancer Neg Hx   . Esophageal cancer Neg Hx   . Pancreatic cancer Neg Hx   . Stomach cancer Neg Hx    Past Surgical History:  Procedure Laterality Date  . APPENDECTOMY    . LAPAROSCOPIC APPENDECTOMY N/A 05/07/2019   Procedure: APPENDECTOMY LAPAROSCOPIC;  Surgeon: Georganna Skeans, MD;  Location: [facility];  Service: General;  Laterality: N/A;  . WISDOM TOOTH EXTRACTION  2014   Social History   Social History Narrative  . Not on file   Immunization History  Administered Date(s) Administered  . Tdap 12/24/2018     Objective: Vital Signs: BP 124/82 (BP Location: Left Arm, Patient Position: Sitting, Cuff Size: Normal)   Pulse (!) 117   Resp 15   Ht 5' 7.75" (1.721 m)   Wt 115 lb (52.2 kg)   BMI 17.61 kg/m    Physical Exam Vitals and nursing note reviewed.  Constitutional:      Appearance: She is well-developed.  HENT:     Head: Normocephalic and atraumatic.  Eyes:     Conjunctiva/sclera: Conjunctivae normal.  Pulmonary:     Effort: Pulmonary effort is normal.  Abdominal:     General: Bowel sounds are normal.     Palpations: Abdomen is soft.  Musculoskeletal:     Cervical back: Normal range of motion.  Lymphadenopathy:     Cervical: No cervical adenopathy.  Skin:    General: Skin is warm and dry.     Capillary Refill: Capillary refill takes less than 2 seconds.  Neurological:     Mental Status: She is alert and oriented to person, place, and time.  Psychiatric:        Behavior: Behavior normal.      Musculoskeletal Exam: C-spine, thoracic spine, lumbar spine have good range of motion.  No midline spinal tenderness.  No tenderness over SI joints.  She has paraspinal muscle tenderness bilaterally.  Shoulder joints, elbow joints, wrist joints, MCPs, PIPs, DIPs have good range of motion with no synovitis.  She has complete fist formation bilaterally.  She has painful and slightly limited range of motion of the left hip joint on exam.  Warmth of both knee joints were noted.  She has good range of motion of both knees with discomfort.  Tenderness and inflammation in both ankle joints noted, left greater than right.  CDAI Exam: CDAI Score: -- Patient Global: --; Provider Global: -- Swollen: 4 ; Tender: 3  Joint Exam 08/13/2019      Right  Left   Hip      Tender  Knee  Swollen   Swollen   Ankle  Swollen Tender  Swollen Tender     Investigation: No additional findings.  Imaging: CT ABDOMEN PELVIS W CONTRAST  Result Date: 07/23/2019 CLINICAL DATA:  Worsening abdominal pain, leukocytosis, known clostridium difficile EXAM: CT ABDOMEN AND PELVIS WITH CONTRAST TECHNIQUE: Multidetector CT imaging of the abdomen and pelvis was performed using the standard protocol following bolus administration of intravenous contrast. CONTRAST:  33m OMNIPAQUE IOHEXOL 300 MG/ML  SOLN COMPARISON:  CT 06/13/2019 FINDINGS: Lower chest: Lung bases are clear. Normal heart size. No pericardial effusion. Hepatobiliary: Mild heterogeneous enhancement of the liver is likely related to contrast timing given early arterial phase of imaging. Worrisome liver lesions. Focal fatty infiltration seen along the falciform ligament. There is mild gallbladder wall thickening without pericholecystic fluid or inflammation. No visible calcified gallstones.  Normal biliary caliber. Pancreas: Unremarkable. No pancreatic ductal dilatation or surrounding inflammatory changes. Spleen: Normal in size without focal abnormality. Adrenals/Urinary Tract: Normal adrenal glands. Kidneys enhance symmetrically and uniformly. No concerning renal lesions. No urolithiasis or hydronephrosis. Urinary bladder is unremarkable for degree of distension. Stomach/Bowel: Distal esophagus, stomach and duodenal sweep are unremarkable. Fluid-filled loops of distal small bowel are nonspecific. No small bowel thickening or dilatation. Postsurgical changes seen adjacent the cecum, likely reflect prior appendectomy. Moderate colonic stool burden seen predominantly right-sided. Decreased colonic mural thickening with only mild residual thickening at the level of the rectosigmoid without extraluminal gas or fluid. No evidence of high-grade obstruction or ileus. Vascular/Lymphatic: Prominent parametrial vessels, similar to  prior. No other acute or significant vascular findings. The reactive appearing nodes in the mesentery. No pathologically enlarged abdominopelvic nodes. Reproductive: Anteverted uterus with prominent parametrial vessels. No concerning adnexal lesions. Other: No abdominopelvic free fluid or free gas. No bowel containing hernias. Musculoskeletal: No acute osseous abnormality or suspicious osseous lesion. Levocurvature of the thoracolumbar spine is similar to prior. IMPRESSION: 1. Nonspecific fluid-filled loops of small bowel with decreasing colonic mural thickening with only mild residual thickening at the level of the rectosigmoid without extraluminal gas or fluid. Findings are suggestive of a resolving colitis. 2. Moderate colonic stool burden seen predominantly right-sided. Correlate for slowed transit. No high-grade obstruction. 3. Prominent parametrial vessels, similar to prior, which can be seen with pelvic congestion syndrome in the appropriate clinical setting. 4. Mild gallbladder wall thickening without pericholecystic fluid or inflammation. No visible calcified gallstones. If there is clinical concern for acute cholecystitis, consider further evaluation with right upper quadrant ultrasound. Electronically Signed   By: Lovena Le M.D.   On: 07/23/2019 04:26   US Abdomen Limited RUQ  Result Date: 07/23/2019 CLINICAL DATA:  24 year old female with history of thickening of the gallbladder wall on CT examination. Follow-up study. EXAM: ULTRASOUND ABDOMEN LIMITED RIGHT UPPER QUADRANT COMPARISON:  CT the abdomen and pelvis 07/23/2019. FINDINGS: Gallbladder: No gallstones or wall thickening visualized. No sonographic Murphy sign noted by sonographer. Common bile duct: Diameter: 2.7 mm Liver: No focal lesion identified. Within normal limits in parenchymal echogenicity. Portal vein is patent on color Doppler imaging with normal direction of blood flow towards the liver. Other: None. IMPRESSION: 1. No  cholelithiasis or findings to suggest acute cholecystitis at this time. Gallbladder wall thickness is normal. Electronically Signed   By: Vinnie Langton M.D.   On: 07/23/2019 05:49    Recent Labs: Lab Results  Component Value Date   WBC 17.6 (H) 07/22/2019   HGB 13.0 07/22/2019   PLT 563 (H) 07/22/2019   NA 138 07/26/2019   K 3.8 07/26/2019   CL 102 07/26/2019   CO2 26 07/26/2019   GLUCOSE 103 (H) 07/26/2019   BUN 18 07/26/2019   CREATININE 0.63 07/26/2019   BILITOT 0.3 07/22/2019   ALKPHOS 68 07/22/2019   AST 18 07/22/2019   ALT 12 07/22/2019   PROT 8.3 (H) 07/22/2019   ALBUMIN 3.2 (L) 07/22/2019   CALCIUM 9.3 07/26/2019   GFRAA >60 07/22/2019    Speciality Comments: No specialty comments available.  Procedures:  No procedures performed Allergies: Penicillin g and Vancomycin   Assessment / Plan:     Visit Diagnoses: Psoriatic arthritis ([place]): She has tenderness and inflammation of both ankle joints, warmth of both knee joints, and discomfort with ROM of the left hip joint on exam.  It appears that her psoriatic arthritis is flaring.  She is  prescribed Cosentyx 300 mg sq injections once every 30 days by her dermatologist. Her last dose of Cosentyx was in June 2021. She was advised to hold cosentyx this month by her dermatologist due to a recent infection (c diff) and recently completing a course of vancomycin.  When she is cleared by GI she was advised to resume Cosentyx as prescribed.  We will send in a prednisone taper starting at 20 mg tapering by 5 mg every 4 days. She would like to follow up as needed.   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  She plans on continue to follow-up with her dermatologist every 6 months.  High risk medication use -Cosentyx 300 mg sq injections once every 30 days-prescribed by her dermatologist. She was encouraged to resume cosentyx if cleared by GI.  Pain in both hands: X-rays of both hands were  obtained on 10/11/2018, which were unremarkable.  She has no tenderness or synovitis on exam.  She is able to make a complete fist without difficulty.    Pain in both feet -X-rays of both feet were unremarkable on 10/11/2018.  She has tenderness to palpation and inflammation over both ankle joints, L>R. She will starting a prednisone taper starting at 20 mg tapering by 5 mg every 4 days.    Chronic midline low back pain without sciatica - She has thoracolumbar dextroscoliosis.   Chronic SI joint pain: She has no SI joint tenderness to palpation on exam.  Other idiopathic scoliosis, thoracolumbar region: Chronic pain.  Positive ANA (antinuclear antibody) - 03/30/18:ANA 1:40 NS, Hep b surface Ab-, Hep C ab-04/14/17: Sed rate 2, CRP<0.31/25/19: RF<14, CRP 0.2, sed rate 6, ANA 1:80 nucleolar.   Rash and other nonspecific skin eruption: Patient was evaluated in the emergency department on 07/22/2019 for a rash that developed on bilateral lower extremities while taking vancomycin for management of C. difficile colitis.  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  Due to the initial concern for Stevens-Johnson syndrome she was given Benadryl 50 mg IV infusion while at the ED.  She was also switched from vancomyocin to fidaxomicin.  CT revealed resolving colitis on 07/23/19.  Dr. Hilarie Fredrickson agreed with continuing the course of fidaxomicin. The nodules have resolved but she continues to have some areas of ecchymosis on both shins.   She continues to take her oral contraceptive pill as prescribed.  This was her first occurrence of possible EN.  She did not have a biopsy for confirmation.  She had a chest x-ray performed on 04/30/2019 which did not reveal any abnormality.  If she has any recurrence we will check an ACE level. She has persistent pain in bilateral lower extremities and feels that "every bone is broken in both legs."  On examination today, she has tenderness and inflammation of  both ankle joints (L>R) and warmth of both knee joints.  She is also having discomfort and limited range of motion of the left hip joint.  She will be given a prednisone taper starting at 20 mg tapering by 5 mg every 4 days.  Other medical conditions are listed as follows:  History of ADHD  Anxiety and depression  Family history of psoriasis in mother  Orders: No orders of the defined types were placed in this encounter.  Meds ordered this encounter  Medications  . predniSONE (DELTASONE) 5 MG tablet    Sig: Take 4 tablets by mouth daily x4 days, 3 tablets by  mouth daily x4 days, 2 tablets by mouth daily x4 days, 1 tablet by mouth daily x4 days.    Dispense:  40 tablet    Refill:  0    Face-to-face time spent with patient was 30 minutes. Greater than 50% of time was spent in counseling and coordination of care.  Follow-Up Instructions: Return if symptoms worsen or fail to improve, for Psoriatic arthritis.   Ofilia Neas, PA-C   I examined and evaluated the patient with Hazel Sams PA.  Patient appears to be having a flare of psoriatic arthritis.  She has several swollen joints.  She was given a prednisone taper.  She plans to continue care with her dermatologist.  She wants to return only on as needed basis.  We advised her to contact us in case she develops new lesions of erythema nodosum.  The plan of care was discussed as noted above.  Bo Merino, MD  Note - This record has been created using Editor, commissioning.  Chart creation errors have been sought, but may not always  have been located. Such creation errors do not reflect on  the standard of medical care.

## 2019-07-31 NOTE — Telephone Encounter (Signed)
I called and spoke with the patient.  She has been on the Dificid/off of the vancomycin for about a week at this point. She is feeling much better. Repeat BMP last week showed her potassium has normalized. Is having repeat CBC done today with her PCP. I will have my CMA reach out to her to schedule her for a return office visit with me in a few weeks at which time we will discuss and likely schedule colonoscopy to rule out possible diagnosis of underlying IBD/UC. FMLA paperwork was received and completed.

## 2019-07-31 NOTE — Telephone Encounter (Signed)
Patient called stating she had an appointment with Dr. Estanislado Pandy in September of 2020.  Patient states she is currently under the care of a gastroenterologist.  Patient states she has a couple of questions and does not want to schedule an appointment until she has spoken with the nurse first.  Please advise.

## 2019-07-31 NOTE — Telephone Encounter (Signed)
Patient returned your call.  Patient states she will keep phone with her.

## 2019-07-31 NOTE — Telephone Encounter (Signed)
Received FMLA paperwork from patient to be completed ASAP. Janett Billow completed forms. Patient informed forms faxed to job. Also informed patient that in the future these forms must be completed by [employer]. Gave patient [employer] information. If she needs anything else related to FMLA patient will call [employer].

## 2019-07-31 NOTE — Telephone Encounter (Signed)
Patient states she was seen in the emergency room on 07/22/2019. Patient states she red welps that, ankle swelling and pain in her legs. Patient states she was also was diagnosed with C. Diff as well. 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 Patient states she was advised to use ibuprofen, heat, ice, and elevation of her legs. Patient states that helps some. Patient was last seen in September 2020. Patient would like to know if she should follow up here. Patient states her dermatologist advised her that it was out of her scope. Please advise.

## 2019-07-31 NOTE — Telephone Encounter (Signed)
Attempted to contact patient and unable to leave message- voicemail box is full.

## 2019-07-31 NOTE — Telephone Encounter (Signed)
Per Dr. Estanislado Pandy patient will need to be evaluated in office. Patient offered multiple appointments and then has been scheduled for 08/13/2019.

## 2019-08-13 ENCOUNTER — Encounter: Payer: Self-pay | Admitting: Physician Assistant

## 2019-08-13 ENCOUNTER — Ambulatory Visit (INDEPENDENT_AMBULATORY_CARE_PROVIDER_SITE_OTHER): Payer: BC Managed Care – PPO | Admitting: Physician Assistant

## 2019-08-13 ENCOUNTER — Other Ambulatory Visit: Payer: Self-pay

## 2019-08-13 VITALS — BP 124/82 | HR 117 | Resp 15 | Ht 67.75 in | Wt 115.0 lb

## 2019-08-13 DIAGNOSIS — L4050 Arthropathic psoriasis, unspecified: Secondary | ICD-10-CM | POA: Diagnosis not present

## 2019-08-13 DIAGNOSIS — M545 Low back pain, unspecified: Secondary | ICD-10-CM

## 2019-08-13 DIAGNOSIS — F329 Major depressive disorder, single episode, unspecified: Secondary | ICD-10-CM

## 2019-08-13 DIAGNOSIS — G8929 Other chronic pain: Secondary | ICD-10-CM

## 2019-08-13 DIAGNOSIS — F32A Depression, unspecified: Secondary | ICD-10-CM

## 2019-08-13 DIAGNOSIS — Z840 Family history of diseases of the skin and subcutaneous tissue: Secondary | ICD-10-CM

## 2019-08-13 DIAGNOSIS — M79672 Pain in left foot: Secondary | ICD-10-CM

## 2019-08-13 DIAGNOSIS — Z79899 Other long term (current) drug therapy: Secondary | ICD-10-CM | POA: Diagnosis not present

## 2019-08-13 DIAGNOSIS — M79641 Pain in right hand: Secondary | ICD-10-CM | POA: Diagnosis not present

## 2019-08-13 DIAGNOSIS — Z8659 Personal history of other mental and behavioral disorders: Secondary | ICD-10-CM

## 2019-08-13 DIAGNOSIS — M79642 Pain in left hand: Secondary | ICD-10-CM

## 2019-08-13 DIAGNOSIS — R768 Other specified abnormal immunological findings in serum: Secondary | ICD-10-CM

## 2019-08-13 DIAGNOSIS — R21 Rash and other nonspecific skin eruption: Secondary | ICD-10-CM

## 2019-08-13 DIAGNOSIS — M4125 Other idiopathic scoliosis, thoracolumbar region: Secondary | ICD-10-CM

## 2019-08-13 DIAGNOSIS — M533 Sacrococcygeal disorders, not elsewhere classified: Secondary | ICD-10-CM

## 2019-08-13 DIAGNOSIS — M79671 Pain in right foot: Secondary | ICD-10-CM

## 2019-08-13 DIAGNOSIS — L404 Guttate psoriasis: Secondary | ICD-10-CM | POA: Diagnosis not present

## 2019-08-13 DIAGNOSIS — R7689 Other specified abnormal immunological findings in serum: Secondary | ICD-10-CM

## 2019-08-13 DIAGNOSIS — F419 Anxiety disorder, unspecified: Secondary | ICD-10-CM

## 2019-08-13 MED ORDER — PREDNISONE 5 MG PO TABS: ORAL_TABLET | ORAL | 0 refills | Status: DC

## 2019-08-16 ENCOUNTER — Telehealth: Payer: Self-pay | Admitting: Rheumatology

## 2019-08-16 NOTE — Telephone Encounter (Signed)
Patient called stating she requested the notes from her appointment on 08/13/19 to be faxed to her PCP [name].  Patient states they have not received the notes and requested they be faxed to [phone]     Patient is also requesting a return call after 9:00 am today, 08/16/19 to discuss her appointment.

## 2019-08-16 NOTE — Telephone Encounter (Signed)
Office note send to PCP.   Attempted to contact the patient and unable to leave a message, mailbox is full.

## 2019-08-21 ENCOUNTER — Telehealth: Payer: Self-pay | Admitting: Rheumatology

## 2019-08-21 NOTE — Telephone Encounter (Signed)
Last office note faxed as requested.

## 2019-08-21 NOTE — Telephone Encounter (Signed)
Patient calling to request last ov note to be faxed to [name], [facility] at her PCP office. PA would like to coordinate treatment.Phone# [phone]. [name]

## 2019-08-22 ENCOUNTER — Ambulatory Visit: Payer: BC Managed Care – PPO | Admitting: Gastroenterology

## 2019-08-22 NOTE — Telephone Encounter (Signed)
I returned patient's call.  Her mailbox was full and I could not leave a message.  I will try calling again tomorrow.  Please fax the last office note to her dermatologist and her PCP.  She is on treatment for psoriasis by her dermatologist.

## 2019-08-28 NOTE — Telephone Encounter (Signed)
I called patient.  She states she did not get prednisone taper and she will get it today.  Explained to her it will take some time for Cosentyx to work again as she was off the medication when she had a infection.  She states her swelling has improved.  She will see response to the steroid taper.  She will call us if she needs any further help.

## 2019-09-12 ENCOUNTER — Telehealth: Payer: Self-pay | Admitting: Rheumatology

## 2019-09-12 ENCOUNTER — Telehealth: Payer: Self-pay | Admitting: *Deleted

## 2019-09-12 NOTE — Telephone Encounter (Signed)
Anderson Malta from [facility] called stating since Dr. Estanislado Pandy is treating patient for psoriatic arthritis they are having a difficult time with insurance trying to approve her prescription of Cosentyx.  Anderson Malta states they would like Dr. Estanislado Pandy to take over her care and prescription of Cosentyx.  Jennifer's # [phone]

## 2019-09-12 NOTE — Telephone Encounter (Signed)
Per Lars Pinks Bruning referred patient to Rheumatoid/ Deveshwar is following patient for Psoriatic Arthritis but we are getting paperwork for Cosentyx.  Insurance is giving [place] a hard time so I called [facility]eshwar office to see about taking over Cosentyx approval because they are following/treating patient for Psoriatic Arthritis. I was told [facility]eshwar nurse will call me back.

## 2019-09-13 NOTE — Telephone Encounter (Signed)
It will be unlikely for [place] to get approval for Cosentyx if dermatology can get it.  Please advise him to try [facility] or some other options.

## 2019-09-13 NOTE — Telephone Encounter (Signed)
Lauren at [facility] advised will be unlikely for [place] to get approval for Cosentyx if dermatology can not get it. Advised for Anderson Malta to try Donnetta Hail or some other options. Lauren states if she is seeing [place] then there's not point in her seeing them. Advised as patient has psoriasis she should continue seeing them.

## 2019-09-19 ENCOUNTER — Telehealth: Payer: Self-pay | Admitting: *Deleted

## 2019-09-19 DIAGNOSIS — L4050 Arthropathic psoriasis, unspecified: Secondary | ICD-10-CM

## 2019-09-26 ENCOUNTER — Encounter: Payer: Self-pay | Admitting: Nurse Practitioner

## 2019-09-26 ENCOUNTER — Ambulatory Visit (INDEPENDENT_AMBULATORY_CARE_PROVIDER_SITE_OTHER): Payer: BC Managed Care – PPO | Admitting: Physician Assistant

## 2019-09-26 ENCOUNTER — Ambulatory Visit (INDEPENDENT_AMBULATORY_CARE_PROVIDER_SITE_OTHER): Payer: BC Managed Care – PPO | Admitting: Nurse Practitioner

## 2019-09-26 ENCOUNTER — Encounter: Payer: Self-pay | Admitting: Physician Assistant

## 2019-09-26 ENCOUNTER — Other Ambulatory Visit: Payer: Self-pay

## 2019-09-26 VITALS — BP 106/60 | HR 118 | Ht 67.75 in | Wt 122.0 lb

## 2019-09-26 DIAGNOSIS — L700 Acne vulgaris: Secondary | ICD-10-CM | POA: Diagnosis not present

## 2019-09-26 DIAGNOSIS — Z8619 Personal history of other infectious and parasitic diseases: Secondary | ICD-10-CM

## 2019-09-26 DIAGNOSIS — L409 Psoriasis, unspecified: Secondary | ICD-10-CM

## 2019-09-26 MED ORDER — BETAMETHASONE DIPROPIONATE 0.05 % EX CREA: TOPICAL_CREAM | Freq: Two times a day (BID) | CUTANEOUS | 6 refills | Status: DC | PRN

## 2019-09-26 MED ORDER — SUTAB 1479-225-188 MG PO TABS: 1.0000 | ORAL_TABLET | Freq: Once | ORAL | 0 refills | Status: AC

## 2019-09-26 MED ORDER — AMZEEQ 4 % EX FOAM: 1.0000 "application " | Freq: Every day | CUTANEOUS | 3 refills | Status: DC

## 2019-09-26 NOTE — Patient Instructions (Signed)
If you are age 24 or older, your body mass index should be between 23-30. Your Body mass index is 18.69 kg/m. If this is out of the aforementioned range listed, please consider follow up with your Primary Care Provider.  If you are age 55 or younger, your body mass index should be between 19-25. Your Body mass index is 18.69 kg/m. If this is out of the aformentioned range listed, please consider follow up with your Primary Care Provider.   You have been scheduled for a colonoscopy. Please follow written instructions given to you at your visit today.  Please pick up your prep supplies at the pharmacy within the next 1-3 days. If you use inhalers (even only as needed), please bring them with you on the day of your procedure.  Due to recent changes in healthcare laws, you may see the results of your imaging and laboratory studies on MyChart before your provider has had a chance to review them.  We understand that in some cases there may be results that are confusing or concerning to you. Not all laboratory results come back in the same time frame and the provider may be waiting for multiple results in order to interpret others.  Please give [place] 48 hours in order for your provider to thoroughly review all the results before contacting the office for clarification of your results.

## 2019-09-26 NOTE — Progress Notes (Signed)
ASSESSMENT AND PLAN    # C-diff infection, resolved.  --Diagnosed a few weeks following appendectomy for ruptured appendicitis in April.  --Tested positive for C-diff late May , responded to oral Vancomycin.  --Tested positive again late June after presenting with recurrent symptoms. Developed lower body rash with second course of vancomycin. She was changed to Dificid, completed course of treatment and no diarrhea since --There was question of whether patient needed a colonoscopy to rule out underlying IBD.  Since her bowel movements have completely returned to normal after treatment for C. difficile I doubt underlying IBD.  However, patient would like to proceed with colonoscopy anyway. Patient will be scheduled for a colonoscopy. The risks and benefits of colonoscopy with possible polypectomy / biopsies were discussed and the patient agrees to proceed.   HISTORY OF PRESENT ILLNESS     Primary Gastroenterologist : Thornton Park, MD  Chief Complaint : follow up on C-diff.   Evelyn Goodwin is a 24 y.o. female with PMH / [place] significant for,  but not necessarily limited to:  Had appendectomy 05/07/19 for perforated appendix.  Specimen path showed acute appendicitis with serositis, periappendiceal inflammation and abscess formation.   A few weeks following surgery patient be having diarrhea.  She had a CT scan that showed diffuse colonic wall thickening and inflammation throughout the colon to the rectum. Tested positive for C-diff on 5/27 and was treated with Vancomycin. Diarrhea resolved but recurred and she tested positive again on 6/25, . Seen in the office 07/17/19, prescribed a second round of Vancomyin. She subsequently developed a rash on lower body. She was seen in the ED for the rash. Repeat CT scan showed resolving coltis. Vancomycin changed to dificid. Patient was in touch with our office Janett Billow [place], [place]) who recommended follow up in office after Dificid. There was  mention that she may need a colonoscopy.  No family history of IBD  Interval History:  She completed Dificid and no diarrhea since. She says her bowel movements have since been formed. She is having 2-3 BMs a day, baseline being 1-2 a day. She started Effexor last night and had loose stool ( not diarrhea) this am,  just as PCP said she may.   Data Reviewed  05/06/19 CT scan  IMPRESSION: Redemonstration of appendicitis with enlargement of periappendiceal phlegmon. Increase in pelvic fluid with peritoneal enhancement, likely due to infected ascites.  05/25/19 CT scan IMPRESSION: 1. Complete resolution of the inflammatory process in the right lower quadrant with no residual abdominal abscess. 2. Edema of the mucosa of the sigmoid portion of the colon and in the rectum which could represent colitis, possibly due to antibiotic therapy. 3. Prominent appendiceal stump.  06/13/19 CT scan  IMPRESSION: 1. The colon is diffusely thickened and inflamed appearing throughout its length to the rectum, significantly worsened compared to prior examination. The sigmoid colon and rectum are generally featureless appearing. Findings are consistent with nonspecific infectious or inflammatory colitis, although this appearance and distribution is concerning for ulcerative colitis. 2. Trace free fluid in the low pelvis, likely reactive. 3. No evidence of intra-abdominal fluid collection given history of recent appendectomy  07/23/19 CT scan IMPRESSION: 1. Nonspecific fluid-filled loops of small bowel with decreasing colonic mural thickening with only mild residual thickening at the level of the rectosigmoid without extraluminal gas or fluid. Findings are suggestive of a resolving colitis. 2. Moderate colonic stool burden seen predominantly right-sided. Correlate for slowed transit. No high-grade obstruction. 3. Prominent  parametrial vessels, similar to prior, which can be seen with pelvic congestion  syndrome in the appropriate clinical setting. 4. Mild gallbladder wall thickening without pericholecystic fluid or inflammation. No visible calcified gallstones. If there is clinical concern for acute cholecystitis, consider further evaluation with right upper quadrant ultrasound   APPENDIX, APPENDECTOMY:  - Acute appendicitis with serositis, periappendiceal inflammation and  abscess formation   Past Medical History:  Diagnosis Date  . Anxiety   . Clostridium difficile infection   . Depression   . Psoriasis   . Scoliosis     Current Medications, Allergies, Past Surgical History, Family History and Social History were reviewed in [employer] record.   Review of Systems: No chest pain. No shortness of breath. No urinary complaints.   PHYSICAL EXAM :    Wt Readings from Last 3 Encounters:  09/26/19 122 lb (55.3 kg)  08/13/19 115 lb (52.2 kg)  07/17/19 105 lb (47.6 kg)    BP 106/60   Pulse (!) 118   Ht 5' 7.75" (1.721 m)   Wt 122 lb (55.3 kg)   BMI 18.69 kg/m  Constitutional:  Pleasant  female in no acute distress. Psychiatric: Normal mood and affect. Behavior is normal. EENT: Pupils normal.  Conjunctivae are normal. No scleral icterus. Neck supple.  Cardiovascular: Normal rate, regular rhythm. No edema Pulmonary/chest: Effort normal and breath sounds normal. No wheezing, rales or rhonchi. Abdominal: Soft, nondistended, nontender. Bowel sounds active throughout. There are no masses palpable. No hepatomegaly. Neurological: Alert and oriented to person place and time. Skin: Skin is warm and dry. No rashes noted.  Tye Savoy, NP  09/26/2019, 2:36 PM

## 2019-09-26 NOTE — Progress Notes (Signed)
   Follow up Visit  Subjective  Evelyn Goodwin is a 24 y.o. female who presents for the following: Acne (face only- tx -none). Acne since wearing masks. Skin is middle. She feels like mask is aggravating the problem. Behind her ears is also still a problem with redness and crusting. Otherwise her psoriasis is quite clear. Her toes have been calm from the dactylitis.   Objective  Well appearing patient in no apparent distress; mood and affect are within normal limits.  A focused examination was performed including extremities, including the arms, hands, fingers, and fingernails and the legs, feet, toes, and toenails and face, neck. Relevant physical exam findings are noted in the Assessment and Plan.   Objective  Right Inferior Helix: Well-marginated erythematous papules/plaques with silvery scale.   Objective  Left Buccal Cheek , Right Buccal Cheek : Erythematous papules and pustules with comedones   Assessment & Plan  Psoriasis Right Inferior Helix  We discussed this being psoriasis with secondary impetigo. Mask elastic is not helping the situation. She says she wears a clip to try to keep the elastic off the back of her ears. She will do steroid during the week and mupirocin on the weekend. She will continue her Cosentyx.   Ordered Medications: betamethasone dipropionate 0.05 % cream  Acne vulgaris (2) Left Buccal Cheek ; Right Buccal Cheek   Ordered Medications: Minocycline HCl Micronized (AMZEEQ) 4 % FOAM

## 2019-09-26 NOTE — Progress Notes (Signed)
Reviewed and agree with management plans. ? ?Theodore Virgin L. Jeanpierre Thebeau, MD, MPH  ?

## 2019-10-03 ENCOUNTER — Encounter: Payer: Self-pay | Admitting: Gastroenterology

## 2019-10-10 ENCOUNTER — Other Ambulatory Visit: Payer: Self-pay

## 2019-10-10 ENCOUNTER — Encounter: Payer: Self-pay | Admitting: Gastroenterology

## 2019-10-10 ENCOUNTER — Ambulatory Visit (AMBULATORY_SURGERY_CENTER): Payer: BC Managed Care – PPO | Admitting: Gastroenterology

## 2019-10-10 VITALS — BP 128/91 | HR 79 | Temp 97.7°F | Resp 12 | Ht 67.0 in | Wt 122.0 lb

## 2019-10-10 DIAGNOSIS — R933 Abnormal findings on diagnostic imaging of other parts of digestive tract: Secondary | ICD-10-CM

## 2019-10-10 DIAGNOSIS — K5289 Other specified noninfective gastroenteritis and colitis: Secondary | ICD-10-CM | POA: Diagnosis not present

## 2019-10-10 DIAGNOSIS — K529 Noninfective gastroenteritis and colitis, unspecified: Secondary | ICD-10-CM | POA: Diagnosis not present

## 2019-10-10 DIAGNOSIS — K6289 Other specified diseases of anus and rectum: Secondary | ICD-10-CM

## 2019-10-10 DIAGNOSIS — Z8619 Personal history of other infectious and parasitic diseases: Secondary | ICD-10-CM | POA: Diagnosis not present

## 2019-10-10 DIAGNOSIS — K5150 Left sided colitis without complications: Secondary | ICD-10-CM | POA: Diagnosis not present

## 2019-10-10 DIAGNOSIS — O09292 Supervision of pregnancy with other poor reproductive or obstetric history, second trimester: Secondary | ICD-10-CM

## 2019-10-10 DIAGNOSIS — K51919 Ulcerative colitis, unspecified with unspecified complications: Secondary | ICD-10-CM

## 2019-10-10 DIAGNOSIS — K5100 Ulcerative (chronic) pancolitis without complications: Secondary | ICD-10-CM

## 2019-10-10 MED ORDER — SODIUM CHLORIDE 0.9 % IV SOLN: 500.0000 mL | Freq: Once | INTRAVENOUS | Status: DC

## 2019-10-10 NOTE — Progress Notes (Signed)
Called to room to assist during endoscopic procedure.  Patient ID and intended procedure confirmed with present staff. Received instructions for my participation in the procedure from the performing physician.  

## 2019-10-10 NOTE — Patient Instructions (Signed)
Resume previous diet  continue present medications No aspirin , ibuprofen, naproxen, or other NSAIDS Await pathology results   YOU HAD AN ENDOSCOPIC PROCEDURE TODAY AT [facility]:   Refer to the procedure report that was given to you for any specific questions about what was found during the examination.  If the procedure report does not answer your questions, please call your gastroenterologist to clarify.  If you requested that your care partner not be given the details of your procedure findings, then the procedure report has been included in a sealed envelope for you to review at your convenience later.  YOU SHOULD EXPECT: Some feelings of bloating in the abdomen. Passage of more gas than usual.  Walking can help get rid of the air that was put into your GI tract during the procedure and reduce the bloating. If you had a lower endoscopy (such as a colonoscopy or flexible sigmoidoscopy) you may notice spotting of blood in your stool or on the toilet paper. If you underwent a bowel prep for your procedure, you may not have a normal bowel movement for a few days.  Please Note:  You might notice some irritation and congestion in your nose or some drainage.  This is from the oxygen used during your procedure.  There is no need for concern and it should clear up in a day or so.  SYMPTOMS TO REPORT IMMEDIATELY:   Following lower endoscopy (colonoscopy or flexible sigmoidoscopy):  Excessive amounts of blood in the stool  Significant tenderness or worsening of abdominal pains  Swelling of the abdomen that is new, acute  Fever of 100F or higher   For urgent or emergent issues, a gastroenterologist can be reached at any hour by calling [phone]. Do not use MyChart messaging for urgent concerns.    DIET:  We do recommend a small meal at first, but then you may proceed to your regular diet.  Drink plenty of fluids but you should avoid alcoholic beverages for 24  hours.  ACTIVITY:  You should plan to take it easy for the rest of today and you should NOT DRIVE or use heavy machinery until tomorrow (because of the sedation medicines used during the test).    FOLLOW UP: Our staff will call the number listed on your records 48-72 hours following your procedure to check on you and address any questions or concerns that you may have regarding the information given to you following your procedure. If we do not reach you, we will leave a message.  We will attempt to reach you two times.  During this call, we will ask if you have developed any symptoms of COVID 19. If you develop any symptoms (ie: fever, flu-like symptoms, shortness of breath, cough etc.) before then, please call [phone].  If you test positive for Covid 19 in the 2 weeks post procedure, please call and report this information to [place].    If any biopsies were taken you will be contacted by phone or by letter within the next 1-3 weeks.  Please call us at [phone] if you have not heard about the biopsies in 3 weeks.    SIGNATURES/CONFIDENTIALITY: You and/or your care partner have signed paperwork which will be entered into your electronic medical record.  These signatures attest to the fact that that the information above on your After Visit Summary has been reviewed and is understood.  Full responsibility of the confidentiality of this discharge information lies with you and/or  your care-partner.

## 2019-10-10 NOTE — Progress Notes (Signed)
To PACU, VSS. Report to [email or web address] 

## 2019-10-10 NOTE — Op Note (Signed)
[facility] Patient Name: Evelyn Goodwin Procedure Date: 10/10/2019 3:02 PM MRN: [id number] Endoscopist: Thornton Park MD, MD Age: 24 Referring MD:  Date of Birth: 01/07/1996 Gender: Female Account #: [id number] Procedure:                Colonoscopy Indications:              Abnormal CT of the GI tract                           Had appendectomy 05/07/19 for perforated appendix.                            Specimen path showed acute appendicitis with                            serositis, periappendiceal inflammation and abscess                            formation.                           A few weeks following surgery patient be having                            diarrhea. She had a CT scan that showed diffuse                            colonic wall thickening and inflammation throughout                            the colon to the rectum. Tested positive for C-diff                            on 5/27 and was treated with Vancomycin. Diarrhea                            resolved but recurred and she tested positive again                            on 6/25, . Seen in the office 07/17/19, prescribed a                            second round of Vancomyin. She subsequently                            developed a rash on lower body. She was seen in the                            ED for the rash. Repeat CT scan showed resolving                            coltis. Vancomycin changed to dificid. Bowel habits  have returned to normal but there is concern for                            possible IBD. No family history of IBD Medicines:                Monitored Anesthesia Care Procedure:                Pre-Anesthesia Assessment:                           - Prior to the procedure, a History and Physical                            was performed, and patient medications and                            allergies were reviewed. The patient's tolerance of                             previous anesthesia was also reviewed. The risks                            and benefits of the procedure and the sedation                            options and risks were discussed with the patient.                            All questions were answered, and informed consent                            was obtained. Prior Anticoagulants: The patient has                            taken no previous anticoagulant or antiplatelet                            agents. ASA Grade Assessment: II - A patient with                            mild systemic disease. After reviewing the risks                            and benefits, the patient was deemed in                            satisfactory condition to undergo the procedure.                           After obtaining informed consent, the colonoscope                            was passed under direct vision. Throughout the                              procedure, the patient's blood pressure, pulse, and                            oxygen saturations were monitored continuously. The                            Colonoscope was introduced through the anus and                            advanced to the 5 cm into the ileum. A second                            forward view of the right colon was performed. The                            colonoscopy was performed without difficulty. The                            patient tolerated the procedure well. The quality                            of the bowel preparation was good. The ileocecal                            valve, appendiceal orifice, and rectum were                            photographed. Scope In: 3:23:00 PM Scope Out: 3:37:08 PM Scope Withdrawal Time: 0 hours 9 minutes 36 seconds  Total Procedure Duration: 0 hours 14 minutes 8 seconds  Findings:                 The perianal and digital rectal examinations were                            normal.                           A diffuse area  of mildly ulcerated mucosa was found                            in the entire colon from the rectal verge to the                            terminal ileum. Biopsies were taken throughout the                            colon with a cold forceps for histology. Estimated                            blood loss was minimal.                           The terminal ileum appeared normal. Biopsies were  taken with a cold forceps for histology. Estimated                            blood loss was minimal.                           The exam was otherwise without abnormality on                            direct and retroflexion views. Complications:            No immediate complications. Estimated blood loss:                            Minimal. Estimated Blood Loss:     Estimated blood loss was minimal. Impression:               - Ulcerated mucosa in the entire examined colon. No                            rectal sparing. Biopsied.                           - The examined portion of the ileum was normal.                            Biopsied. Recommendation:           - Patient has a contact number available for                            emergencies. The signs and symptoms of potential                            delayed complications were discussed with the                            patient. Return to normal activities tomorrow.                            Written discharge instructions were provided to the                            patient.                           - Resume previous diet.                           - Continue present medications.                           - No aspirin, ibuprofen, naproxen, or other                            non-steroidal anti-inflammatory drugs.                           -   CRP, ESR, fecal calprotectin.                           - Await pathology results.                           - Follow-up in the office with Dr.  or Paula                             Guenther to review these results.   MD, MD 10/10/2019 3:49:41 PM This report has been signed electronically. 

## 2019-10-12 ENCOUNTER — Telehealth: Payer: Self-pay

## 2019-10-12 NOTE — Telephone Encounter (Signed)
°  Follow up Call-  Call back number 10/10/2019  Post procedure Call Back phone  # [phone]  Permission to leave phone message Yes  Some recent data might be hidden     Patient questions:  Do you have a fever, pain , or abdominal swelling? No. Pain Score  0 *  Have you tolerated food without any problems? Yes.    Have you been able to return to your normal activities? Yes.    Do you have any questions about your discharge instructions: Diet   No. Medications  No. Follow up visit  No.  Do you have questions or concerns about your Care? No.  Actions: * If pain score is 4 or above: No action needed, pain <4.  1. Have you developed a fever since your procedure? no  2.   Have you had an respiratory symptoms (SOB or cough) since your procedure? no  3.   Have you tested positive for COVID 19 since your procedure no  4.   Have you had any family members/close contacts diagnosed with the COVID 19 since your procedure?  no   If yes to any of these questions please route to Joylene John, RN and Joella Prince, RN

## 2019-10-18 ENCOUNTER — Telehealth: Payer: Self-pay | Admitting: Gastroenterology

## 2019-10-18 NOTE — Telephone Encounter (Signed)
Requesting path results

## 2019-10-19 NOTE — Telephone Encounter (Signed)
Pt notified via mychart, she already has ov scheduled.

## 2019-10-19 NOTE — Telephone Encounter (Signed)
Today is my first day back after 8 days of vacation. I see that she has called twice for these results. The biopsies of the small intestines appear normal. The biopsies from the colon show some acute inflammation. There are no findings of C diff or inflammatory bowel disease. If she is having ongoing GI symptoms, I recommend a GI pathogen panel for further investigation. Please offer her follow-up appointment with Nevin Bloodgood or myself, as well. Thanks.

## 2019-10-19 NOTE — Telephone Encounter (Signed)
Pt called again inquiring about path results. She is aware that we will cal her when they are ready.

## 2019-10-30 NOTE — Telephone Encounter (Signed)
Spoke with senderra and the patient is receiving free drug cosentyx due to insurance denial.

## 2019-11-08 ENCOUNTER — Ambulatory Visit: Payer: BC Managed Care – PPO | Admitting: Physician Assistant

## 2019-11-19 ENCOUNTER — Ambulatory Visit: Payer: BC Managed Care – PPO | Admitting: Physician Assistant

## 2019-11-20 NOTE — Telephone Encounter (Signed)
Patient called states she currently has no insurance and confirmed that she is doing well at this time no GI symptoms

## 2019-11-28 MED ORDER — COSENTYX (300 MG DOSE) 150 MG/ML ~~LOC~~ SOSY: 300.0000 mg | PREFILLED_SYRINGE | SUBCUTANEOUS | 9 refills | Status: DC

## 2019-11-28 NOTE — Addendum Note (Signed)
Addended by: Sheran Lawless on: 11/28/2019 11:05 AM   Modules accepted: Orders

## 2019-12-07 ENCOUNTER — Ambulatory Visit: Payer: BC Managed Care – PPO | Admitting: Physician Assistant

## 2019-12-17 ENCOUNTER — Telehealth: Payer: Self-pay

## 2019-12-17 NOTE — Telephone Encounter (Signed)
Fax received from [facility] stating they have tired contacting the patient and their now going to discontinue her prescription for her Cosentyx.

## 2019-12-26 ENCOUNTER — Telehealth: Payer: Self-pay | Admitting: Nurse Practitioner

## 2019-12-26 NOTE — Telephone Encounter (Signed)
Last seen by Dr Tarri Glenn. I called the patient back. Tentatively scheduled for 01/01/20. She is eating BRAT diet and trying to maintain hydration. She feels a little bit of cramping. Stools are on average of 4 a day and loose. She stresses she does not feel like she did when she had C Diff. Afebrile. No nausea or vomiting. No bloody stools.

## 2019-12-26 NOTE — Telephone Encounter (Signed)
Pt called stating that she has been experiencing diarrhea since yesterday. She stated that she has followed all the recommendations given but has seen no improvements and now everything seems to trigger her sxs. Pls call her.

## 2019-12-27 ENCOUNTER — Other Ambulatory Visit: Payer: Self-pay

## 2019-12-27 DIAGNOSIS — K51919 Ulcerative colitis, unspecified with unspecified complications: Secondary | ICD-10-CM

## 2019-12-27 MED ORDER — DICYCLOMINE HCL 20 MG PO TABS: 20.0000 mg | ORAL_TABLET | Freq: Four times a day (QID) | ORAL | 1 refills | Status: DC

## 2019-12-27 NOTE — Telephone Encounter (Signed)
See note below where Beth spoke with the pt last evening. She is scheduled to see Dr. Tarri Glenn 01/01/20.

## 2019-12-27 NOTE — Telephone Encounter (Signed)
I would like for her to have the labs recommended at the time of her last message (see orders in Epic). Add a GI pathogen panel.   Trial of dicyclomine 20 mg po QID.   Drink a lot of liquids that have water, salt, and sugar. Good choices are water mixed with juice, flavored soda, and soup broth. If you are drinking enough, your urine will be light yellow or almost clear.  Try to eat a little food. Good choices are potatoes, noodles, rice, oatmeal, crackers, bananas, soup, and boiled vegetables.  Avoid high fat foods, as they can make diarrhea worse.   Dairy products (except yogurt) may be difficult to digest when you have diarrhea. I recommend that you temporarily avoid lactose-containing foods.   I recommend a trial of loperamide 4 mg initially, then 2 mg after each unformed stool for ?2 days, with a maximum of 16 mg/day.  If that is not working, you could try bismuth salicylate (Pepto-Bismol) 30 mL or two tablets every 30 minutes for eight doses. Pepto-Bismol may make your stools black.  A daily probiotic may be very helpful.  We will be able to discuss further during 01/01/20 office visit. Thanks.

## 2019-12-27 NOTE — Telephone Encounter (Signed)
Order in for labs, script sent to pharmacy. Pt aware.

## 2019-12-28 NOTE — Telephone Encounter (Signed)
Reviewed My Chart communication and response from Dr. Tarri Glenn that occurred 12/27/19. Appears pt DID read My Chart message. Wanted to ensure pt understood recommendations and to remind to complete labs before 01/01/20 appt. Called pt but was not able to reach nor leave VM d/t mailbox being full. Courtesy and reminder My Chart message sent.

## 2020-01-01 ENCOUNTER — Other Ambulatory Visit (INDEPENDENT_AMBULATORY_CARE_PROVIDER_SITE_OTHER): Payer: PRIVATE HEALTH INSURANCE

## 2020-01-01 ENCOUNTER — Ambulatory Visit (INDEPENDENT_AMBULATORY_CARE_PROVIDER_SITE_OTHER): Payer: PRIVATE HEALTH INSURANCE | Admitting: Gastroenterology

## 2020-01-01 ENCOUNTER — Encounter: Payer: Self-pay | Admitting: Gastroenterology

## 2020-01-01 VITALS — BP 120/80 | HR 84 | Ht 66.54 in | Wt 128.0 lb

## 2020-01-01 DIAGNOSIS — R197 Diarrhea, unspecified: Secondary | ICD-10-CM | POA: Diagnosis not present

## 2020-01-01 DIAGNOSIS — K51919 Ulcerative colitis, unspecified with unspecified complications: Secondary | ICD-10-CM

## 2020-01-01 DIAGNOSIS — R109 Unspecified abdominal pain: Secondary | ICD-10-CM | POA: Diagnosis not present

## 2020-01-01 LAB — SEDIMENTATION RATE: Sed Rate: 33 mm/hr — ABNORMAL HIGH (ref 0–20)

## 2020-01-01 LAB — HIGH SENSITIVITY CRP: CRP, High Sensitivity: 4.23 mg/L (ref 0.000–5.000)

## 2020-01-01 NOTE — Patient Instructions (Addendum)
I have recommended some labs to evaluate for inflammation.  Please start taking your dicyclomine prior to meals. This should help with your symptoms that are occurring with eating.  Please call or MyChart me or Janett Billow if you are not feeling better by the January. 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   LABS: Your provider has requested that you go to the basement level for lab work before leaving today. Press "B" on the elevator. The lab is located at the first door on the left as you exit the elevator.  HEALTHCARE LAWS AND MY CHART RESULTS: Due to recent changes in healthcare laws, you may see the results of your imaging and laboratory studies on MyChart before your provider has had a chance to review them.  We understand that in some cases there may be results that are confusing or concerning to you. Not all laboratory results come back in the same time frame and the provider may be waiting for multiple results in order to interpret others.  Please give [place] 48 hours in order for your provider to thoroughly review all the results before contacting the office for clarification of your results.   If you are age 52 or younger, your body mass index should be between 19-25. Your Body mass index is 20.33 kg/m. If this is out of the aformentioned range listed, please consider follow up with your Primary Care Provider.   Thank you for trusting me with your gastrointestinal care!    Thornton Park, MD, MPH

## 2020-01-01 NOTE — Progress Notes (Addendum)
Referring Provider: Heywood Bene, * Primary Care Physician:  Heywood Bene, PA-C  Chief complaint:  Abdominal pain   IMPRESSION:  Abdominal pain Diarrhea with acute colitis on colon biopsies  History of C diff Appendectomy for severe appendicitis 05/07/19  Psoriatic arthritis on cosentyx  Differential includes: adhesions, post-infectious IBS, or infectious colitis. No features of chronic colitis on recent colon biopsies, however, ulcerative colitis should still be considered.   Phone call from dermatologist, Dr. Darrick Meigs 04/21/20 re: biologic treatment. He feels skin is well controlled. He was recommending a possible switch from Cosentyx due to gut health. Considering Humira or Stelara. However, the doses for treatment are lower than what would be needed for IBD. We will reach out to the patient to schedule office follow-up.  PLAN: GI pathogen panel, CRP, ESR, fecal calprotectin Dicylcomine 20 mg po QID If no improving, trial of Xifaxan 550 mg TID x 14 days Follow-up in 4-6 week with Dr. Tarri Glenn or Janett Billow  Please see the "Patient Instructions" section for addition details about the plan.  HPI: Evelyn Goodwin is a 24 y.o. female who returns in scheduled follow-up. This is my first office visit with Evelyn Goodwin.  She has acne, guttate psoriasis, and psoriatic arthritis and has been on cosentyx for one year after achieving remission with methotrexate (treated by [facility]).   As a brief summary, she had ruptured appendicitis in April. She was initially treated with antibiotics, but abscess got larger and she subsequently underwent surgery on April 19.  She had significant postoperative abdominal pain and diarrhea. Diagnosed with C. Difficile.  CT scan at the time showed pancolonic wall thickening and inflammation to the rectum.  She was treated with vancomycin 125 mg 4 times daily for 2 weeks.  Her symptoms only temporarily improved. C. difficile was  positive again on June 25 and had ongoing diffuse abdominal pain and diarrhea, including nocturnal symptoms.    Colonoscopy 10/10/19 - Ulcerated mucosa in the entire examined colon. No rectal sparing. Biopsied. - The examined portion of the ileum was normal. Biopsied.  Ileal biopsies were normal. Colon biopsies showed a mildly active nonspecific colitis. The biopsies show mild edema, lymphoplasmacytosis and focal cryptitis. Features of chronicity were not seen. The pathologist noted that the differential diagnosis mainly includes infection and drug-effect.  Today she returns in scheduled follow-up. Bowel habits have returned to normal. She is have 1-2 formed bowel movements daily. Migrating abdominal pain - midabdomen to the area of the appendix to the LLQ pain that is primarily postprandial. Not associated with stress.  Pain similar to pre appendectomy symptoms.   Trying to identify foods that minimize her symptoms.  No change with eliminating lactose. Trying to reduce caffeine.    Past Medical History:  Diagnosis Date  . Anxiety   . Clostridium difficile infection   . Depression   . Psoriasis   . Scoliosis     Past Surgical History:  Procedure Laterality Date  . LAPAROSCOPIC APPENDECTOMY N/A 05/07/2019   Procedure: APPENDECTOMY LAPAROSCOPIC;  Surgeon: Georganna Skeans, MD;  Location: [place];  Service: General;  Laterality: N/A;  . MOUTH SURGERY     24 year old molers removed  . WISDOM TOOTH EXTRACTION  2014    Current Outpatient Medications  Medication Sig Dispense Refill  . amitriptyline (ELAVIL) 25 MG tablet Take 25 mg by mouth at bedtime.    Marland Kitchen amphetamine-dextroamphetamine (ADDERALL XR) 20 MG 24 hr capsule Take 20 mg by mouth daily as needed (to  focus on work days).     . betamethasone dipropionate 0.05 % cream Apply topically 2 (two) times daily as needed (Rash). 45 g 6  . cyclobenzaprine (FLEXERIL) 5 MG tablet Take 5 mg by mouth at bedtime as needed for muscle spasms.     Marland Kitchen  ibuprofen (ADVIL) 200 MG tablet Take 600 mg by mouth every 6 (six) hours as needed for headache (pain).    . Minocycline HCl Micronized (AMZEEQ) 4 % FOAM Apply 1 application topically daily. 30 g 3  . norethindrone-ethinyl estradiol (LOESTRIN) 1-20 MG-MCG tablet Take 1 tablet by mouth at bedtime.     . ondansetron (ZOFRAN ODT) 4 MG disintegrating tablet Take 1 tablet (4 mg total) by mouth every 6 (six) hours as needed. (Patient taking differently: Take 4 mg by mouth every 6 (six) hours as needed for nausea or vomiting.) 20 tablet 0  . predniSONE (DELTASONE) 5 MG tablet Take 4 tablets by mouth daily x4 days, 3 tablets by mouth daily x4 days, 2 tablets by mouth daily x4 days, 1 tablet by mouth daily x4 days. 40 tablet 0  . saccharomyces boulardii (FLORASTOR) 250 MG capsule Take 1 capsule (250 mg total) by mouth 2 (two) times daily. (Patient taking differently: Take 250 mg by mouth daily.) 60 capsule 3  . Secukinumab, 300 MG Dose, (COSENTYX, 300 MG DOSE,) 150 MG/ML SOSY Inject 300 mg into the skin every 30 (thirty) days. 1.96 mL 9  . dicyclomine (BENTYL) 20 MG tablet TAKE 1 TABLET BY MOUTH EVERY 6 HOURS 360 tablet 1   No current facility-administered medications for this visit.    Allergies as of 01/01/2020 - Review Complete 01/01/2020  Allergen Reaction Noted  . Penicillin g Rash 05/10/2016  . Vancomycin Rash 07/24/2019    Family History  Problem Relation Age of Onset  . Psoriasis Mother   . Healthy Brother   . Colon cancer Neg Hx   . Esophageal cancer Neg Hx   . Pancreatic cancer Neg Hx   . Stomach cancer Neg Hx     Social History   Socioeconomic History  . Marital status: Single    Spouse name: Not on file  . Number of children: 0  . Years of education: Not on file  . Highest education level: Not on file  Occupational History  . 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 Smoking status: Never Smoker  . Smokeless tobacco: Never Used  Vaping  Use  . Vaping Use: Never used  Substance and Sexual Activity  . Alcohol use: Yes    Comment: Rare  . Drug use: Not Currently  . Sexual activity: Not on file  Other Topics Concern  . Not on file  Social History Narrative  . Not on file   Social Determinants of Health   Financial Resource Strain: Not on file  Food Insecurity: Not on file  Transportation Needs: Not on file  Physical Activity: Not on file  Stress: Not on file  Social Connections: Not on file  Intimate Partner Violence: Not on file    Review of Systems: 12 system ROS is negative except as noted above.   Physical Exam: General:   Alert,  well-nourished, pleasant and cooperative in NAD Head:  Normocephalic and atraumatic. Eyes:  Sclera clear, no icterus.   Conjunctiva pink. Ears:  Normal auditory acuity. Nose:  No deformity, discharge,  or lesions. Mouth:  No deformity or lesions.   Abdomen:  Soft, nondistended, normal bowel sounds, no rebound or guarding. No hepatosplenomegaly.  I am unable to reproduce her pain. But, she localizes it to the RLQ and LLQ port scars and the umbilicus.  Rectal:  Deferred  Msk:  Symmetrical. No boney deformities LAD: No inguinal or umbilical LAD Extremities:  No clubbing or edema. Neurologic:  Alert and  oriented x4;  grossly nonfocal Skin:  Intact without significant lesions or rashes. Psych:  Alert and cooperative. Normal mood and affect.     Iman Orourke L. Tarri Glenn, MD, MPH 04/21/2020, 1:07 PM

## 2020-01-02 ENCOUNTER — Other Ambulatory Visit: Payer: PRIVATE HEALTH INSURANCE

## 2020-01-02 DIAGNOSIS — K51919 Ulcerative colitis, unspecified with unspecified complications: Secondary | ICD-10-CM

## 2020-01-07 LAB — GI PROFILE, STOOL, PCR

## 2020-01-07 LAB — CALPROTECTIN, FECAL: Calprotectin, Fecal: 538 ug/g — ABNORMAL HIGH (ref 0–120)

## 2020-01-09 ENCOUNTER — Other Ambulatory Visit: Payer: Self-pay | Admitting: Gastroenterology

## 2020-01-28 ENCOUNTER — Other Ambulatory Visit: Payer: Self-pay

## 2020-01-28 MED ORDER — RIFAXIMIN 550 MG PO TABS: 550.0000 mg | ORAL_TABLET | Freq: Three times a day (TID) | ORAL | 0 refills | Status: AC

## 2020-01-28 NOTE — Telephone Encounter (Signed)
I have sent the prescription to [place].

## 2020-01-30 ENCOUNTER — Other Ambulatory Visit: Payer: Self-pay | Admitting: Gastroenterology

## 2020-03-12 ENCOUNTER — Telehealth: Payer: Self-pay | Admitting: Dermatology

## 2020-03-12 NOTE — Telephone Encounter (Signed)
Senderra on phone. Was Evelyn Goodwin patient

## 2020-03-12 NOTE — Telephone Encounter (Signed)
Spoke with April through the [facility] portal to inform them that the patient is no longer under our care per patient and they need to cancel her prescriptions with [place]. Per April they will cancel the prescription and update the patient's profile.

## 2020-03-12 NOTE — Telephone Encounter (Signed)
Phone call to patient to get her scheduled to be seen so we can do her re-enrollment for her Cosentyx.  Patient stated that she left our office and is no longer our patient.

## 2020-03-12 NOTE — Telephone Encounter (Signed)
Phone call from [place] with Evelyn Goodwin stating that the patient is due for re-enrollment on her Cosentyx.  I informed [name] that patient is due an appointment and we will reach out to her and get the patient scheduled so we can start the re-enrollment process.

## 2020-03-31 NOTE — Telephone Encounter (Signed)
Shirlean Mylar do you know if you have received any records from the surgeons office for this pt?

## 2020-04-01 NOTE — Telephone Encounter (Signed)
Maya have you seen any records for this pt?

## 2020-04-01 NOTE — Telephone Encounter (Signed)
Evelyn Goodwin have you received any records on this pt?

## 2020-04-08 ENCOUNTER — Telehealth: Payer: Self-pay | Admitting: Gastroenterology

## 2020-04-08 NOTE — Telephone Encounter (Signed)
Inbound call from patient requesting a call back fro a nurse please.  States she went to see her dermatologist today where they advised her to start on either Humira or Stelalra but stated would need to get clearance from her GI first.

## 2020-04-08 NOTE — Telephone Encounter (Signed)
Returned call and got message that voice mailbox is full and cannot accept messages. Will try again.

## 2020-04-09 NOTE — Telephone Encounter (Signed)
See my chart message

## 2020-05-28 ENCOUNTER — Ambulatory Visit (INDEPENDENT_AMBULATORY_CARE_PROVIDER_SITE_OTHER): Payer: PRIVATE HEALTH INSURANCE | Admitting: Gastroenterology

## 2020-05-28 ENCOUNTER — Encounter: Payer: Self-pay | Admitting: Gastroenterology

## 2020-05-28 ENCOUNTER — Other Ambulatory Visit: Payer: Self-pay

## 2020-05-28 VITALS — BP 110/80 | HR 88 | Ht 67.75 in | Wt 121.0 lb

## 2020-05-28 DIAGNOSIS — R197 Diarrhea, unspecified: Secondary | ICD-10-CM | POA: Diagnosis not present

## 2020-05-28 MED ORDER — DICYCLOMINE HCL 20 MG PO TABS: 20.0000 mg | ORAL_TABLET | Freq: Four times a day (QID) | ORAL | 1 refills | Status: DC

## 2020-05-28 NOTE — Patient Instructions (Addendum)
It was a pleasure to meet you today. Based on our discussion, I am providing you with my recommendations below:  RECOMMENDATION(S):   I would like for you to continue Dicyclomine. If no improvements, we may need to consider a trial of Xifaxan for 14 days  PRESCRIPTION MEDICATION(S):   We have sent the following medication(s) to your pharmacy:  . Dicyclomine  NOTE: If your medication(s) requires a PRIOR AUTHORIZATION, we will receive notification from your pharmacy. Once received, the process to submit for approval may take up to 7-10 business days. You will be contacted about any denials we have received from your insurance company as well as alternatives recommended by your provider.  FOLLOW UP:  . I would like for you to follow up with me as needed. Please call the office at [phone] to schedule your appointment.  BMI:  . If you are age 24 or younger, your body mass index should be between 19-25. Your Body mass index is 18.53 kg/m. If this is out of the aformentioned range listed, please consider follow up with your Primary Care Provider.   Thank you for trusting me with your gastrointestinal care!    Thornton Park, MD, MPH

## 2020-05-28 NOTE — Progress Notes (Addendum)
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 Symptoms have clinically resolved, although she'd like to keep Bentyl on hand to use PRN. No obvious evidence to support an underlying diagnosis of ulcerative colitis at this time. However, this remains a possibility. Would repeat colonoscopy with biopsies with any recurrent symptoms.  I see no indication to start/alter her treatment at this time, including no GI indications for biologics.   I spoke with Dr. Darrick Meigs, her dermatologist, after her visit today to coordinate care as he was considering alternatives to Cosentyx. I do not think treatment changes for her psoriasis or psoriatic arthritis need to be dosed for colitis at this time. She is considering pregnancy. We discussed that Bentyl is safe to use in pregnancy but not while breastfeeding.    PLAN: - Continue dicylcomine 20 mg po QID - If symptoms recur, trial of Xifaxan 550 mg TID x 14 days - Follow-up PRN   HPI: Evelyn Goodwin is a 25 y.o. female who returns in scheduled follow-up.  She has acne, guttate psoriasis, and psoriatic arthritis and has been on cosentyx for one year after achieving remission with methotrexate (treated by [facility]).   She had ruptured appendicitis in April. She was initially treated with antibiotics, but abscess got larger and she subsequently underwent surgery on April 19.  She had significant postoperative abdominal pain and diarrhea. Diagnosed with C. Difficile.  CT scan at the time showed pancolonic wall thickening and inflammation to the rectum.  She was treated  with vancomycin 125 mg 4 times daily for 2 weeks.  Her symptoms only temporarily improved. C. difficile was positive again on June 25 and had ongoing diffuse abdominal pain and diarrhea, including nocturnal symptoms.    Colonoscopy 10/10/19 - Ulcerated mucosa in the entire examined colon. No rectal sparing. Biopsied. - The examined portion of the ileum was normal. Biopsied.  Ileal biopsies were normal. Colon biopsies showed a mildly active nonspecific colitis. The biopsies show mild edema, lymphoplasmacytosis and focal cryptitis. Features of chronicity were not seen. The pathologist noted that the differential diagnosis mainly includes infection and drug-effect.  When she was last seen 01/01/20 she was still having some altered bowel habits.  She returns today in scheduled follow-up. Bowel habits have returned to normal. She is have 1-2 formed bowel movements daily. Abdominal pain has largely resolved. Tried a week without Bentyl without improvement.  She is hoping to get pregnancy and cautiously thinking about treatment options.   Past Medical History:  Diagnosis Date  . Anxiety   . Clostridium difficile infection   . Depression   . Psoriasis   . Scoliosis     Past Surgical History:  Procedure Laterality Date  . LAPAROSCOPIC APPENDECTOMY N/A 05/07/2019   Procedure: APPENDECTOMY LAPAROSCOPIC;  Surgeon: Georganna Skeans, MD;  Location: [place];  Service: General;  Laterality: N/A;  . MOUTH SURGERY     25 year old molers removed  . WISDOM TOOTH EXTRACTION  2014    Current Outpatient Medications  Medication Sig Dispense Refill  . amphetamine-dextroamphetamine (ADDERALL XR) 20 MG 24 hr capsule Take 20 mg by mouth daily as needed (to focus on work days).     Marland Kitchen  cyclobenzaprine (FLEXERIL) 5 MG tablet Take 5 mg by mouth at bedtime as needed for muscle spasms.     Marland Kitchen ibuprofen (ADVIL) 200 MG tablet Take 600 mg by mouth every 6 (six) hours as needed for headache (pain).    .  norethindrone-ethinyl estradiol (LOESTRIN) 1-20 MG-MCG tablet Take 1 tablet by mouth at bedtime.     . ondansetron (ZOFRAN ODT) 4 MG disintegrating tablet Take 1 tablet (4 mg total) by mouth every 6 (six) hours as needed. (Patient taking differently: Take 4 mg by mouth every 6 (six) hours as needed for nausea or vomiting.) 20 tablet 0  . saccharomyces boulardii (FLORASTOR) 250 MG capsule Take 1 capsule (250 mg total) by mouth 2 (two) times daily. (Patient taking differently: Take 250 mg by mouth daily.) 60 capsule 3  . dicyclomine (BENTYL) 20 MG tablet Take 1 tablet (20 mg total) by mouth in the morning, at noon, in the evening, and at bedtime. 360 tablet 1   No current facility-administered medications for this visit.    Allergies as of 05/28/2020 - Review Complete 05/28/2020  Allergen Reaction Noted  . Penicillin g Rash 05/10/2016  . Vancomycin Rash 07/24/2019    Family History  Problem Relation Age of Onset  . Psoriasis Mother   . Healthy Brother   . Colon cancer Neg Hx   . Esophageal cancer Neg Hx   . Pancreatic cancer Neg Hx   . Stomach cancer Neg Hx     Social History   Socioeconomic History  . Marital status: Single    Spouse name: Not on file  . Number of children: 0  . Years of education: Not on file  . Highest education level: Not on file  Occupational History  . 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 Smoking status: Never Smoker  . Smokeless tobacco: Never Used  Vaping Use  . Vaping Use: Never used  Substance and Sexual Activity  . Alcohol use: Yes    Comment: Rare  . Drug use: Not Currently  . Sexual activity: Not on file  Other Topics Concern  . Not on file  Social History Narrative  . Not on file   Social Determinants of Health   Financial Resource Strain: Not on file  Food Insecurity: Not on file  Transportation Needs: Not on file  Physical Activity: Not on file  Stress: Not on file  Social Connections: Not  on file  Intimate Partner Violence: Not on file    Review of Systems: 12 system ROS is negative except as noted above.   Physical Exam: General:   Alert,  well-nourished, pleasant and cooperative in NAD Head:  Normocephalic and atraumatic. Eyes:  Sclera clear, no icterus.   Conjunctiva pink. Ears:  Normal auditory acuity. Nose:  No deformity, discharge,  or lesions. Mouth:  No deformity or lesions.   Abdomen:  Soft, nondistended, normal bowel sounds, no rebound or guarding. No hepatosplenomegaly.  I am unable to reproduce her pain. But, she localizes it to the RLQ and LLQ port scars and the umbilicus.  Rectal:  Deferred  Msk:  Symmetrical. No boney deformities LAD: No inguinal or umbilical LAD Extremities:  No clubbing or edema. Neurologic:  Alert and  oriented x4;  grossly nonfocal Skin:  Intact without significant lesions or rashes. Psych:  Alert and cooperative. Normal mood and affect.     Evelyn Game L. Tarri Glenn, MD, MPH 05/28/2020, 12:16 PM

## 2020-08-27 ENCOUNTER — Telehealth: Payer: Self-pay | Admitting: Gastroenterology

## 2020-08-27 NOTE — Telephone Encounter (Signed)
Inbound call from patient. States she is having changes in bowel habit and loss of appetite. Would like a call to see if she need an OV? She sent a mychart message on 8/5.

## 2020-08-27 NOTE — Telephone Encounter (Signed)
Pt calling back regarding pt advice request that she sent on 08/22/20. Please see below and advise:  Agnes Lawrence to Thornton Park, MD   RR    8:45 PM Hello Dr. Joelene Millin, I have had a change in bowel habits I am not sure if it is my anxiety or what. I am going to get the bentyl started again in the morning. Recently it was hard and soft but slightly mushy. I am curious if eating tofu messes with the bowel in any way. But, it also could have been the diet change. Can we schedule an e-visit or regular visit some time? Let me know what is easier for you. I also am starting my periods again and trying to conceive. I will let you know as well as my primary if it is successful. Thanks again! Wells Guiles

## 2020-08-28 ENCOUNTER — Telehealth: Payer: Self-pay | Admitting: Gastroenterology

## 2020-08-28 NOTE — Telephone Encounter (Signed)
Evelyn Goodwin will this pts insurance cover a virtual visit?

## 2020-08-28 NOTE — Telephone Encounter (Signed)
Pt cancelled her appt with Dr. Tarri Glenn on 8/65/16 due to conflict with school. Unfortunatelly, she was not able to r/s appt due to school conflict. She stated that she could do a virtual visit. She also stated that she will start taking Bentyl again and will message you with an update.

## 2020-08-28 NOTE — Telephone Encounter (Signed)
Not sure what happened to the original message.   Pt aware and scheduled to see Dr. Tarri Glenn 09/01/20 at 10:30am. Pt notified via mychart.

## 2020-08-29 NOTE — Telephone Encounter (Signed)
Unsure if patient's insurance will cover virtual visits with a specialist. She would need to call the phone number on the back of her insurance card to verify.

## 2020-09-01 ENCOUNTER — Ambulatory Visit: Payer: PRIVATE HEALTH INSURANCE | Admitting: Gastroenterology

## 2020-10-08 ENCOUNTER — Encounter: Payer: Self-pay | Admitting: Nurse Practitioner

## 2020-10-08 ENCOUNTER — Ambulatory Visit (INDEPENDENT_AMBULATORY_CARE_PROVIDER_SITE_OTHER): Payer: No Typology Code available for payment source | Admitting: Nurse Practitioner

## 2020-10-08 VITALS — BP 110/70 | HR 99 | Ht 67.75 in | Wt 114.0 lb

## 2020-10-08 DIAGNOSIS — R112 Nausea with vomiting, unspecified: Secondary | ICD-10-CM

## 2020-10-08 DIAGNOSIS — R109 Unspecified abdominal pain: Secondary | ICD-10-CM

## 2020-10-08 MED ORDER — OMEPRAZOLE MAGNESIUM 20 MG PO TBEC: 20.0000 mg | DELAYED_RELEASE_TABLET | Freq: Every day | ORAL | 0 refills | Status: DC

## 2020-10-08 NOTE — Patient Instructions (Signed)
If you are age 25 or older, your body mass index should be between 23-30. Your Body mass index is 17.46 kg/m. If this is out of the aforementioned range listed, please consider follow up with your Primary Care Provider.  If you are age 15 or younger, your body mass index should be between 19-25. Your Body mass index is 17.46 kg/m. If this is out of the aformentioned range listed, please consider follow up with your Primary Care Provider.   __________________________________________________________  The [facility] providers would like to encourage you to use [facility] to communicate with providers for non-urgent requests or questions.  Due to long hold times on the telephone, sending your provider a message by [facility] may be a faster and more efficient way to get a response.  Please allow 48 business hours for a response.  Please remember that this is for non-urgent requests.   Please purchase the following medications over the counter and take as directed: Prilosec 20 mg: Take once daily before breakfast for 1 month.  We will call you after speaking with Dr. Tarri Glenn regarding further recommendations.  Thank you for entrusting me with your care and for choosing [facility], Tye Savoy, NP-C

## 2020-10-08 NOTE — Progress Notes (Signed)
ASSESSMENT AND PLAN    #25 year old female with chronic intermittent generalized abdominal cramping and diarrhea not necessarily related to eating .  Etiology unclear.  She has a history of C. difficile colitis a year ago and acute active colitis was found on colonoscopy with biopsies. 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 Her bowel movements have been normal for the last several weeks but continues to have generalized abdominal cramping. Right now there is no evidence for IBD, especially in the absence of diarrhea. She ran out of Bentyl rx --Refill Bentyl --I don't have an answer for her ongoing intermittent symptoms, especially the generalized abdominal cramping. She is nontoxic-appearing and abdominal exam is benign so no need for imaging.  Currently her bowel movements are normal so no need for stool studies.   --Dicyclomine does help the intermittent cramping.  Will refill Dicyclomine.  # Nausea.  She reports a negative pregnancy test a month ago.  Her last menstrual cycle was earlier this month.  She does take ibuprofen 2-3 times a week --I have asked her to try Prilosec OTC every morning for the next month in case she does have NSAID induced mucosal injury/PUD   HISTORY OF PRESENT ILLNESS    Chief Complaint :  abdominal cramps  Evelyn Goodwin is a 25 y.o. female known to Dr. Tarri Glenn  with a past medical history significant for C-difficile infection. See PMH below for any additional medical problems.    06/13/19 established care here for abdominal pain and diarrhea.  She had previously been diagnosed with a ruptured appendicitis with abscess (April) and underwent surgery.  CT scan early May had shown resolution of the inflammatory process in RLL but there was mucosal edema of sigmoid / rectum.  Surgery treated her with antibiotics for possible colitis. Following that she developed leukocytosis. Repeat CT scan day of office visit showed diffuse colitis.   Stool studies ordered and returned positive for C. difficile.  She completed course of vancomycin  07/16/20 office visit for recurrent diarrhea and abdominal pain.  Retreated with a vancomycin taper and florastor.  Symptoms improved but based on telephone notes to the office patient developed an intolerance/allergy to oral vancomycin and was switched to fidaxomicin.  Diarrhea improved.  Follow-up CT scan early July 2020 showed resolving colitis.  Mild gallbladder wall thickening without pericholecystic fluid or inflammation  09/26/19 office follow-up with me.  Patient was scheduled for colonoscopy which showed ulcerated mucosa in the entire examined colon.  Ileal biopsies normal.  Colon biopsy showed mildly active nonspecific colitis.  There was mild edema, lymphoplasmacytosis and focal cryptitis.  No features of chronicity.  Pathologist noted differential diagnosis mainly included infection and drug effect  Dec 2021 -patient called the office with diarrhea.  Stool studies ordered.  Trial of doxycycline given, dietary instructions provided.   01/01/20 office follow-up.  Given dicyclomine QID, consider Xifaxan if no improvement.  GI pathogen panel negative.  Fecal calprotectin elevated at 538, CRP normal, sed rate elevated at 33  May 2022 office visit.  Due to colitis / hx of C-diff patient's Cosentrx had been on hold by Dermatology.  Dr. Tarri Glenn spoke to patient's dermatologist following office visit.  She did not feel that any treatment changes were needed for patient's psoriatic arthritis from a GI standpoint.   08/27/20 patient called the office with bowel changes and loss of appetite.  She thought it could be anxiety related. Given an appointment to see Dr. Tarri Glenn on 09/01/2020  but had to cancel due to school.  Rescheduled with me today   INTERVAL HISTORY:  Patient says her bowel changes for which she called on 8/10 have resolved. However , she has been having generalized abdominal cramping since  Sunday ( 3 days ago). Pain feels exactly like it did prior to having appendix removed. Since appendectomy she has struggled with intermittent cramping. She cannot correlate onset of cramps to any certain foods or really even eating at all. She doesn't feel like cramping is related to anxiety. Currently bowel movements are normal. Having a solid BM 1-2 times a day. No blood in stool. Bentyl helps cramps but she ran out of the prescription. Pepto helps temporarily. She also has frequent nausea. Sometimes eating helps but not consistently. She doesn't use THC. Pregnancy test a month ago was negative. LMP on 09/23/20.  She takes ibuprofen a couple times a week for headaches   PREVIOUS ENDOSCOPIC EVALUATIONS / PERTINENT STUDIES:   10/10/2019 colonoscopy Ulcerated mucosa in the entire examined colon. No rectal sparing. Biopsied. - The examined portion of the ileum was normal. Biopsied  Diagnosis 1. Surgical [P], small bowel, terminal ileum - ILEAL MUCOSA WITH NO SPECIFIC HISTOPATHOLOGIC CHANGES - NEGATIVE FOR ACUTE INFLAMMATION, FEATURES OF CHRONICITY OR GRANULOMAS 2. Surgical [P], right colon - MILDLY ACTIVE NONSPECIFIC COLITIS - NEGATIVE FOR GRANULOMAS OR FEATURES OF CHRONICITY 3. Surgical [P], transverse colon - FOCAL ACTIVE COLITIS - NEGATIVE FOR GRANULOMAS OR FEATURES OF CHRONICITY 4. Surgical [P], left colon - MILDLY ACTIVE NONSPECIFIC COLITIS - NEGATIVE FOR GRANULOMAS OR FEATURES OF CHRONICITY 5. Surgical [P], colon, rectum - MILDLY ACTIVE NONSPECIFIC PROCTITIS - NEGATIVE FOR GRANULOMAS OR FEATURES OF CHRONICITY  Past Medical History:  Diagnosis Date   Anxiety    Clostridium difficile infection    Depression    Psoriasis    Scoliosis     Current Medications, Allergies, Past Surgical History, Family History and Social History were reviewed in [facility] record.   Current Outpatient Medications  Medication Sig Dispense Refill   amphetamine-dextroamphetamine  (ADDERALL XR) 20 MG 24 hr capsule Take 20 mg by mouth daily as needed (to focus on work days).      cyclobenzaprine (FLEXERIL) 5 MG tablet Take 5 mg by mouth at bedtime as needed for muscle spasms.      dicyclomine (BENTYL) 20 MG tablet Take 1 tablet (20 mg total) by mouth in the morning, at noon, in the evening, and at bedtime. 360 tablet 1   ibuprofen (ADVIL) 200 MG tablet Take 600 mg by mouth every 6 (six) hours as needed for headache (pain).     norethindrone-ethinyl estradiol (LOESTRIN) 1-20 MG-MCG tablet Take 1 tablet by mouth at bedtime.      ondansetron (ZOFRAN ODT) 4 MG disintegrating tablet Take 1 tablet (4 mg total) by mouth every 6 (six) hours as needed. (Patient taking differently: Take 4 mg by mouth every 6 (six) hours as needed for nausea or vomiting.) 20 tablet 0   saccharomyces boulardii (FLORASTOR) 250 MG capsule Take 1 capsule (250 mg total) by mouth 2 (two) times daily. (Patient taking differently: Take 250 mg by mouth daily.) 60 capsule 3   No current facility-administered medications for this visit.    Review of Systems: No chest pain. No shortness of breath. No urinary complaints.   PHYSICAL EXAM :    Wt Readings from Last 3 Encounters:  05/28/20 121 lb (54.9 kg)  01/01/20 128 lb (58.1 kg)  10/10/19 122 lb (55.3 kg)  BP 110/70   Pulse 99   Ht 5' 7.75" (1.721 m)   Wt 114 lb (51.7 kg)   SpO2 98%   BMI 17.46 kg/m  Constitutional:  Pleasant female in no acute distress. Psychiatric: Normal mood and affect. Behavior is normal. EENT: Pupils normal.  Conjunctivae are normal. No scleral icterus. Neck supple.  Cardiovascular: Normal rate, regular rhythm. No edema Pulmonary/chest: Effort normal and breath sounds normal. No wheezing, rales or rhonchi. Abdominal: Soft, nondistended, nontender. Bowel sounds active throughout. There are no masses palpable. No hepatomegaly. Neurological: Alert and oriented to person place and time. Skin: Skin is warm and dry. No rashes  noted.  I spent 30 minutes total reviewing records, obtaining history, performing exam, counseling patient and documenting visit / findings.   Tye Savoy, NP  10/08/2020, 10:24 AM

## 2020-10-13 NOTE — Progress Notes (Signed)
Reviewed. Office follow-up in 2-3 months to monitor response to treatment, earlier if needed.   Kadisha Goodine L. Tarri Glenn, MD, MPH

## 2020-10-14 ENCOUNTER — Telehealth: Payer: Self-pay

## 2020-10-14 NOTE — Telephone Encounter (Signed)
Appointment Information  Name: Evelyn Goodwin, Evelyn Goodwin MRN: [id number]  Date: 12/19/2020 Status: Sch  Time: 1:30 PM Length: 20  Visit Type: FOLLOW UP 20 [336] Copay: $0.00  Provider: Thornton Park, MD Department: [facility]  Referring Provider: Heywood Bene CSN: [id number]  Notes: 3 mth F/U appt/sched w pt//[employer]//MG  Made On: Change Notes: 10/14/2020 8:44 AM 10/14/2020 8:44 AM By: By: Rickard Rhymes Y

## 2020-10-14 NOTE — Telephone Encounter (Signed)
Called pt to schedule f/u 2-3 months to eval tx't for abd pain. LVM requesting returned call.

## 2020-10-14 NOTE — Telephone Encounter (Signed)
-----   Message from Thornton Park, MD sent at 10/13/2020  8:54 PM EDT -----    ----- Message ----- From: Willia Craze, NP Sent: 10/08/2020   6:20 PM EDT To: Thornton Park, MD

## 2020-10-14 NOTE — Telephone Encounter (Signed)
Patient follow up appointment scheduled for 12/19/20 at 1:30pm with Dr. Tarri Glenn. Thanks

## 2020-10-21 ENCOUNTER — Ambulatory Visit (INDEPENDENT_AMBULATORY_CARE_PROVIDER_SITE_OTHER): Payer: No Typology Code available for payment source | Admitting: Dermatology

## 2020-10-21 ENCOUNTER — Telehealth: Payer: Self-pay | Admitting: Nurse Practitioner

## 2020-10-21 ENCOUNTER — Other Ambulatory Visit: Payer: Self-pay

## 2020-10-21 ENCOUNTER — Encounter: Payer: Self-pay | Admitting: Dermatology

## 2020-10-21 DIAGNOSIS — Z79899 Other long term (current) drug therapy: Secondary | ICD-10-CM

## 2020-10-21 DIAGNOSIS — L409 Psoriasis, unspecified: Secondary | ICD-10-CM

## 2020-10-21 NOTE — Telephone Encounter (Signed)
Patient saw dermatology today. She is asking for GI input on her treatment plan.  Dr Denna Haggard is offering Skyrizi or Humira for treatment of psoriasis. From a GI standpoint would one be more beneficial than the other?

## 2020-10-21 NOTE — Telephone Encounter (Signed)
Inbound call from patient. Would like to discuss if she should go on humira or skyrizi following dermatologist appt?

## 2020-10-23 ENCOUNTER — Telehealth: Payer: Self-pay | Admitting: Dermatology

## 2020-10-23 NOTE — Telephone Encounter (Signed)
Patient is advised.  

## 2020-10-23 NOTE — Telephone Encounter (Signed)
Patient calling to let ST know that per her GI doctor either one of the biologics that were discussed is fine for her to take. She preferred Humira.

## 2020-10-27 ENCOUNTER — Encounter: Payer: Self-pay | Admitting: Dermatology

## 2020-10-27 NOTE — Telephone Encounter (Signed)
Skyrizi new start done through [employer] portal.

## 2020-10-27 NOTE — Telephone Encounter (Signed)
Fax received from [facility] with the patient's PPD results. No results on the faxed paper.

## 2020-10-27 NOTE — Telephone Encounter (Signed)
Verbal given from Dr. Denna Haggard to start the patient on [place].

## 2020-10-27 NOTE — Telephone Encounter (Signed)
Phone call to [facility] to get the patient's PPD (TB) test results since the information wasn't on the fax information that was sent over. Per Tammy she will fax over the correct results for the patient.

## 2020-10-30 ENCOUNTER — Telehealth: Payer: Self-pay | Admitting: Dermatology

## 2020-10-30 NOTE — Telephone Encounter (Signed)
Called patient to inform her that prior authorization has been approved for skyrizi 10/28/2020-10/28/2021 . PA number [id number]. Told patient that she could call [facility] at [phone] to check status.

## 2020-10-30 NOTE — Telephone Encounter (Signed)
Patient is calling to say that she will be losing her insurance possibly on 11/18/2020 due to leaving her job and would like to "get the ball rolling" on getting the [facility] prescription.  Patient would like a return telephone call please.

## 2020-10-30 NOTE — Telephone Encounter (Deleted)
Skyrizi. She spoke to [facility], was told it was sent to pharmacy , which said they didn't get it.

## 2020-11-09 ENCOUNTER — Encounter: Payer: Self-pay | Admitting: Dermatology

## 2020-11-09 NOTE — Progress Notes (Signed)
   Follow-Up Visit   Subjective  Evelyn Goodwin is a 25 y.o. female who presents for the following: Psoriasis (Flare on face, ears, fingers & arms - also c/o joint pain tx- cosentyx- per patient cause gastro intestinal issues and I refuse to take again- spoke with gastro doctor and cosentyx rep and it does have gastro side effects. Patient has tried and failed cimzia, mtx & cosentyx along with several topicals. ).  Psoriasis flaring joint symptoms. Location:  Duration:  Quality:  Associated Signs/Symptoms: Modifying Factors:  Severity:  Timing: Context:   Objective  Well appearing patient in no apparent distress; mood and affect are within normal limits. Left Ear, Left Elbow - Posterior, Left Forearm - Posterior, Left Hand - Posterior, Right Buccal Cheek, Right Ear, Right Elbow - Posterior, Right Forearm - Posterior Although patient has less than 10% body surface area involved, with joint symptoms and significant negative impact on her life I do believe she is a candidate for systemic therapy I would not significant detail about her joint symptoms having priority over her skin since they could be irreversible.  Patient stated that her gastroenterologist was interested in her being on Humira but she has already tried another TNF alpha blocker (Cimzia) so I would favor Skyrizi therapy.  She will do some reading on this and contact us next week.    A focused examination was performed including head, neck, arms, legs, back.. Relevant physical exam findings are noted in the Assessment and Plan.   Assessment & Plan    Psoriasis Left Ear; Right Ear; Left Elbow - Posterior; Right Elbow - Posterior; Left Hand - Posterior; Left Forearm - Posterior; Right Forearm - Posterior; Right Buccal Cheek  Skyrizi new start per dr Denna Haggard, Humira per patient- will do some reading and check with GI doctor to see which biologic & per patient she has already had Tb test done late July and will get [place] a  copy of those results  Encounter for long-term (current) use of medications      I, Evelyn Monarch, MD, have reviewed all documentation for this visit.  The documentation on 11/09/20 for the exam, diagnosis, procedures, and orders are all accurate and complete.

## 2020-11-10 ENCOUNTER — Encounter: Payer: Self-pay | Admitting: Gastroenterology

## 2020-11-10 ENCOUNTER — Ambulatory Visit (INDEPENDENT_AMBULATORY_CARE_PROVIDER_SITE_OTHER): Payer: No Typology Code available for payment source | Admitting: Gastroenterology

## 2020-11-10 VITALS — BP 94/66 | HR 88 | Ht 66.54 in | Wt 113.2 lb

## 2020-11-10 DIAGNOSIS — R1013 Epigastric pain: Secondary | ICD-10-CM

## 2020-11-10 DIAGNOSIS — R1010 Upper abdominal pain, unspecified: Secondary | ICD-10-CM

## 2020-11-10 MED ORDER — FAMOTIDINE 20 MG PO TABS: 20.0000 mg | ORAL_TABLET | Freq: Two times a day (BID) | ORAL | 3 refills | Status: DC

## 2020-11-10 NOTE — Patient Instructions (Signed)
ENDOSCOPY:   You have been scheduled for an endoscopy. Please follow written instructions given to you at your visit today.  INHALERS:   If you use inhalers (even only as needed), please bring them with you on the day of your procedure.  PRESCRIPTION MEDICATION(S):   We have sent the following medication(s) to your pharmacy:  Famotidine 20 mg twice daily.  NOTE: If your medication(s) requires a PRIOR AUTHORIZATION, we will receive notification from your pharmacy. Once received, the process to submit for approval may take up to 7-10 business days. You will be contacted about any denials we have received from your insurance company as well as alternatives recommended by your provider.   It was my pleasure to provide care to you today. Based on our discussion, I am providing you with my recommendations below:  RECOMMENDATION(S):    FOLLOW UP:   After your procedure, you will receive a call from my office staff regarding my recommendation for follow up.  BMI:  If you are age 35 or older, your body mass index should be between 23-30. Your Body mass index is 17.98 kg/m. If this is out of the aforementioned range listed, please consider follow up with your Primary Care Provider.  If you are age 26 or younger, your body mass index should be between 19-25. Your Body mass index is 17.98 kg/m. If this is out of the aformentioned range listed, please consider follow up with your Primary Care Provider.   MY CHART:  The [facility] providers would like to encourage you to use [facility] to communicate with providers for non-urgent requests or questions.  Due to long hold times on the telephone, sending your provider a message by [facility] may be a faster and more efficient way to get a response.  Please allow 48 business hours for a response.  Please remember that this is for non-urgent requests.   Thank you for trusting me with your gastrointestinal care!    Thornton Park, MD, MPH

## 2020-11-10 NOTE — Progress Notes (Signed)
Referring Provider: Heywood Bene, * Primary Care Physician:  Heywood Bene, PA-C  Chief complaint:  Abdominal pain, diarrhea   IMPRESSION:  Upper abdominal and epigastric pain - ? 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 She would like to proceed.    HPI: Evelyn Goodwin is a 25 y.o. female who returns in scheduled follow-up.  She was last seen by Tye Savoy 05/28/20. She has acne, guttate psoriasis, and psoriatic arthritis. She had GI symptoms on Cosentyx and is now starting [employer].  She previously failed Cimzia and methotrexate along with multiple topical therapies.   She had ruptured appendicitis April  2021. She was initially treated with antibiotics, but abscess got larger and she subsequently underwent surgery on April 19.  She had significant postoperative abdominal pain and diarrhea. Diagnosed with C. Difficile.  CT scan at the time showed pancolonic wall thickening and inflammation to the rectum.  She was treated with vancomycin 125 mg 4 times daily for 2 weeks.  Her symptoms only temporarily improved. C. difficile was positive again on June 25 and had ongoing diffuse abdominal pain and diarrhea, including nocturnal symptoms.    Colonoscopy 10/10/19 - Ulcerated mucosa in the entire examined colon. No rectal sparing. Biopsied. - The examined portion of the ileum was normal. Biopsied.  Ileal biopsies were normal. Colon biopsies showed a mildly active nonspecific  colitis. The biopsies show mild edema, lymphoplasmacytosis and focal cryptitis. Features of chronicity were not seen. The pathologist noted that the differential diagnosis mainly includes infection and drug-effect.  At the time of her last office visit 05/28/20 she was feeling better with trials of Bentyl and empiric doxycycline. Bowel habits had returned to normal with 1-2 formed bowel movements daily and her abdominal pain had largely resolved.   Returns today with a new complaint of post-prandial epigastric/upper abdominal pain and cramping. Associated nausea. No identified triggers. No further diarrhea.  No dysphagia, odynophagia, neck pain, sore throat, dysphonia, cough, blood in the stool or change in bowel habits. No change in symptoms with PPI. In speaking with her family, she is concerned that she needs to have an upper endoscopy.   Past Medical History:  Diagnosis Date   Anxiety    Clostridium difficile infection    Depression    Psoriasis    Scoliosis     Past Surgical History:  Procedure Laterality Date   LAPAROSCOPIC APPENDECTOMY N/A 05/07/2019   Procedure: APPENDECTOMY LAPAROSCOPIC;  Surgeon: Georganna Skeans, MD;  Location: [facility];  Service: General;  Laterality: N/A;   MOUTH SURGERY     25 year old molers removed   [place] EXTRACTION  2014    Current Outpatient Medications  Medication Sig Dispense Refill   amphetamine-dextroamphetamine (ADDERALL XR) 20 MG 24 hr capsule Take 20 mg by mouth daily as needed (to focus on work days).      ARIPiprazole (ABILIFY) 5 MG tablet Take 1 tablet by mouth daily.     buPROPion (WELLBUTRIN XL) 300  MG 24 hr tablet Take 1 tablet by mouth daily.     dicyclomine (BENTYL) 20 MG tablet Take 1 tablet (20 mg total) by mouth in the morning, at noon, in the evening, and at bedtime. (Patient taking differently: Take 20 mg by mouth as needed.) 360 tablet 1   diphenhydrAMINE HCl, Sleep, (ZZZQUIL PO) Take by mouth. Take at night     ibuprofen  (ADVIL) 200 MG tablet Take 600 mg by mouth every 6 (six) hours as needed for headache (pain).     SKYRIZI PEN 150 MG/ML SOAJ Inject into the skin. 150 mg subcutaneous at week 0 and week 4 and directed     No current facility-administered medications for this visit.    Allergies as of 11/10/2020 - Review Complete 11/10/2020  Allergen Reaction Noted   Penicillin g Rash 05/10/2016   Vancomycin Rash 07/24/2019    Family History  Problem Relation Age of Onset   Psoriasis Mother    Healthy Brother    Colon cancer Neg Hx    Esophageal cancer Neg Hx    Pancreatic cancer Neg Hx    Stomach cancer Neg Hx      Physical Exam: General:   Alert,  well-nourished, pleasant and cooperative in NAD Head:  Normocephalic and atraumatic. Eyes:  Sclera clear, no icterus.   Conjunctiva pink. Abdomen:  Soft, nondistended, normal bowel sounds, no rebound or guarding. No hepatosplenomegaly.  I am unable to reproduce her pain. But, she localizes it to the RLQ and LLQ port scars and the umbilicus.  Neurologic:  Alert and  oriented x4;  grossly nonfocal Skin:  Intact without significant lesions or rashes. Psych:  Alert and cooperative. Normal mood and affect.     Shaylie Eklund L. Tarri Glenn, MD, MPH 11/10/2020, 3:12 PM

## 2020-11-12 ENCOUNTER — Encounter: Payer: Self-pay | Admitting: Gastroenterology

## 2020-11-17 ENCOUNTER — Ambulatory Visit: Payer: No Typology Code available for payment source

## 2020-11-17 ENCOUNTER — Other Ambulatory Visit: Payer: Self-pay

## 2020-11-18 ENCOUNTER — Encounter: Payer: Self-pay | Admitting: Certified Registered Nurse Anesthetist

## 2020-11-19 ENCOUNTER — Other Ambulatory Visit: Payer: Self-pay

## 2020-11-19 ENCOUNTER — Encounter: Payer: Self-pay | Admitting: Gastroenterology

## 2020-11-19 ENCOUNTER — Ambulatory Visit (AMBULATORY_SURGERY_CENTER): Payer: No Typology Code available for payment source | Admitting: Gastroenterology

## 2020-11-19 VITALS — BP 111/69 | HR 65 | Temp 96.8°F | Resp 9 | Ht 66.5 in | Wt 113.0 lb

## 2020-11-19 DIAGNOSIS — K2970 Gastritis, unspecified, without bleeding: Secondary | ICD-10-CM

## 2020-11-19 DIAGNOSIS — R1010 Upper abdominal pain, unspecified: Secondary | ICD-10-CM

## 2020-11-19 DIAGNOSIS — K259 Gastric ulcer, unspecified as acute or chronic, without hemorrhage or perforation: Secondary | ICD-10-CM | POA: Diagnosis not present

## 2020-11-19 DIAGNOSIS — K2990 Gastroduodenitis, unspecified, without bleeding: Secondary | ICD-10-CM

## 2020-11-19 MED ORDER — SODIUM CHLORIDE 0.9 % IV SOLN: 500.0000 mL | Freq: Once | INTRAVENOUS | Status: DC

## 2020-11-19 MED ORDER — PANTOPRAZOLE SODIUM 20 MG PO TBEC
20.0000 mg | DELAYED_RELEASE_TABLET | Freq: Two times a day (BID) | ORAL | 2 refills | Status: DC
Start: 2020-11-19 — End: 2020-11-20

## 2020-11-19 MED ORDER — SUCRALFATE 1 GM/10ML PO SUSP
1.0000 g | Freq: Four times a day (QID) | ORAL | 1 refills | Status: DC
Start: 2020-11-19 — End: 2021-02-23

## 2020-11-19 NOTE — Op Note (Signed)
[place] Patient Name: Evelyn Goodwin Procedure Date: 11/19/2020 9:15 AM MRN: [id number] Endoscopist: Thornton Park MD, MD Age: 25 Referring MD:  Date of Birth: April 10, 1995 Gender: Female Account #: [id number] Procedure:                Upper GI endoscopy Indications:              Epigastric abdominal pain, Upper abdominal pain Medicines:                Monitored Anesthesia Care Procedure:                Pre-Anesthesia Assessment:                           - Prior to the procedure, a History and Physical                            was performed, and patient medications and                            allergies were reviewed. The patient's tolerance of                            previous anesthesia was also reviewed. The risks                            and benefits of the procedure and the sedation                            options and risks were discussed with the patient.                            All questions were answered, and informed consent                            was obtained. Prior Anticoagulants: The patient has                            taken no previous anticoagulant or antiplatelet                            agents. ASA Grade Assessment: II - A patient with                            mild systemic disease. After reviewing the risks                            and benefits, the patient was deemed in                            satisfactory condition to undergo the procedure.                           After obtaining informed consent, the endoscope was  passed under direct vision. Throughout the                            procedure, the patient's blood pressure, pulse, and                            oxygen saturations were monitored continuously. The                            Endoscope was introduced through the mouth, and                            advanced to the third part of duodenum. The upper                            GI  endoscopy was accomplished without difficulty.                            The patient tolerated the procedure well. Scope In: Scope Out: Findings:                 The examined esophagus was normal. Biopsies were                            taken from the distal esophagus with a cold forceps                            for histology. Estimated blood loss was minimal.                           Patchy mildly erythematous mucosa without bleeding                            was found in the gastric body. Biopsies were taken                            from the antrum, body, and fundus with a cold                            forceps for histology. Estimated blood loss was                            minimal.                           The examined duodenum was normal. Biopsies were                            taken with a cold forceps for histology. Estimated                            blood loss was minimal.                           The  cardia and gastric fundus were normal on                            retroflexion.                           The exam was otherwise without abnormality. Complications:            No immediate complications. Estimated blood loss:                            Minimal. Estimated Blood Loss:     Estimated blood loss was minimal. Impression:               - Normal esophagus. Biopsied.                           - Erythematous mucosa in the gastric body. Biopsied.                           - Normal examined duodenum. Biopsied.                           - The examination was otherwise normal. Recommendation:           - Patient has a contact number available for                            emergencies. The signs and symptoms of potential                            delayed complications were discussed with the                            patient. Return to normal activities tomorrow.                            Written discharge instructions were provided to the                             patient.                           - Resume previous diet.                           - Continue present medications including famotidine                            20 mg BID.                           - Start pantoprazole 20 mg BID for at least 10                            weeks.                           -  Start Carafate 1g slurry QID for the next 2 weeks.                           - No aspirin, ibuprofen, naproxen, or other                            non-steroidal anti-inflammatory drugs.                           - Await pathology results.                           - Follow-up in the office in 4-6 weeks, earlier as                            needed. Thornton Park MD, MD 11/19/2020 9:36:50 AM This report has been signed electronically.

## 2020-11-19 NOTE — Progress Notes (Signed)
Called to room to assist during endoscopic procedure.  Patient ID and intended procedure confirmed with present staff. Received instructions for my participation in the procedure from the performing physician.  

## 2020-11-19 NOTE — Progress Notes (Signed)
Referring Provider: Heywood Bene, * Primary Care Physician:  Heywood Bene, PA-C  Reason for procedure:  Abdominal pain, diarrhea   IMPRESSION:  Upper abdominal and epigastric pain - ? 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 female who presents for upper endoscopy.    She had ruptured appendicitis April  2021. She was initially treated with antibiotics, but abscess got larger and she subsequently underwent surgery on April 19.  She had significant postoperative abdominal pain and diarrhea. Diagnosed with C. Difficile.  CT scan at the time showed pancolonic wall thickening and inflammation to the rectum.  She was treated with vancomycin 125 mg 4 times daily for 2 weeks.  Her symptoms only temporarily improved. C. difficile was positive again on June 25 and had ongoing diffuse abdominal pain and diarrhea, including nocturnal symptoms.    Colonoscopy 10/10/19 - Ulcerated mucosa in the entire examined colon. No rectal sparing. Biopsied. - The examined portion of the ileum was normal. Biopsied.  Ileal biopsies were normal. Colon biopsies showed a mildly active nonspecific colitis. The biopsies show mild edema, lymphoplasmacytosis and focal cryptitis. Features of chronicity were not seen. The pathologist noted that the differential diagnosis mainly includes infection and drug-effect.  At the time of her last office visit 05/28/20 she was feeling better with trials of Bentyl and empiric doxycycline. Bowel habits had returned to normal with 1-2 formed bowel movements daily and her abdominal pain had  largely resolved.   Recently seen in the clinic with a new complaint of post-prandial epigastric/upper abdominal pain and cramping. Associated nausea. No identified triggers. No further diarrhea.  No dysphagia, odynophagia, neck pain, sore throat, dysphonia, cough, blood in the stool or change in bowel habits. No change in symptoms with PPI. In speaking with her family, she is concerned that she needs to have an upper endoscopy.   Past Medical History:  Diagnosis Date   Anxiety    Clostridium difficile infection    Depression    Psoriasis    Scoliosis     Past Surgical History:  Procedure Laterality Date   LAPAROSCOPIC APPENDECTOMY N/A 05/07/2019   Procedure: APPENDECTOMY LAPAROSCOPIC;  Surgeon: Georganna Skeans, MD;  Location: [facility];  Service: General;  Laterality: N/A;   MOUTH SURGERY     25 year old molers removed   [place] EXTRACTION  2014    Current Outpatient Medications  Medication Sig Dispense Refill   ibuprofen (ADVIL) 200 MG tablet Take 600 mg by mouth every 6 (six) hours as needed for headache (pain).     SKYRIZI PEN 150 MG/ML SOAJ Inject into the skin. 150 mg subcutaneous at week 0 and week 4 and directed     amphetamine-dextroamphetamine (ADDERALL XR) 20 MG 24 hr capsule Take 20 mg by mouth daily as needed (to focus on work days).  (Patient not taking: Reported on 11/19/2020)     ARIPiprazole (ABILIFY) 5 MG tablet Take 1 tablet by mouth daily. (Patient not taking: Reported on 11/19/2020)     buPROPion (WELLBUTRIN XL) 300 MG 24 hr tablet Take 1 tablet by mouth daily. (Patient not taking: Reported on 11/19/2020)  dicyclomine (BENTYL) 20 MG tablet Take 1 tablet (20 mg total) by mouth in the morning, at noon, in the evening, and at bedtime. (Patient not taking: Reported on 11/19/2020) 360 tablet 1   diphenhydrAMINE HCl, Sleep, (ZZZQUIL PO) Take by mouth. Take at night (Patient not taking: Reported on 11/19/2020)     famotidine (PEPCID) 20 MG tablet Take 1 tablet (20 mg total)  by mouth 2 (two) times daily. (Patient not taking: Reported on 11/19/2020) 90 tablet 3   Current Facility-Administered Medications  Medication Dose Route Frequency Provider Last Rate Last Admin   0.9 %  sodium chloride infusion  500 mL Intravenous Once Thornton Park, MD        Allergies as of 11/19/2020 - Review Complete 11/19/2020  Allergen Reaction Noted   Penicillin g Rash 05/10/2016   Vancomycin Rash 07/24/2019    Family History  Problem Relation Age of Onset   Psoriasis Mother    Healthy Brother    Colon cancer Neg Hx    Esophageal cancer Neg Hx    Pancreatic cancer Neg Hx    Stomach cancer Neg Hx    Rectal cancer Neg Hx      Physical Exam: General:   Alert,  well-nourished, pleasant and cooperative in NAD Head:  Normocephalic and atraumatic. Eyes:  Sclera clear, no icterus.   Conjunctiva pink. Lungs: CTA with good air movement CV: Normal rate and rhythm, no murmurs, no edema Abdomen:  Soft, nondistended, normal bowel sounds, no rebound or guarding. No hepatosplenomegaly.  I am unable to reproduce her pain. But, she localizes it to the RLQ and LLQ port scars and the umbilicus.  Neurologic:  Alert and  oriented x4;  grossly nonfocal Skin:  Intact without significant lesions or rashes. Psych:  Alert and cooperative. Normal mood and affect.     Daishawn Lauf L. Tarri Glenn, MD, MPH 11/19/2020, 9:10 AM

## 2020-11-19 NOTE — Progress Notes (Signed)
Report given to PACU, vss 

## 2020-11-19 NOTE — Progress Notes (Signed)
5913 Robinul 0.1 mg IV given due large amount of secretions upon assessment.  MD made aware, vss

## 2020-11-19 NOTE — Patient Instructions (Signed)
Handout on gastritis given to patient. Await pathology results. Resume previous diet and continue present medications including famotidine 20 mg twice a day. [facility] from [facility] [place] - Pantoprazole 20 mg twice a day for at least 10 weeks and Carafate 1g four times a day for the next 2 weeks. No aspirin, ibuprofen, naproxen, or other non-steroidal anti-inflammatory drugs. Follow up in office in 4-6 weeks, earlier if needed. (Her office will call to schedule this appointment)   YOU HAD AN ENDOSCOPIC PROCEDURE TODAY AT [facility]:   Refer to the procedure report that was given to you for any specific questions about what was found during the examination.  If the procedure report does not answer your questions, please call your gastroenterologist to clarify.  If you requested that your care partner not be given the details of your procedure findings, then the procedure report has been included in a sealed envelope for you to review at your convenience later.  YOU SHOULD EXPECT: Some feelings of bloating in the abdomen. Passage of more gas than usual.  Walking can help get rid of the air that was put into your GI tract during the procedure and reduce the bloating. If you had a lower endoscopy (such as a colonoscopy or flexible sigmoidoscopy) you may notice spotting of blood in your stool or on the toilet paper. If you underwent a bowel prep for your procedure, you may not have a normal bowel movement for a few days.  Please Note:  You might notice some irritation and congestion in your nose or some drainage.  This is from the oxygen used during your procedure.  There is no need for concern and it should clear up in a day or so.  SYMPTOMS TO REPORT IMMEDIATELY:   Following upper endoscopy (EGD)  Vomiting of blood or coffee ground material  New chest pain or pain under the shoulder blades  Painful or persistently difficult swallowing  New shortness of  breath  Fever of 100F or higher  Black, tarry-looking stools  For urgent or emergent issues, a gastroenterologist can be reached at any hour by calling [phone]. Do not use MyChart messaging for urgent concerns.    DIET:  We do recommend a small meal at first, but then you may proceed to your regular diet.  Drink plenty of fluids but you should avoid alcoholic beverages for 24 hours.  ACTIVITY:  You should plan to take it easy for the rest of today and you should NOT DRIVE or use heavy machinery until tomorrow (because of the sedation medicines used during the test).    FOLLOW UP: Our staff will call the number listed on your records 48-72 hours following your procedure to check on you and address any questions or concerns that you may have regarding the information given to you following your procedure. If we do not reach you, we will leave a message.  We will attempt to reach you two times.  During this call, we will ask if you have developed any symptoms of COVID 19. If you develop any symptoms (ie: fever, flu-like symptoms, shortness of breath, cough etc.) before then, please call [phone].  If you test positive for Covid 19 in the 2 weeks post procedure, please call and report this information to [place].    If any biopsies were taken you will be contacted by phone or by letter within the next 1-3 weeks.  Please call us at [phone]  if you have not heard about the biopsies in 3 weeks.    SIGNATURES/CONFIDENTIALITY: You and/or your care partner have signed paperwork which will be entered into your electronic medical record.  These signatures attest to the fact that that the information above on your After Visit Summary has been reviewed and is understood.  Full responsibility of the confidentiality of this discharge information lies with you and/or your care-partner.

## 2020-11-20 ENCOUNTER — Other Ambulatory Visit: Payer: Self-pay

## 2020-11-20 DIAGNOSIS — K2970 Gastritis, unspecified, without bleeding: Secondary | ICD-10-CM

## 2020-11-20 DIAGNOSIS — K2990 Gastroduodenitis, unspecified, without bleeding: Secondary | ICD-10-CM

## 2020-11-20 MED ORDER — FAMOTIDINE 20 MG PO TABS: 20.0000 mg | ORAL_TABLET | Freq: Two times a day (BID) | ORAL | 3 refills | Status: DC

## 2020-11-20 MED ORDER — PANTOPRAZOLE SODIUM 20 MG PO TBEC: 20.0000 mg | DELAYED_RELEASE_TABLET | Freq: Two times a day (BID) | ORAL | 0 refills | Status: DC

## 2020-11-25 ENCOUNTER — Encounter: Payer: Self-pay | Admitting: Gastroenterology

## 2020-12-01 ENCOUNTER — Other Ambulatory Visit: Payer: Self-pay

## 2020-12-01 DIAGNOSIS — R197 Diarrhea, unspecified: Secondary | ICD-10-CM

## 2020-12-01 DIAGNOSIS — R1010 Upper abdominal pain, unspecified: Secondary | ICD-10-CM

## 2020-12-02 ENCOUNTER — Other Ambulatory Visit: Payer: Self-pay

## 2020-12-02 ENCOUNTER — Telehealth: Payer: Self-pay | Admitting: Gastroenterology

## 2020-12-02 NOTE — Telephone Encounter (Signed)
Patient called and said she had read the report following her EGD and just wants to speak with someone about the results.  Please call.

## 2020-12-02 NOTE — Telephone Encounter (Signed)
Returned pt call. States she did not have any questions pertaining to the procedure itself nor about her path. States she is still having ongoing, infrequent "twinges" of abd pain. Confirms she is taking Bentyl 64m QID, Pepcid 238mBID, Pantoprazole 204mID and Carafate susp 88m64mD, all as prescribed. Also takes Skyrizi for psoriasis (not managed nor ordered by our office). Pt chose to cancel her previously schedule 4-6 wk f/u appt from 12/2 to 12/16 d/t insurance concerns. Pt states she is having a soft stool daily w/o blood in stool, rectal pain or rectal bleeding. Denies abd bloating, N/V, chills or fevers. Confirms she is drinking 64+ ounces of fluids per day and does have an adequate dietary intake. Denies "twinges" of pain being associated with the types of foods she is eating nor when she feels she needs to defecate or after having defecated. Advised I will route this information along to Dr. BeavTarri Glennvised she continue her medication regimen as Rx'd, maintain adequate fluid intake and abstain from use of NSAIDs.

## 2020-12-02 NOTE — Telephone Encounter (Signed)
HIDA scan not yet schedule. Called pt to provide contact # [phone] for her to self schedule exam as notification has already been sent 12/01/20 to [facility] Scheduling and is aware of need. LVM requesting returned call.

## 2020-12-03 NOTE — Telephone Encounter (Signed)
SECOND ATTEMPT:   Called pt to f/u on status of self scheduling exam as previously discussed. States she will need to delay scheduling this exam d/t insurance. No further actions required at this time as pt has been given all recommendations to address concerns. She is aware she may self schedule when she is ready to further eval her concerns.  Pt was advised about need for HIDA scan and provided with contact #. In addition, pt DID read message. From 11/29/20 encounter:  Chucky May,   Dr. Tarri Glenn has reviewed your concern. She has ordered a HIDA scan to evaluate your gallbladder. The orders have been placed in our system and a message has been sent to our scheduling department. If you have not heard from them within the next 7 business days, please call them to schedule your exam at [phone]. She has asked that you have an office visit 1 week after completion of your exam to review the results.   With regards to your pathology results, a letter has been mailed to your home address. The contents of the letter can be seen below:   Dear Evelyn Goodwin,   I hope this letter finds you well.  I wanted to follow-up with you after your recent upper endoscopy.   The biopsies of your esophagus were normal.  There was no evidence for reflux or eosinophilic esophagitis. The biopsies from your small intestines, the duodenum, were also normal.  The biopsies that were taken of your stomach showed mild gastritis, an inflammation of the stomach lining, and erosions. There was no infection.  The gastritis is best treated by avoiding nonsteroidal anti-inflammatory medications (NSAIDs) such as ibuprofen, Aleve, Advil, and Naprosyn and taking the pantoprazole 20 mg twice daily and famotidine 20 mg twice daily for at least 8 weeks.  I would like to see you back in the office in 6-8 weeks to be sure that the medications are working.   Thank you for allowing me to participate in your care.  Please call the office  with any additional questions or concerns prior to your next appointment.  Best wishes,  Thornton Park, MD  Last read by Evelyn Goodwin at 3:18 PM on 12/01/2020.

## 2020-12-19 ENCOUNTER — Ambulatory Visit: Payer: No Typology Code available for payment source | Admitting: Gastroenterology

## 2020-12-25 ENCOUNTER — Encounter: Payer: Self-pay | Admitting: Gastroenterology

## 2021-01-02 ENCOUNTER — Ambulatory Visit: Payer: No Typology Code available for payment source | Admitting: Gastroenterology

## 2021-01-21 ENCOUNTER — Telehealth: Payer: Self-pay | Admitting: Dermatology

## 2021-01-21 ENCOUNTER — Ambulatory Visit: Payer: No Typology Code available for payment source | Admitting: Gastroenterology

## 2021-01-21 NOTE — Telephone Encounter (Signed)
Wants to talk to nurse about getting Skyrizi refilled. She said she doesn't have pharmacy phone #. She said you can message her in the portal.

## 2021-01-21 NOTE — Telephone Encounter (Signed)
Informed patient that she would have to call her pharmacy for refill. She states she got new insurance and doesn't know where to get it from- informed her that she would need to call customer service on the back of her card for that information.

## 2021-01-22 ENCOUNTER — Encounter: Payer: Self-pay | Admitting: Dermatology

## 2021-02-02 ENCOUNTER — Other Ambulatory Visit (HOSPITAL_COMMUNITY): Payer: Self-pay | Admitting: Orthopedic Surgery

## 2021-02-02 ENCOUNTER — Other Ambulatory Visit: Payer: Self-pay | Admitting: Orthopedic Surgery

## 2021-02-02 DIAGNOSIS — S62001D Unspecified fracture of navicular [scaphoid] bone of right wrist, subsequent encounter for fracture with routine healing: Secondary | ICD-10-CM

## 2021-02-04 ENCOUNTER — Telehealth: Payer: Self-pay | Admitting: *Deleted

## 2021-02-04 ENCOUNTER — Telehealth: Payer: Self-pay | Admitting: Dermatology

## 2021-02-04 MED ORDER — SKYRIZI (150 MG DOSE) 75 MG/0.83ML ~~LOC~~ PSKT: 150.0000 mg | PREFILLED_SYRINGE | SUBCUTANEOUS | 3 refills | Status: DC

## 2021-02-04 NOTE — Telephone Encounter (Signed)
Patient called stating [facility] needs new prescription for skyrizi- prescription sent in to [facility]

## 2021-02-04 NOTE — Telephone Encounter (Signed)
Evelyn Goodwin got patients prescription for skyrizi they are verifying the insurance.

## 2021-02-04 NOTE — Telephone Encounter (Signed)
Patient came by the office this morning and brought her new insurance information.  The insurance was entered in the system and the new card was scanned as well.  Patient requested that I send a message to the nursing staff to "get the ball rolling on her prescription refill for Skyrizi".

## 2021-02-04 NOTE — Telephone Encounter (Signed)
Gave patient [facility] phone number so that she can call the pharmacy and update them with her new insurance. Patient understood.

## 2021-02-06 ENCOUNTER — Ambulatory Visit (HOSPITAL_COMMUNITY)
Admission: RE | Admit: 2021-02-06 | Discharge: 2021-02-06 | Disposition: A | Discharge status: Home / Self Care | Payer: 59 | Source: Ambulatory Visit | Attending: Orthopedic Surgery | Admitting: Orthopedic Surgery

## 2021-02-06 DIAGNOSIS — S62001D Unspecified fracture of navicular [scaphoid] bone of right wrist, subsequent encounter for fracture with routine healing: Secondary | ICD-10-CM | POA: Diagnosis present

## 2021-02-11 ENCOUNTER — Other Ambulatory Visit (HOSPITAL_COMMUNITY): Payer: Self-pay

## 2021-02-11 ENCOUNTER — Telehealth: Payer: Self-pay | Admitting: *Deleted

## 2021-02-11 ENCOUNTER — Telehealth: Payer: Self-pay | Admitting: Pharmacist

## 2021-02-11 MED ORDER — SKYRIZI 150 MG/ML ~~LOC~~ SOSY
PREFILLED_SYRINGE | SUBCUTANEOUS | 3 refills | Status: DC
  Filled 2021-02-11: qty 1, 84d supply, fill #0
  Filled 2021-02-12: qty 1, 30d supply, fill #0
  Filled 2021-02-13: qty 1, 28d supply, fill #0

## 2021-02-11 NOTE — Telephone Encounter (Signed)
Opened in error

## 2021-02-11 NOTE — Telephone Encounter (Signed)
Called patient to schedule an appointment for the [employer] [facility]. I was unable to reach the patient so I left a HIPAA-compliant message requesting that the patient return my call.   Luke Van Ausdall, PharmD, BCACP, CPP Clinical Pharmacist [facility] [phone]  

## 2021-02-11 NOTE — Telephone Encounter (Signed)
Fax from [facility] stating skyrizi approved 02/10/21- 08/09/2021 PA # [phone]

## 2021-02-12 ENCOUNTER — Other Ambulatory Visit (HOSPITAL_COMMUNITY): Payer: Self-pay

## 2021-02-12 ENCOUNTER — Telehealth: Payer: Self-pay | Admitting: Dermatology

## 2021-02-12 NOTE — Telephone Encounter (Signed)
Phone call to patient to let her know that her skyrizi was approved.

## 2021-02-12 NOTE — Telephone Encounter (Signed)
Patient is calling to say that [place] notified her that a prior authorization needs to be for [employer].

## 2021-02-13 ENCOUNTER — Ambulatory Visit: Payer: 59 | Attending: Internal Medicine | Admitting: Pharmacist

## 2021-02-13 ENCOUNTER — Other Ambulatory Visit: Payer: Self-pay

## 2021-02-13 ENCOUNTER — Other Ambulatory Visit (HOSPITAL_COMMUNITY): Payer: Self-pay

## 2021-02-13 DIAGNOSIS — Z7189 Other specified counseling: Secondary | ICD-10-CM

## 2021-02-13 MED ORDER — SKYRIZI 150 MG/ML ~~LOC~~ SOSY
PREFILLED_SYRINGE | SUBCUTANEOUS | 3 refills | Status: DC
  Filled 2021-02-13: qty 1, 28d supply, fill #0

## 2021-02-13 NOTE — Progress Notes (Signed)
° °  S: Patient presents for review of their specialty medication therapy.  Patient is currently taking Skyrizi for psoriasis. Patient is managed by Dr. Denna Haggard for this.   Adherence: confirms  Efficacy: reports efficacy already. She has taken her 0 and 4 week doses and notes that her plaques on her arms ar no longer visible.   Dosing: Plaque psoriasis, moderate to severe: SubQ: Two consecutive injections (75 mg each) for a total dose of 150 mg at weeks 0, 4, and then every 12 weeks thereafter.  Screening: TB test: completed per patient   Monitoring: S/sx of infection: none  S/sx of hypersensitivity/injection site reaction: none   O:     Lab Results  Component Value Date   WBC 17.6 (H) 07/22/2019   HGB 13.0 07/22/2019   HCT 40.2 07/22/2019   MCV 84.5 07/22/2019   PLT 563 (H) 07/22/2019      Chemistry      Component Value Date/Time   NA 138 07/26/2019 1333   K 3.8 07/26/2019 1333   CL 102 07/26/2019 1333   CO2 26 07/26/2019 1333   BUN 18 07/26/2019 1333   CREATININE 0.63 07/26/2019 1333      Component Value Date/Time   CALCIUM 9.3 07/26/2019 1333   ALKPHOS 68 07/22/2019 1945   AST 18 07/22/2019 1945   ALT 12 07/22/2019 1945   BILITOT 0.3 07/22/2019 1945       A/P: 1. Medication review: Patient currently on [place] for psoriasis. Reviewed the medication with the patient, including the following: Orson Ape is a monoclonal antibody used in the treatment of psoriasis. Patient educated on purpose, proper use and potential adverse effects of Skyrizi. Possible adverse effects are infections, headache, and injection site reactions. Live vaccinations should be avoided while on therapy. SubQ: Administer the two consecutive injections subcutaneously at different anatomic locations, such as thighs, abdomen, or back of upper arms. Do not inject into areas where the skin is tender, bruised, red, hard, or affected by psoriasis. Intended for use under supervision of a health care  professional; self-injection may occur after proper training (except back of upper arms). No recommendations for any changes at this time.   Benard Halsted, PharmD, [facility] [place], [place] [phone]

## 2021-02-14 ENCOUNTER — Other Ambulatory Visit (HOSPITAL_COMMUNITY): Payer: Self-pay

## 2021-02-16 ENCOUNTER — Other Ambulatory Visit (HOSPITAL_COMMUNITY): Payer: Self-pay

## 2021-02-16 ENCOUNTER — Encounter: Payer: Self-pay | Admitting: Gastroenterology

## 2021-02-23 ENCOUNTER — Encounter: Payer: Self-pay | Admitting: Gastroenterology

## 2021-02-23 ENCOUNTER — Ambulatory Visit (INDEPENDENT_AMBULATORY_CARE_PROVIDER_SITE_OTHER): Payer: 59 | Admitting: Gastroenterology

## 2021-02-23 VITALS — BP 122/70 | HR 99 | Ht 66.0 in | Wt 117.0 lb

## 2021-02-23 DIAGNOSIS — R1010 Upper abdominal pain, unspecified: Secondary | ICD-10-CM | POA: Diagnosis not present

## 2021-02-23 DIAGNOSIS — K2990 Gastroduodenitis, unspecified, without bleeding: Secondary | ICD-10-CM

## 2021-02-23 DIAGNOSIS — R634 Abnormal weight loss: Secondary | ICD-10-CM | POA: Diagnosis not present

## 2021-02-23 DIAGNOSIS — R197 Diarrhea, unspecified: Secondary | ICD-10-CM

## 2021-02-23 DIAGNOSIS — K2970 Gastritis, unspecified, without bleeding: Secondary | ICD-10-CM

## 2021-02-23 MED ORDER — DICYCLOMINE HCL 20 MG PO TABS: 20.0000 mg | ORAL_TABLET | Freq: Four times a day (QID) | ORAL | 3 refills | Status: DC

## 2021-02-23 MED ORDER — FAMOTIDINE 20 MG PO TABS: 20.0000 mg | ORAL_TABLET | Freq: Two times a day (BID) | ORAL | 3 refills | Status: DC

## 2021-02-23 NOTE — Patient Instructions (Addendum)
It was my pleasure to provide care to you today. Based on our discussion, I am providing you with my recommendations below:  RECOMMENDATION(S):   We will refill your famotidine today so that you will have this on hand in the event that your abdominal pain returns.  I also refilled your dicyclomine.  If you continue to lose weight I would recommend some lab work to monitor your electrolytes, your blood counts and your thyroid function.   FOLLOW UP:  I would like for you to follow up with me at least annually.  However I can see you anytime before that as needed.  Please call the office at [phone] to schedule your appointment.   BMI:  If you are age 16 or older, your body mass index should be between 23-30. Your Body mass index is 18.88 kg/m. If this is out of the aforementioned range listed, please consider follow up with your Primary Care Provider.  If you are age 46 or younger, your body mass index should be between 19-25. Your Body mass index is 18.88 kg/m. If this is out of the aformentioned range listed, please consider follow up with your Primary Care Provider.   MY CHART:  The [facility] providers would like to encourage you to use [facility] to communicate with providers for non-urgent requests or questions.  Due to long hold times on the telephone, sending your provider a message by [facility] may be a faster and more efficient way to get a response.  Please allow 48 business hours for a response.  Please remember that this is for non-urgent requests.   Thank you for trusting me with your gastrointestinal care!    Thornton Park, MD, MPH

## 2021-02-23 NOTE — Progress Notes (Signed)
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 History of diarrhea with acute colitis on colon biopsies, now resolved    - no chronic features or diagnostic findings of IBD on colon biopsies Appendectomy for severe appendicitis 05/07/19  Psoriatic arthritis on cosentyx    PLAN: - She is no longer using famotidine, pantoprazole, or Carafate but she will keep her script for famotidine active in the event that symptoms recur - Continue dicyclomine 20 mg QID (refilled today with 3 month supply with 3 refills) - She declined labs today including CMP, CBC, and TSH - If abdominal pain and diarrhea recur, trial of Xifaxan 550 mg TID x 14 days - Follow-up at least annually, earlier with new symptoms   HPI: Evelyn Goodwin is a 26 y.o. female who returns in scheduled follow-up.  She was last seen by Tye Savoy 05/28/20. She has acne, guttate psoriasis, and psoriatic arthritis. She had GI symptoms on Cosentyx and is now starting [employer].  She previously failed Cimzia and methotrexate along with multiple topical therapies.   She had ruptured appendicitis April  2021. She was initially treated with antibiotics, but abscess got larger and she subsequently underwent surgery on April 19.  She had significant postoperative abdominal pain and diarrhea. Diagnosed with C. Difficile.  CT scan at the time showed pancolonic wall thickening and inflammation to the rectum.  She was treated with vancomycin 125 mg 4 times  daily for 2 weeks.  Her symptoms only temporarily improved. C. difficile was positive again on June 25 and had ongoing diffuse abdominal pain and diarrhea, including nocturnal symptoms.    Colonoscopy 10/10/19 - Ulcerated mucosa in the entire examined colon. No rectal sparing. Biopsied. - The examined portion of the ileum was normal. Biopsied.  Ileal biopsies were normal. Colon biopsies showed a mildly active nonspecific colitis. The biopsies show mild edema, lymphoplasmacytosis and focal cryptitis. Features of chronicity were not seen. The pathologist noted that the differential diagnosis mainly includes infection and drug-effect.  At the time of her office visit 05/28/20 she was feeling better with trials of Bentyl and empiric doxycycline. Bowel habits had returned to normal with 1-2 formed bowel movements daily and her abdominal pain had largely resolved.   Seen 11/10/20 with a new complaint of post-prandial epigastric/upper abdominal pain and cramping. Associated nausea. No identified triggers. No further diarrhea.  No dysphagia, odynophagia, neck pain, sore throat, dysphonia, cough, blood in the stool or change in bowel habits. No change in symptoms with PPI. In speaking with her family, she is concerned that she needs to have an upper endoscopy.   EGD 11/19/20 showed gastritis. No H pylori. Esophageal and duodenal biopsies were normal. She was treated with pantoprazole 20 mg BID in addition of famotidine 20 mg Bid and Carafate.   Returns today in scheduled follow-up. She has some RUQ/epigastric fullness and early satiety.  She is about to start Clomid to get pregnant.   She has lost 5-6 pounds since  her last visit. She attributes this to increasing steps at work and a side effect of Abilify. She is working nights and has altered bowel habits. She is considering going back to days. No new GI complaints.   Past Medical History:  Diagnosis Date   Anxiety    Clostridium difficile infection     Depression    Psoriasis    Scoliosis     Past Surgical History:  Procedure Laterality Date   LAPAROSCOPIC APPENDECTOMY N/A 05/07/2019   Procedure: APPENDECTOMY LAPAROSCOPIC;  Surgeon: Georganna Skeans, MD;  Location: [place];  Service: General;  Laterality: N/A;   MOUTH SURGERY     26 year old molers removed   [place] EXTRACTION  2014    Current Outpatient Medications  Medication Sig Dispense Refill   amphetamine-dextroamphetamine (ADDERALL XR) 20 MG 24 hr capsule Take 20 mg by mouth daily as needed (to focus on work days).     ARIPiprazole (ABILIFY) 5 MG tablet Take 1 tablet by mouth daily.     buPROPion (WELLBUTRIN XL) 300 MG 24 hr tablet Take 1 tablet by mouth daily.     dicyclomine (BENTYL) 20 MG tablet Take 1 tablet (20 mg total) by mouth in the morning, at noon, in the evening, and at bedtime. 360 tablet 1   diphenhydrAMINE HCl, Sleep, (ZZZQUIL PO) Take by mouth. Take at night     famotidine (PEPCID) 20 MG tablet Take 1 tablet (20 mg total) by mouth 2 (two) times daily. 90 tablet 3   Risankizumab-rzaa (SKYRIZI) 150 MG/ML SOSY Inject 150 mg subcutaneously every 12 weeks as directed 1 mL 3   No current facility-administered medications for this visit.    Allergies as of 02/23/2021 - Review Complete 02/23/2021  Allergen Reaction Noted   Penicillin g Rash 05/10/2016   Vancomycin Rash 07/24/2019    Family History  Problem Relation Age of Onset   Psoriasis Mother    Healthy Brother    Colon cancer Neg Hx    Esophageal cancer Neg Hx    Pancreatic cancer Neg Hx    Stomach cancer Neg Hx    Rectal cancer Neg Hx      Physical Exam: General:   Alert,  well-nourished, pleasant and cooperative in NAD Head:  Normocephalic and atraumatic. Eyes:  Sclera clear, no icterus.   Conjunctiva pink. Abdomen:  Soft, nondistended, normal bowel sounds, no rebound or guarding. No hepatosplenomegaly.   Neurologic:  Alert and  oriented x4;  grossly nonfocal Skin:  Intact without  significant lesions or rashes. Psych:  Alert and cooperative. Normal mood and affect.     Jessey Huyett L. Tarri Glenn, MD, MPH 02/23/2021, 9:32 AM

## 2021-02-24 ENCOUNTER — Other Ambulatory Visit (HOSPITAL_COMMUNITY): Payer: Self-pay

## 2021-03-03 ENCOUNTER — Other Ambulatory Visit (HOSPITAL_COMMUNITY): Payer: Self-pay

## 2021-03-11 IMAGING — CT CT ABD-PELV W/ CM
2 of 5 series · 16 of 46 positions shown, 18 images · IV contrast (omnipaque)
Comparison: 05/25/2019, 05/06/2019

CLINICAL DATA: Persistent abdominal pain and tenderness, status
post appendectomy 1 month ago

EXAM:
CT ABDOMEN AND PELVIS WITH CONTRAST
TECHNIQUE: Multidetector CT imaging of the abdomen and pelvis was performed
using the standard protocol following bolus administration of
intravenous contrast.
CONTRAST:  100mL OMNIPAQUE IOHEXOL 300 MG/ML SOLN, additional oral
enteric contrast

[Series 2: axial st · axial · 0.72mm/px · z∈[-513,-93]mm · 13 of 96 slices shown, 15 images]
[im 6/96  soft-tissue]
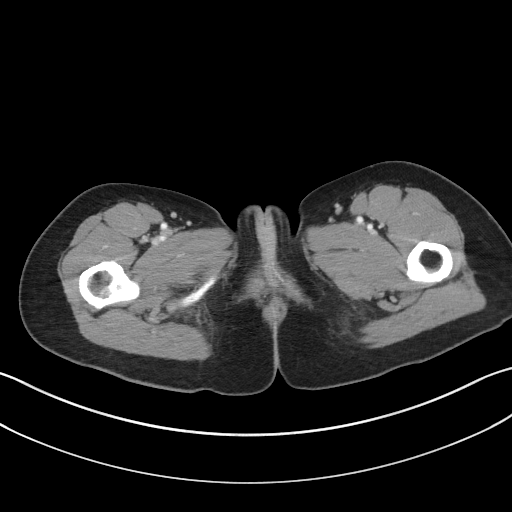
[im 6/96  bone]
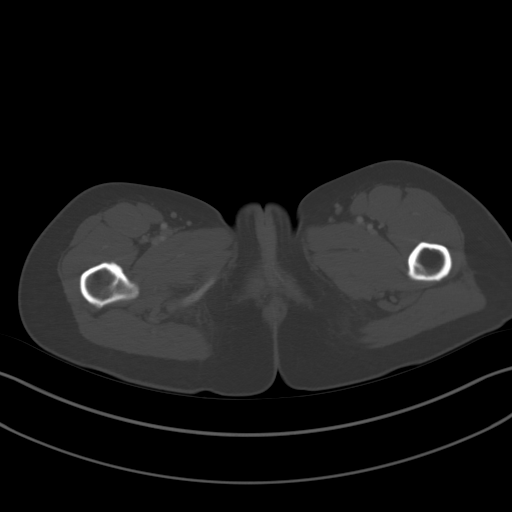
[im 12/96  soft-tissue]
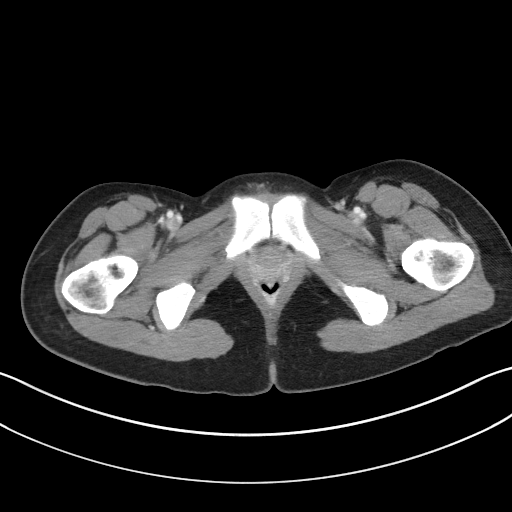
[im 18/96  soft-tissue]
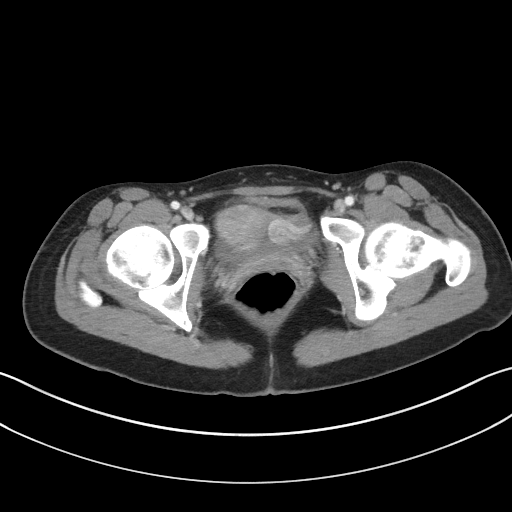
[im 30/96  soft-tissue]
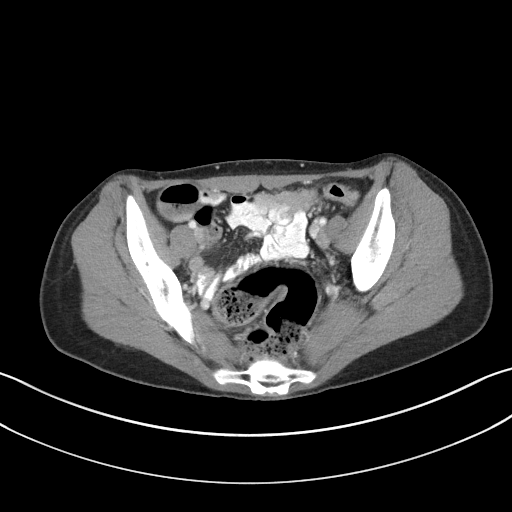
[im 36/96  soft-tissue]
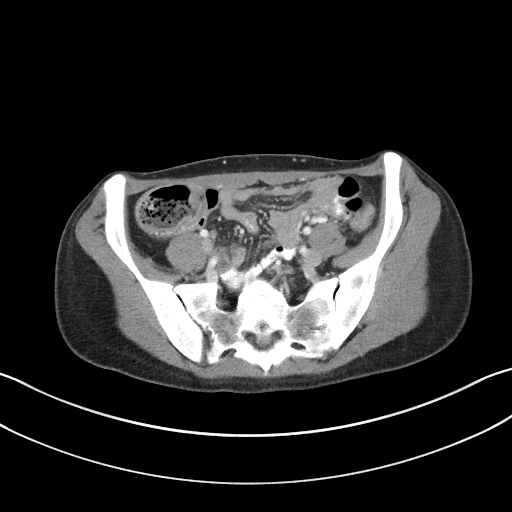
[im 42/96  soft-tissue]
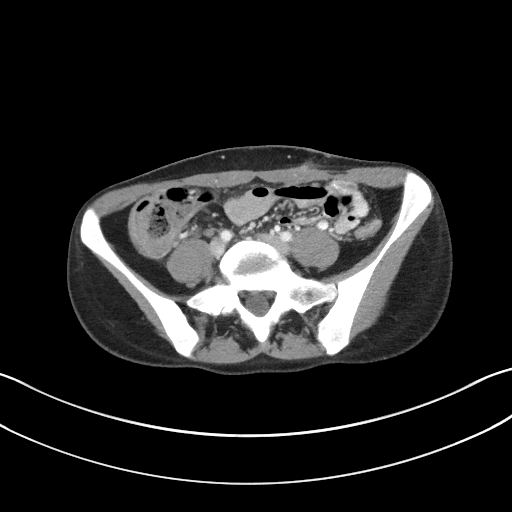
[im 48/96  soft-tissue]
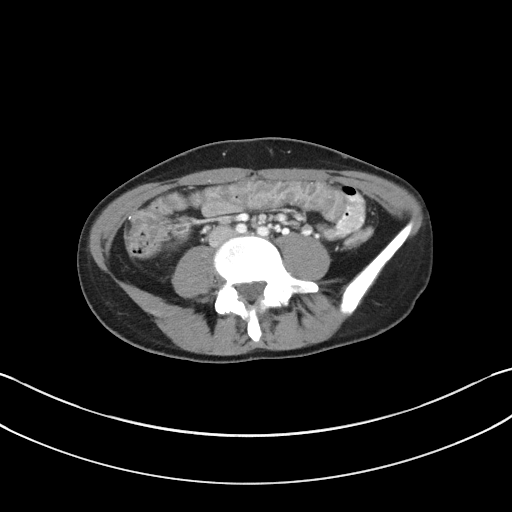
[im 54/96  soft-tissue]
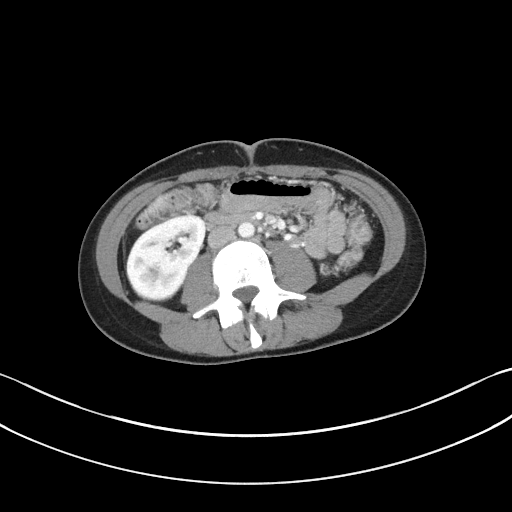
[im 60/96  soft-tissue]
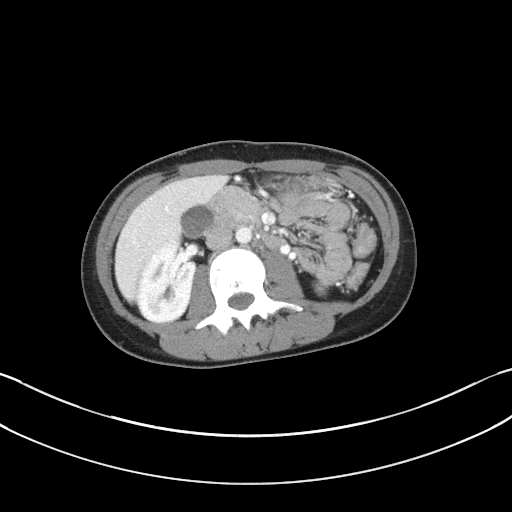
[im 60/96  bone]
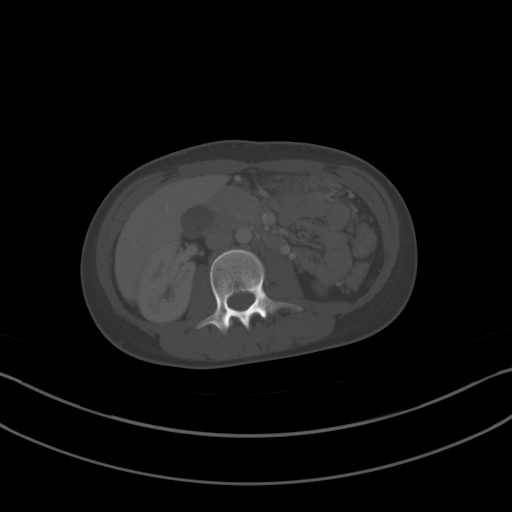
[im 66/96  soft-tissue]
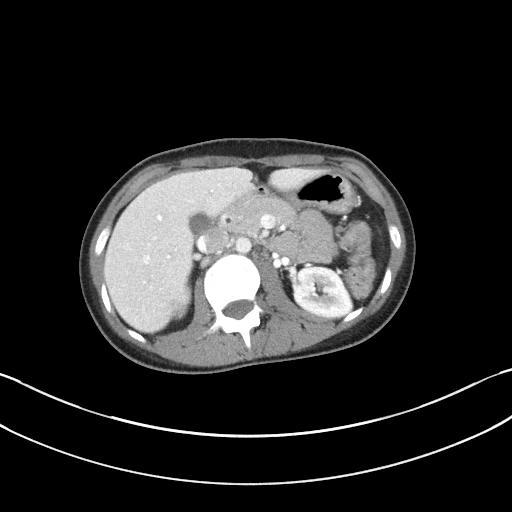
[im 78/96  soft-tissue]
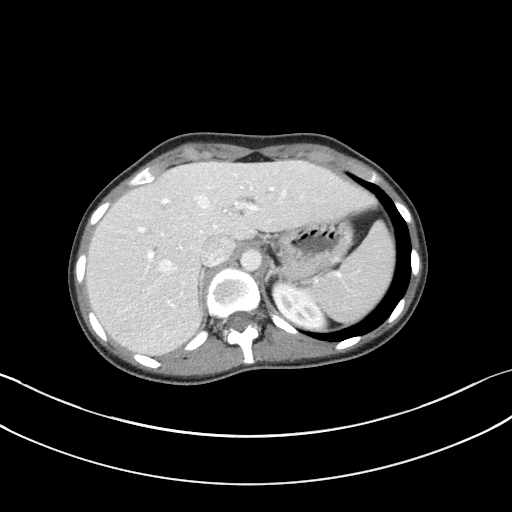
[im 84/96  soft-tissue]
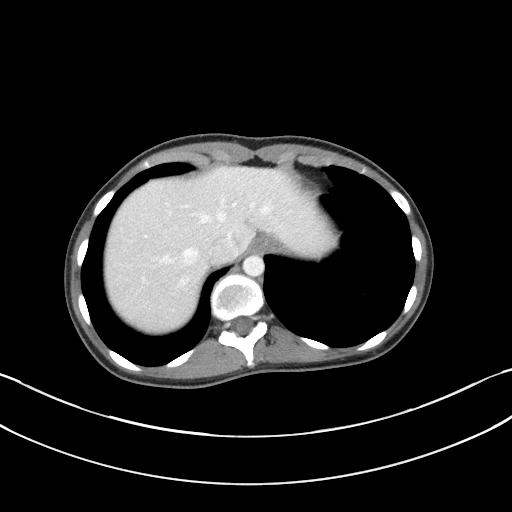
[im 90/96  soft-tissue]
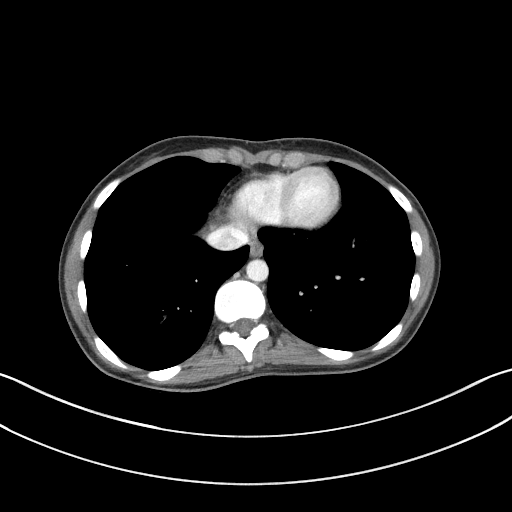

[Series 5: coronal st · coronal · 0.74mm/px · 3 of 66 slices shown]
[im 22/66  soft-tissue]
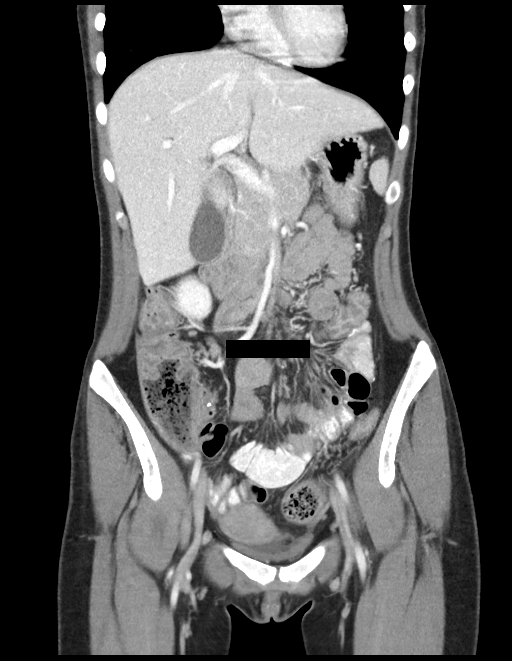
[im 29/66  soft-tissue]
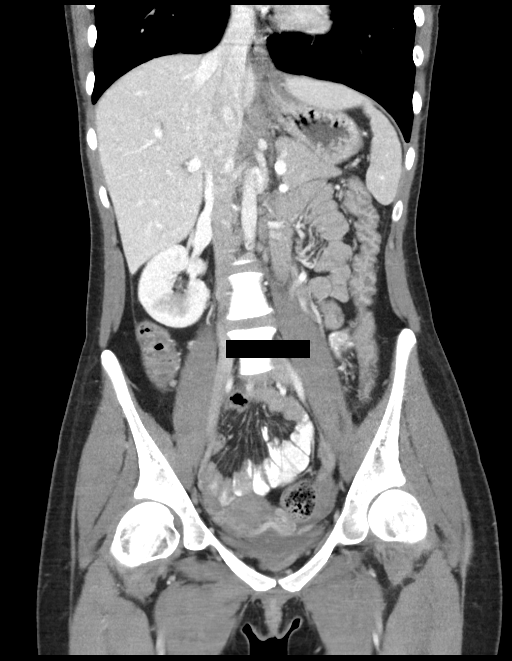
[im 37/66  soft-tissue]
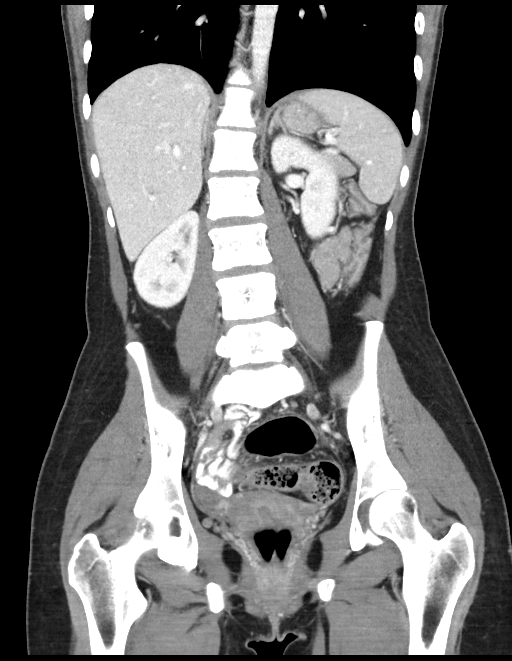

[16 of 46 positions shown; findings below may reference images not displayed]

FINDINGS: Lower chest: No acute abnormality.

Hepatobiliary: No solid liver abnormality is seen. No gallstones,
gallbladder wall thickening, or biliary dilatation.

Pancreas: Unremarkable. No pancreatic ductal dilatation or
surrounding inflammatory changes.

Spleen: Normal in size without significant abnormality.

Adrenals/Urinary Tract: Adrenal glands are unremarkable. Kidneys are
normal, without renal calculi, solid lesion, or hydronephrosis.
Bladder is unremarkable.

Stomach/Bowel: Stomach is within normal limits. Status post
appendectomy. The colon is diffusely thickened and inflamed
appearing throughout its length to the rectum. The sigmoid colon and
rectum are generally featureless appearing.

Vascular/Lymphatic: No significant vascular findings are present. No
enlarged abdominal or pelvic lymph nodes.

Reproductive: No mass or other significant abnormality.

Other: No abdominal wall hernia or abnormality. Trace free fluid in
the low pelvis.

Musculoskeletal: No acute or significant osseous findings.
IMPRESSION: 1. The colon is diffusely thickened and inflamed appearing
throughout its length to the rectum, significantly worsened compared
to prior examination. The sigmoid colon and rectum are generally
featureless appearing. Findings are consistent with nonspecific
infectious or inflammatory colitis, although this appearance and
distribution is concerning for ulcerative colitis.
2. Trace free fluid in the low pelvis, likely reactive.
3. No evidence of intra-abdominal fluid collection given history of
recent appendectomy.

## 2021-04-10 ENCOUNTER — Other Ambulatory Visit (HOSPITAL_COMMUNITY): Payer: Self-pay

## 2021-04-27 ENCOUNTER — Encounter: Payer: Self-pay | Admitting: Dermatology

## 2021-04-27 DIAGNOSIS — L409 Psoriasis, unspecified: Secondary | ICD-10-CM

## 2021-04-27 DIAGNOSIS — L4050 Arthropathic psoriasis, unspecified: Secondary | ICD-10-CM

## 2021-04-29 ENCOUNTER — Other Ambulatory Visit (HOSPITAL_COMMUNITY): Payer: Self-pay

## 2021-04-29 ENCOUNTER — Other Ambulatory Visit: Payer: Self-pay | Admitting: Pharmacist

## 2021-04-29 DIAGNOSIS — L409 Psoriasis, unspecified: Secondary | ICD-10-CM

## 2021-04-29 DIAGNOSIS — L4050 Arthropathic psoriasis, unspecified: Secondary | ICD-10-CM

## 2021-04-29 MED ORDER — SKYRIZI PEN 150 MG/ML ~~LOC~~ SOAJ
150.0000 mg | SUBCUTANEOUS | 3 refills | Status: DC
  Filled 2021-04-29: qty 1, fill #0

## 2021-04-29 MED ORDER — SKYRIZI PEN 150 MG/ML ~~LOC~~ SOAJ
150.0000 mg | SUBCUTANEOUS | 3 refills | Status: DC
  Filled 2021-04-29: qty 1, fill #0
  Filled 2021-05-01: qty 1, 84d supply, fill #0
  Filled 2021-07-20: qty 1, 84d supply, fill #1
  Filled 2021-10-08 – 2022-04-29 (×5): qty 1, 84d supply, fill #2

## 2021-04-30 ENCOUNTER — Telehealth: Payer: Self-pay | Admitting: *Deleted

## 2021-04-30 NOTE — Telephone Encounter (Signed)
Called patient to advise her of dr tafeens advice. She stated that she see her OBGYN on Monday and will discuss options at that visit  ?

## 2021-05-01 ENCOUNTER — Other Ambulatory Visit (HOSPITAL_COMMUNITY): Payer: Self-pay

## 2021-05-04 ENCOUNTER — Other Ambulatory Visit (HOSPITAL_COMMUNITY): Payer: Self-pay

## 2021-05-05 ENCOUNTER — Other Ambulatory Visit (HOSPITAL_COMMUNITY): Payer: Self-pay

## 2021-05-06 ENCOUNTER — Other Ambulatory Visit (HOSPITAL_COMMUNITY): Payer: Self-pay

## 2021-05-07 ENCOUNTER — Other Ambulatory Visit (HOSPITAL_COMMUNITY): Payer: Self-pay

## 2021-05-29 ENCOUNTER — Other Ambulatory Visit (HOSPITAL_COMMUNITY): Payer: Self-pay

## 2021-06-23 ENCOUNTER — Telehealth: Payer: Self-pay | Admitting: Cardiology

## 2021-06-23 NOTE — Telephone Encounter (Signed)
Patient c/o Palpitations:  High priority if patient c/o lightheadedness, shortness of breath, or chest pain  How long have you had palpitations/irregular HR/ Afib? About a week off and on   Are you having the symptoms now? no  Are you currently experiencing lightheadedness, SOB or CP? no  Do you have a history of afib (atrial fibrillation) or irregular heart rhythm? yes  Have you checked your BP or HR? (document readings if available):   Are you experiencing any other symptoms? No   Patient would just like to come in and get checked out

## 2021-06-23 NOTE — Telephone Encounter (Signed)
Left message to call back  

## 2021-06-25 NOTE — Telephone Encounter (Signed)
Left message for pt to call.

## 2021-07-06 NOTE — Progress Notes (Unsigned)
Office Visit    Patient Name: Evelyn Goodwin Date of Encounter: 07/07/2021  Primary Care Provider:  Heywood Bene, PA-C Primary Cardiologist:  Kirk Ruths, MD  Chief Complaint    26 year old female with a history of palpitations, psoriasis, scoliosis, anxiety and depression who presents for follow-up related to palpitations.  Past Medical History    Past Medical History:  Diagnosis Date   Anxiety    Clostridium difficile infection    Depression    Psoriasis    Scoliosis    Past Surgical History:  Procedure Laterality Date   LAPAROSCOPIC APPENDECTOMY N/A 05/07/2019   Procedure: APPENDECTOMY LAPAROSCOPIC;  Surgeon: Georganna Skeans, MD;  Location: [facility];  Service: General;  Laterality: N/A;   MOUTH SURGERY     26 year old molers removed   WISDOM TOOTH EXTRACTION  2014    Allergies  Allergies  Allergen Reactions   Penicillin G Rash   Vancomycin Rash    History of Present Illness    26 year old female with the above past medical history including palpitations, psoriasis, scoliosis, anxiety and depression.  She was previously evaluated by Dr. Stanford Breed in 2021 in the setting of palpitations.  She described intermittent palpitations where she felt her "heart racing" for 5 to 8 seconds at a time, no associated symptoms.    Echocardiogram in February 2021 showed EF 60 to 65%, normal LV function, no RWMA, no valvular abnormalities.  She was last seen in the office on 03/12/2019 reported worsening palpitations. She was started on metoprolol. Outpatient cardiac monitor in March 2021 showed occasional PVCs, 3 beats of NSVT.  She has not been seen in the office since.  She contacted our office on 06/23/2021 with complaints of worsening palpitations.  She presents today for follow-up accompanied by her friend.  Since her last visit, and since she contacted our office she has been stable overall from a cardiac standpoint though she does report ongoing increased  palpitations.  She has palpitations that last for seconds at a time and occur a few times a day.  She states she feels like "the bottom chambers of my heart are shaking."  No associated symptoms.  She has not been taking her metoprolol, in fact, she states she never started taking it.  She denies increased in personal stress, though she does states she has baseline anxiety and takes Wellbutrin as needed as well as as needed Xanax. Additionally, she recently went through a break-up.  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  She denies increased caffeine intake.  Other than her palpitations, she denies any additional concerns today.  Home Medications    Current Outpatient Medications  Medication Sig Dispense Refill   buPROPion (WELLBUTRIN XL) 300 MG 24 hr tablet Take 1 tablet by mouth daily.     dicyclomine (BENTYL) 20 MG tablet Take 1 tablet (20 mg total) by mouth in the morning, at noon, in the evening, and at bedtime. 360 tablet 3   diphenhydrAMINE HCl, Sleep, (ZZZQUIL PO) Take by mouth. Take at night     famotidine (PEPCID) 20 MG tablet Take 1 tablet (20 mg total) by mouth 2 (two) times daily. 180 tablet 3   Risankizumab-rzaa (SKYRIZI PEN) 150 MG/ML SOAJ Inject 150 mg into the skin as directed. Every 12 weeks for maintenance. 1 mL 3   No current facility-administered medications for this visit.     Review of Systems    She denies chest pain, dyspnea, pnd,  orthopnea, n, v, dizziness, syncope, edema, weight gain, or early satiety. All other systems reviewed and are otherwise negative except as noted above.   Physical Exam    VS:  BP 110/84   Pulse 91   Ht 5' 7"  (1.702 m)   Wt 118 lb (53.5 kg)   SpO2 100%   BMI 18.48 kg/m   GEN: Well nourished, well developed, in no acute distress. HEENT: normal. Neck: Supple, no JVD, carotid bruits, or masses. Cardiac: RRR, no murmurs, rubs, or gallops. No clubbing, cyanosis, edema.  Radials/DP/PT 2+ and equal bilaterally.   Respiratory:  Respirations regular and unlabored, clear to auscultation bilaterally. GI: Soft, nontender, nondistended, BS + x 4. MS: no deformity or atrophy. Skin: warm and dry, no rash. Neuro:  Strength and sensation are intact. Psych: Normal affect.  Accessory Clinical Findings    ECG personally reviewed by me today -sinus rhythm with marked sinus arrhythmia, 91 bpm- no acute changes.  Lab Results  Component Value Date   WBC 17.6 (H) 07/22/2019   HGB 13.0 07/22/2019   HCT 40.2 07/22/2019   MCV 84.5 07/22/2019   PLT 563 (H) 07/22/2019   Lab Results  Component Value Date   CREATININE 0.63 07/26/2019   BUN 18 07/26/2019   NA 138 07/26/2019   K 3.8 07/26/2019   CL 102 07/26/2019   CO2 26 07/26/2019   Lab Results  Component Value Date   ALT 12 07/22/2019   AST 18 07/22/2019   ALKPHOS 68 07/22/2019   BILITOT 0.3 07/22/2019   No results found for: "CHOL", "HDL", "LDLCALC", "LDLDIRECT", "TRIG", "CHOLHDL"  No results found for: "HGBA1C"  Assessment & Plan   1. Palpitations/NSVT:  Echo in February 2021 showed EF 60 to 65%, normal LV function, no RWMA, no valvular abnormalities. Outpatient cardiac monitor in March 2021 showed occasional PVCs, 3 beats of NSVT.  She notes an increase in frequency of palpitations over the past few weeks where she feels like "the bottom chambers of [her] heart are shaking."  No associated symptoms.  These episodes last for seconds at a time and occur a few times a day.  She denies any recent increase in stress, denies increased caffeine intake.  Will repeat 14-day Zio patch.  She never started taking her metoprolol as previously prescribed.  Will start metoprolol succinate 25 mg daily.  Will check BMET, TSH, Mg.  Discussed ED precautions.  Start metoprolol as above.  2. Psoriasis: Managed per primary care.  3. Anxiety and depression: She is only taking her Wellbutrin as needed.  Encouraged her that this is a daily medication and should be taken  regularly for greatest benefit. Additionally, she takes Xanax prn.  Advised her that uncontrolled anxiety could contribute to increased palpitations.  Discussed continued monitoring of anxiety symptoms and follow-up with PCP as needed.  4. Disposition: Follow-up in 6-8 weeks.   Lenna Sciara, NP 07/07/2021, 8:00 AM

## 2021-07-07 ENCOUNTER — Ambulatory Visit (INDEPENDENT_AMBULATORY_CARE_PROVIDER_SITE_OTHER): Payer: 59 | Admitting: Nurse Practitioner

## 2021-07-07 ENCOUNTER — Ambulatory Visit (INDEPENDENT_AMBULATORY_CARE_PROVIDER_SITE_OTHER): Payer: 59

## 2021-07-07 ENCOUNTER — Encounter: Payer: Self-pay | Admitting: Nurse Practitioner

## 2021-07-07 VITALS — BP 110/84 | HR 91 | Ht 67.0 in | Wt 118.0 lb

## 2021-07-07 DIAGNOSIS — R002 Palpitations: Secondary | ICD-10-CM | POA: Diagnosis not present

## 2021-07-07 DIAGNOSIS — Z79899 Other long term (current) drug therapy: Secondary | ICD-10-CM | POA: Diagnosis not present

## 2021-07-07 DIAGNOSIS — F32A Depression, unspecified: Secondary | ICD-10-CM

## 2021-07-07 DIAGNOSIS — F419 Anxiety disorder, unspecified: Secondary | ICD-10-CM

## 2021-07-07 DIAGNOSIS — L409 Psoriasis, unspecified: Secondary | ICD-10-CM | POA: Diagnosis not present

## 2021-07-07 DIAGNOSIS — I4729 Other ventricular tachycardia: Secondary | ICD-10-CM

## 2021-07-07 MED ORDER — METOPROLOL SUCCINATE ER 25 MG PO TB24: 25.0000 mg | ORAL_TABLET | Freq: Every day | ORAL | 3 refills | Status: DC

## 2021-07-07 NOTE — Progress Notes (Unsigned)
Enrolled for [employer] to mail a ZIO XT long term holter monitor to the patients address on file.   Dr. Stanford Breed to read.

## 2021-07-07 NOTE — Telephone Encounter (Signed)
Seen in office today by Diona Browner

## 2021-07-07 NOTE — Patient Instructions (Signed)
Medication Instructions:  Metoprolol 25 mg daily   *If you need a refill on your cardiac medications before your next appointment, please call your pharmacy*   Lab Work: Your physician recommends that you complete labs today BMET TSH Magnesium  If you have labs (blood work) drawn today and your tests are completely normal, you will receive your results only by: [place] (if you have MyChart) OR A paper copy in the mail If you have any lab test that is abnormal or we need to change your treatment, we will call you to review the results.   Testing/Procedures: [place]- Long Term Monitor Instructions  Your physician has requested you wear a ZIO patch monitor for 14 days.  This is a single patch monitor. [employer] supplies one patch monitor per enrollment. Additional stickers are not available. Please do not apply patch if you will be having a Nuclear Stress Test,  Echocardiogram, Cardiac CT, MRI, or Chest Xray during the period you would be wearing the  monitor. The patch cannot be worn during these tests. You cannot remove and re-apply the  ZIO XT patch monitor.  Your ZIO patch monitor will be mailed 3 day USPS to your address on file. It may take 3-5 days  to receive your monitor after you have been enrolled.  Once you have received your monitor, please review the enclosed instructions. Your monitor  has already been registered assigning a specific monitor serial # to you.  Billing and Patient Assistance Program Information  We have supplied [employer] with any of your insurance information on file for billing purposes. [employer] offers a sliding scale Patient Assistance Program for patients that do not have  insurance, or whose insurance does not completely cover the cost of the ZIO monitor.  You must apply for the Patient Assistance Program to qualify for this discounted rate.  To apply, please call [employer] at [phone], select option 4, select option 2, ask to apply for   Patient Assistance Program. Theodore Demark will ask your household income, and how many people  are in your household. They will quote your out-of-pocket cost based on that information.  [employer] will also be able to set up a 89-month interest-free payment plan if needed.  Applying the monitor   Shave hair from upper left chest.  Hold abrader disc by orange tab. Rub abrader in 40 strokes over the upper left chest as  indicated in your monitor instructions.  Clean area with 4 enclosed alcohol pads. Let dry.  Apply patch as indicated in monitor instructions. Patch will be placed under collarbone on left  side of chest with arrow pointing upward.  Rub patch adhesive wings for 2 minutes. Remove white label marked "1". Remove the white  label marked "2". Rub patch adhesive wings for 2 additional minutes.  While looking in a mirror, press and release button in center of patch. A small green light will  flash 3-4 times. This will be your only indicator that the monitor has been turned on.  Do not shower for the first 24 hours. You may shower after the first 24 hours.  Press the button if you feel a symptom. You will hear a small click. Record Date, Time and  Symptom in the Patient Logbook.  When you are ready to remove the patch, follow instructions on the last 2 pages of Patient  Logbook. Stick patch monitor onto the last page of Patient Logbook.  Place Patient Logbook in the blue and white box. Use locking  tab on box and tape box closed  securely. The blue and white box has prepaid postage on it. Please place it in the mailbox as  soon as possible. Your physician should have your test results approximately 7 days after the  monitor has been mailed back to [facility].  Call [employer] at [phone] if you have questions regarding  your ZIO XT patch monitor. Call them immediately if you see an orange light blinking on your  monitor.  If your monitor falls off in less than 4  days, contact our Monitor department at [phone].  If your monitor becomes loose or falls off after 4 days call [employer] at [phone] for  suggestions on securing your monitor    Follow-Up: At [facility], you and your health needs are our priority.  As part of our continuing mission to provide you with exceptional heart care, we have created designated Provider Care Teams.  These Care Teams include your primary Cardiologist (physician) and Advanced Practice Providers (APPs -  Physician Assistants and Nurse Practitioners) who all work together to provide you with the care you need, when you need it.  We recommend signing up for the patient portal called "MyChart".  Sign up information is provided on this After Visit Summary.  MyChart is used to connect with patients for Virtual Visits (Telemedicine).  Patients are able to view lab/test results, encounter notes, upcoming appointments, etc.  Non-urgent messages can be sent to your provider as well.   To learn more about what you can do with MyChart, go to [email or web address].    Your next appointment:   6-8 week(s)  The format for your next appointment:   In Person  Provider:   Diona Browner, NP        Other Instructions   Important Information About Sugar

## 2021-07-08 ENCOUNTER — Telehealth: Payer: Self-pay

## 2021-07-08 LAB — BASIC METABOLIC PANEL
BUN/Creatinine Ratio: 23 (ref 9–23)
BUN: 14 mg/dL (ref 6–20)
CO2: 26 mmol/L (ref 20–29)
Calcium: 9.7 mg/dL (ref 8.7–10.2)
Chloride: 100 mmol/L (ref 96–106)
Creatinine, Ser: 0.6 mg/dL (ref 0.57–1.00)
Glucose: 83 mg/dL (ref 70–99)
Potassium: 4.4 mmol/L (ref 3.5–5.2)
Sodium: 137 mmol/L (ref 134–144)
eGFR: 128 mL/min/{1.73_m2} (ref >59)

## 2021-07-08 LAB — MAGNESIUM: Magnesium: 1.9 mg/dL (ref 1.6–2.3)

## 2021-07-08 LAB — TSH: TSH: 1.91 u[IU]/mL (ref 0.450–4.500)

## 2021-07-08 NOTE — Telephone Encounter (Signed)
Spoke with pt. Pt was notified of lab results and will follow up as planned.  

## 2021-07-10 DIAGNOSIS — R002 Palpitations: Secondary | ICD-10-CM

## 2021-07-13 ENCOUNTER — Ambulatory Visit (INDEPENDENT_AMBULATORY_CARE_PROVIDER_SITE_OTHER): Payer: 59 | Admitting: Dermatology

## 2021-07-13 ENCOUNTER — Encounter: Payer: Self-pay | Admitting: Dermatology

## 2021-07-13 DIAGNOSIS — L400 Psoriasis vulgaris: Secondary | ICD-10-CM

## 2021-07-13 NOTE — Progress Notes (Signed)
Patient is going to need a TB test before any refills. She is going to call her Dr. To see about getting [place] a copy of her recent results.

## 2021-07-15 ENCOUNTER — Telehealth: Payer: Self-pay | Admitting: Dermatology

## 2021-07-15 DIAGNOSIS — L400 Psoriasis vulgaris: Secondary | ICD-10-CM

## 2021-07-15 DIAGNOSIS — Z79899 Other long term (current) drug therapy: Secondary | ICD-10-CM

## 2021-07-15 NOTE — Telephone Encounter (Signed)
Phone call to patient to inform her that we didn't send in her TB Gold order during her visit because she told us she was going to have her other Doctor order the test.  Patient aware, patient states that when she left her visit her other doctor told her they couldn't send in the order for her. Patient states that she forgot to call us and tell us that. I informed patient I would send in the order.

## 2021-07-15 NOTE — Telephone Encounter (Signed)
Patient left message on office voice mail that she went to [facility] today to do labs but said that [facility] did not have the test orders for Quantiferon TB Gold and needs order sent before the end of the day.

## 2021-07-18 LAB — QUANTIFERON-TB GOLD PLUS
Mitogen-NIL: 9.84 IU/mL
NIL: 0.02 IU/mL
QuantiFERON-TB Gold Plus: NEGATIVE
TB1-NIL: 0 IU/mL
TB2-NIL: 0 IU/mL

## 2021-07-20 ENCOUNTER — Other Ambulatory Visit (HOSPITAL_COMMUNITY): Payer: Self-pay

## 2021-08-09 ENCOUNTER — Encounter: Payer: Self-pay | Admitting: Dermatology

## 2021-08-09 NOTE — Progress Notes (Signed)
   Follow-Up Visit   Subjective  Evelyn Goodwin is a 26 y.o. female who presents for the following: Psoriasis (Follow up on [place] and she is completely clear, patient stated 1 week out from injection she has joint pain hands and arms. She is no longer trying to get pregnant ).  Psoriasis follow-up, clear on Skyrizi Location:  Duration:  Quality:  Associated Signs/Symptoms: Modifying Factors:  Severity:  Timing: Context:   Objective  Well appearing patient in no apparent distress; mood and affect are within normal limits. Patient quite pleased with Skyrizi results.  Minimal out-of-pocket expense, skin generally 100% clear, no signs of psoriatic arthritis.  I reviewed in detail normally updated information about Skyrizi including its safety during the COVID pandemic, but also other options including the new Sotyktu and Vtama.    A focused examination was performed including head, neck, back, arms, legs, nails, joints.. Relevant physical exam findings are noted in the Assessment and Plan.   Assessment & Plan    Plaque psoriasis  Patient's skin is clear on skyrizi. Follow up in 1 year.  Update TB status.      I, [name], MD, have reviewed all documentation for this visit.  The documentation on 08/09/21 for the exam, diagnosis, procedures, and orders are all accurate and complete.

## 2021-08-13 ENCOUNTER — Other Ambulatory Visit (HOSPITAL_COMMUNITY): Payer: Self-pay

## 2021-08-13 ENCOUNTER — Telehealth: Payer: Self-pay

## 2021-08-13 NOTE — Telephone Encounter (Signed)
Lmom to discuss cardiac monitor results. Waiting on a return call.

## 2021-08-17 ENCOUNTER — Other Ambulatory Visit (HOSPITAL_COMMUNITY): Payer: Self-pay

## 2021-08-25 NOTE — Progress Notes (Unsigned)
Office Visit    Patient Name: Evelyn Goodwin Date of Encounter: 08/26/2021  Primary Care Provider:  Heywood Bene, PA-C Primary Cardiologist:  Kirk Ruths, MD  Chief Complaint    26 year old female with a history of palpitations, psoriasis, scoliosis, anxiety and depression who presents for follow-up related to palpitations.  Past Medical History    Past Medical History:  Diagnosis Date   Anxiety    Clostridium difficile infection    Depression    Psoriasis    Scoliosis    Past Surgical History:  Procedure Laterality Date   LAPAROSCOPIC APPENDECTOMY N/A 05/07/2019   Procedure: APPENDECTOMY LAPAROSCOPIC;  Surgeon: Georganna Skeans, MD;  Location: [facility];  Service: General;  Laterality: N/A;   MOUTH SURGERY     26 year old molers removed   WISDOM TOOTH EXTRACTION  2014    Allergies  Allergies  Allergen Reactions   Penicillin G Rash   Vancomycin Rash    History of Present Illness    26 year old female with the above past medical history including palpitations, psoriasis, scoliosis, anxiety and depression.   She was previously evaluated by Dr. Stanford Breed in 2021 in the setting of palpitations.  She described intermittent palpitations where she felt her "heart racing" for 5 to 8 seconds at a time, no associated symptoms. Echocardiogram in February 2021 showed EF 60 to 65%, normal LV function, no RWMA, no valvular abnormalities.  At her follow-up visit in February 2021 she reported worsening palpitations. She was started on metoprolol. Outpatient cardiac monitor in March 2021 showed occasional PVCs, 3 beats of NSVT.    She was last seen in the office on 07/07/2021 ad reported ongoing increased palpitations. She reported feeling like "the bottom chambers of my heart [were] shaking."  No associated symptoms.  She never started taking her metoprolol.  Repeat cardiac monitor showed predominantly sinus rhythm, sinus tachycardia, occasional PACs and PVCs, 3 beats of NSVT.   She presents today for follow-up.  Since her last visit she reports ongoing palpitations.  She states they occur mostly at nighttime however, they continue to bother her.  She thinks that her metoprolol is not very effective in controlling her palpitations and is asking about alternative medications.  She states she did not start taking her Wellbutrin as she was told by her PCP that this could affect her blood pressure and heart rate nation with metoprolol.  She is concerned that she continues to have "frequent palpitations."  Home Medications    Current Outpatient Medications  Medication Sig Dispense Refill   buPROPion (WELLBUTRIN XL) 300 MG 24 hr tablet Take 1 tablet by mouth daily.     dicyclomine (BENTYL) 20 MG tablet Take 1 tablet (20 mg total) by mouth in the morning, at noon, in the evening, and at bedtime. 360 tablet 3   diltiazem (CARDIZEM CD) 120 MG 24 hr capsule Take 1 capsule (120 mg total) by mouth daily. 90 capsule 3   diphenhydrAMINE HCl, Sleep, (ZZZQUIL PO) Take by mouth. Take at night     famotidine (PEPCID) 20 MG tablet Take 1 tablet (20 mg total) by mouth 2 (two) times daily. 180 tablet 3   Risankizumab-rzaa (SKYRIZI PEN) 150 MG/ML SOAJ Inject 150 mg into the skin as directed. Every 12 weeks for maintenance. 1 mL 3   ALPRAZolam (XANAX) 0.25 MG tablet Take 0.25 mg by mouth 3 (three) times daily as needed.     No current facility-administered medications for this visit.     Review  of Systems    She denies chest pain, dyspnea, pnd, orthopnea, n, v, dizziness, syncope, edema, weight gain, or early satiety. All other systems reviewed and are otherwise negative except as noted above.   Physical Exam    VS:  BP 104/66   Pulse 70   Ht 5' 7.5" (1.715 m)   Wt 120 lb 9.6 oz (54.7 kg)   SpO2 98%   BMI 18.61 kg/m  GEN: Well nourished, well developed, in no acute distress. HEENT: normal. Neck: Supple, no JVD, carotid bruits, or masses. Cardiac: RRR, no murmurs, rubs, or gallops.  No clubbing, cyanosis, edema.  Radials/DP/PT 2+ and equal bilaterally.  Respiratory:  Respirations regular and unlabored, clear to auscultation bilaterally. GI: Soft, nontender, nondistended, BS + x 4. MS: no deformity or atrophy. Skin: warm and dry, no rash. Neuro:  Strength and sensation are intact. Psych: Normal affect.  Accessory Clinical Findings    ECG personally reviewed by me today -NSR, 69 bpm- no acute changes.  Lab Results  Component Value Date   WBC 17.6 (H) 07/22/2019   HGB 13.0 07/22/2019   HCT 40.2 07/22/2019   MCV 84.5 07/22/2019   PLT 563 (H) 07/22/2019   Lab Results  Component Value Date   CREATININE 0.60 07/07/2021   BUN 14 07/07/2021   NA 137 07/07/2021   K 4.4 07/07/2021   CL 100 07/07/2021   CO2 26 07/07/2021   Lab Results  Component Value Date   ALT 12 07/22/2019   AST 18 07/22/2019   ALKPHOS 68 07/22/2019   BILITOT 0.3 07/22/2019   No results found for: "CHOL", "HDL", "LDLCALC", "LDLDIRECT", "TRIG", "CHOLHDL"  No results found for: "HGBA1C"  Assessment & Plan    1. Palpitations/NSVT:  Echo in February 2021 showed EF 60 to 65%, normal LV function, no RWMA, no valvular abnormalities. Outpatient cardiac monitor in March 2021 showed occasional PVCs, 3 beats of NSVT. Recently reported increased frequency of palpitations. No associated symptoms. Repeat 14-day Zio patch showed predominantly sinus rhythm, sinus tachycardia, occasional PACs and PVCs, 3 beats of NSVT.  She was advised to take metoprolol succinate 25 mg daily.  Echo and monitor overall reassuring.  However, she reports no improvement in symptoms on metoprolol. She is asking about alternative therapies. Will stop metoprolol and start diltiazem 120 mg daily.  Discussed lifestyle modifications (she works night shift).  She states she is soon switching to dayshift.  Continue diltiazem as above, continue to monitor symptoms.   2. Psoriasis: Managed per primary care.   3. Anxiety and depression:  She did not start taking her Wellbutrin-she states her PCP told her that it would lower her heart rate and blood pressure.  Advised her that uncontrolled anxiety could contribute to increased palpitations.  Discussed continued monitoring of anxiety symptoms, continue Wellbutrin as prescribed, and follow-up with PCP as needed.   4. Disposition: Follow-up in 3 months.   Lenna Sciara, NP 08/26/2021, 4:15 PM

## 2021-08-26 ENCOUNTER — Other Ambulatory Visit: Payer: Self-pay

## 2021-08-26 ENCOUNTER — Other Ambulatory Visit (HOSPITAL_COMMUNITY): Payer: Self-pay

## 2021-08-26 ENCOUNTER — Ambulatory Visit (INDEPENDENT_AMBULATORY_CARE_PROVIDER_SITE_OTHER): Payer: 59 | Admitting: Nurse Practitioner

## 2021-08-26 ENCOUNTER — Encounter: Payer: Self-pay | Admitting: Nurse Practitioner

## 2021-08-26 ENCOUNTER — Telehealth: Payer: Self-pay | Admitting: Pharmacy Technician

## 2021-08-26 ENCOUNTER — Telehealth: Payer: Self-pay | Admitting: Gastroenterology

## 2021-08-26 VITALS — BP 104/66 | HR 70 | Ht 67.5 in | Wt 120.6 lb

## 2021-08-26 DIAGNOSIS — R002 Palpitations: Secondary | ICD-10-CM | POA: Diagnosis not present

## 2021-08-26 DIAGNOSIS — F419 Anxiety disorder, unspecified: Secondary | ICD-10-CM | POA: Diagnosis not present

## 2021-08-26 DIAGNOSIS — L409 Psoriasis, unspecified: Secondary | ICD-10-CM

## 2021-08-26 DIAGNOSIS — I4729 Other ventricular tachycardia: Secondary | ICD-10-CM | POA: Diagnosis not present

## 2021-08-26 DIAGNOSIS — F32A Depression, unspecified: Secondary | ICD-10-CM

## 2021-08-26 MED ORDER — DICYCLOMINE HCL 20 MG PO TABS: 20.0000 mg | ORAL_TABLET | Freq: Four times a day (QID) | ORAL | 3 refills | Status: DC

## 2021-08-26 MED ORDER — DILTIAZEM HCL ER COATED BEADS 120 MG PO CP24: 120.0000 mg | ORAL_CAPSULE | Freq: Every day | ORAL | 3 refills | Status: DC

## 2021-08-26 MED ORDER — RIFAXIMIN 550 MG PO TABS: 550.0000 mg | ORAL_TABLET | Freq: Three times a day (TID) | ORAL | 0 refills | Status: AC

## 2021-08-26 NOTE — Patient Instructions (Signed)
Medication Instructions:  STOP Metoprolol as directed START Diltiazem 120 mg one tab daily  *If you need a refill on your cardiac medications before your next appointment, please call your pharmacy*   Lab Work: NONE ordered at this time of appointment   If you have labs (blood work) drawn today and your tests are completely normal, you will receive your results only by: [place] (if you have MyChart) OR A paper copy in the mail If you have any lab test that is abnormal or we need to change your treatment, we will call you to review the results.   Testing/Procedures: NONE ordered at this time of appointment     Follow-Up: At [facility], you and your health needs are our priority.  As part of our continuing mission to provide you with exceptional heart care, we have created designated Provider Care Teams.  These Care Teams include your primary Cardiologist (physician) and Advanced Practice Providers (APPs -  Physician Assistants and Nurse Practitioners) who all work together to provide you with the care you need, when you need it.  We recommend signing up for the patient portal called "MyChart".  Sign up information is provided on this After Visit Summary.  MyChart is used to connect with patients for Virtual Visits (Telemedicine).  Patients are able to view lab/test results, encounter notes, upcoming appointments, etc.  Non-urgent messages can be sent to your provider as well.   To learn more about what you can do with MyChart, go to [email or web address].    Your next appointment:   3 month(s)  The format for your next appointment:   In Person  Provider:   Kirk Ruths, MD  or Diona Browner, NP        Other Instructions   Important Information About Sugar

## 2021-08-26 NOTE — Telephone Encounter (Signed)
Patient Advocate Encounter  Prior Authorization for Evelyn Goodwin has been approved.    PA# [id number] Effective dates: 8.9.23 through 10.08.23  Mariann Palo B. CPhT P: [phone] F: [phone]

## 2021-08-26 NOTE — Telephone Encounter (Signed)
Patient called in with complaints of intermittent, generalized abdominal pain & cramping (3/10). Denies n/v. Normal bowel movements. She has tried cutting out some dairy since she was last here for OV in 02/2021 which may have helped some, but not entirely. She is currently taking pepcid PRN, and no longer has any more refills for dicyclomine. Pt also states that she will be starting a new job soon, and is unsure of her insurance coverage & availability, and would like to know if a virtual visit would be possible if she needs a follow up with Dr. Tarri Glenn. Will route to MD for recommendations.

## 2021-08-26 NOTE — Telephone Encounter (Signed)
Patient requested that MD recommendations be sent to mychart. Refer to patient message.

## 2021-08-26 NOTE — Telephone Encounter (Signed)
Patient called would like to speak with a nurse regarding upper abdominal pain seeking advise.

## 2021-08-26 NOTE — Telephone Encounter (Signed)
Patient Advocate Encounter  Received notification from [place] that prior authorization for [facility] is required.   PA submitted on 8.9.23 Key [id number] Status is pending    Luciano Cutter, CPhT Patient Advocate Phone: [phone]

## 2021-09-08 ENCOUNTER — Telehealth: Payer: Self-pay | Admitting: Nurse Practitioner

## 2021-09-08 NOTE — Telephone Encounter (Signed)
Please see MyChart encounter. Patient aware of results.

## 2021-09-08 NOTE — Telephone Encounter (Signed)
Follow Up:    Patient is returning a call from this morning. Not sure who called her.

## 2021-09-29 ENCOUNTER — Ambulatory Visit (INDEPENDENT_AMBULATORY_CARE_PROVIDER_SITE_OTHER): Payer: Self-pay | Admitting: Nurse Practitioner

## 2021-09-29 ENCOUNTER — Encounter: Payer: Self-pay | Admitting: Nurse Practitioner

## 2021-09-29 VITALS — BP 94/76 | HR 90 | Ht 67.25 in | Wt 125.0 lb

## 2021-09-29 DIAGNOSIS — R1084 Generalized abdominal pain: Secondary | ICD-10-CM

## 2021-09-29 NOTE — Progress Notes (Signed)
Chief Complaint:  abdominal pain    Assessment &  Plan   #26 year old female with chronic, postprandial upper abdominal pain / cramping. EGD in Nov 2022 with findings of non H.pylori gastritis.  Currently not on an acid blocker, just taking Dicyclomine as needed. List of foods that lead to cramping seems to be getting longer for her. Pain doesn't sound biliary in nature.  RUQ in July 2021 was normal despite CT scan suggesting mild gallbladder wall thickening .  Difficult to assess response to treatment since she is taking Bentyl only a couple of times a week. I asked her to increase Bentyl to ac and HS for the next few weeks. Since cramping mainly post-prandial she can try to eliminate bedtime dose of Bentyl.   She prefers to take prilosec 20 mg instead of Pepcid. She will take it Q am.  Asked her to call with a condition update in a few weeks.   # History of C-diff colitis. Ulcerated mucosa in the entire examined colon on colonoscopy Sept 2021. No rectal sparing. Ileal biopsies normal. Colon biopsies showed a mildly active nonspecific colitis with differential diagnosis mainly including infection and drug-effect  HPI   Evelyn Goodwin is a very pleasant 26 y.o. female known to Dr.  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  See PMH /PSH for additional history   02/23/21 last office visit.  follow up on upper abdominal pain and diarrhea. She was no longer using famotidine, pantoprazole, or Carafate but plan was to keep her a script for famotidine active in the event that symptoms recur. Continued on dicyclomine 20 mg QID. If abdominal pain and diarrhea recurred she would be given a trial of Xifaxan   Patient called the office 08/26/2021 Having intermittent generalized abdominal pain and cramping, Asking to resume dicyclomine  and also wanted a course of Xifaxan.  Prescriptions were called to pharmacy, she was given this follow-up appointment.   Interval History:  Evelyn Goodwin picked up the prescription for dicyclomine which she does take as needed.  She is only taking 1-2 doses a week however.  She did not pick up Xifaxan, did not even realize the prescription was being sent.  I do see that a prior authorization was obtained but again she never got the medication.  Certain foods still cause an 'upset' stomach defined as cramps.  She knows many of the culprit foods but continues to discover other ones.  Bentyl does usually help the cramping.  She is not having diarrhea.  Her stools are solid.  She is having at least 3-4 bowel movements a week.  She has generalized abdominal pain.  Sometimes the pain is across the upper abdomen but moves to various locations.  She has phantom pain in the right lower quadrant (post appendectomy).  She has been able to gain some weight.   Previous GI Evaluation   CTAP w contrast  July 2021 IMPRESSION: 1. Nonspecific fluid-filled loops of small bowel with decreasing colonic mural thickening with only mild residual thickening at the level of the rectosigmoid without extraluminal gas or fluid. Findings are suggestive of a resolving colitis. 2. Moderate colonic stool burden seen predominantly right-sided. Correlate for slowed transit. No high-grade obstruction. 3. Prominent parametrial vessels, similar to prior, which can be seen with pelvic congestion syndrome in the appropriate clinical setting. 4. Mild gallbladder wall thickening  without pericholecystic fluid or inflammation. No visible calcified gallstones. If there is clinical concern for acute cholecystitis, consider further evaluation with right upper quadrant ultrasound.  RUQ [place] July 2021 IMPRESSION: 1. No cholelithiasis or findings to suggest acute cholecystitis at this time. Gallbladder wall thickness is normal.  Colonoscopy  10/10/19 - Ulcerated mucosa in the entire examined colon. No rectal sparing. Biopsied. - The examined portion of the ileum was normal. Biopsied. - Ileal biopsies were normal. Colon biopsies showed a mildly active nonspecific colitis. The biopsies show mild edema, lymphoplasmacytosis and focal cryptitis. Features of chronicity were not seen. The pathologist noted that the differential diagnosis mainly includes infection and drug-effect.  EGD 11/19/20  showed gastritis. No H pylori. Esophageal and duodenal biopsies were normal.    Past Medical History:  Diagnosis Date   Anxiety    Clostridium difficile infection    Depression    Psoriasis    Scoliosis     Past Surgical History:  Procedure Laterality Date   LAPAROSCOPIC APPENDECTOMY N/A 05/07/2019   Procedure: APPENDECTOMY LAPAROSCOPIC;  Surgeon: Georganna Skeans, MD;  Location: [facility];  Service: General;  Laterality: N/A;   MOUTH SURGERY     26 year old molers removed   WISDOM TOOTH EXTRACTION  2014    Current Medications, Allergies, Family History and Social History were reviewed in [facility] record.     Current Outpatient Medications  Medication Sig Dispense Refill   ALPRAZolam (XANAX) 0.25 MG tablet Take 0.25 mg by mouth 3 (three) times daily as needed.     buPROPion (WELLBUTRIN XL) 300 MG 24 hr tablet Take 1 tablet by mouth daily.     dicyclomine (BENTYL) 20 MG tablet Take 1 tablet (20 mg total) by mouth in the morning, at noon, in the evening, and at bedtime. 360 tablet 3   diltiazem (CARDIZEM CD) 120 MG 24 hr capsule Take 1 capsule (120 mg total) by mouth daily. 90 capsule 3   diphenhydrAMINE HCl, Sleep, (ZZZQUIL PO) Take by mouth. Take at night     famotidine (PEPCID) 20 MG tablet Take 1 tablet (20 mg total) by mouth 2 (two) times daily. 180 tablet 3   Risankizumab-rzaa (SKYRIZI PEN) 150 MG/ML SOAJ Inject 150 mg into the skin as directed. Every 12 weeks for maintenance. 1 mL 3   No current  facility-administered medications for this visit.    Review of Systems: No chest pain. No shortness of breath. No urinary complaints.    Physical Exam  Wt Readings from Last 3 Encounters:  08/26/21 120 lb 9.6 oz (54.7 kg)  07/07/21 118 lb (53.5 kg)  02/23/21 117 lb (53.1 kg)    BP 94/76   Pulse 90   Ht 5' 7.25" (1.708 m)   Wt 125 lb (56.7 kg)   BMI 19.43 kg/m  Constitutional:  Generally well appearing female in no acute distress. Psychiatric: Pleasant. Normal mood and affect. Behavior is normal. EENT: Pupils normal.  Conjunctivae are normal. No scleral icterus. Neck supple.  Cardiovascular: Normal rate, regular rhythm. No edema Pulmonary/chest: Effort normal and breath sounds normal. No wheezing, rales or rhonchi. Abdominal: Soft, nondistended, nontender. Bowel sounds active throughout. There are no masses palpable. No hepatomegaly. Neurological: Alert and oriented to person place and time. Skin: Skin is warm and dry. No rashes noted.  Tye Savoy, NP  09/29/2021, 8:06 AM

## 2021-09-29 NOTE — Patient Instructions (Signed)
_______________________________________________________  If you are age 26 or older, your body mass index should be between 23-30. Your Body mass index is 19.43 kg/m. If this is out of the aforementioned range listed, please consider follow up with your Primary Care Provider.  If you are age 51 or younger, your body mass index should be between 19-25. Your Body mass index is 19.43 kg/m. If this is out of the aformentioned range listed, please consider follow up with your Primary Care Provider.   ________________________________________________________  The [facility] providers would like to encourage you to use [facility] to communicate with providers for non-urgent requests or questions.  Due to long hold times on the telephone, sending your provider a message by [facility] may be a faster and more efficient way to get a response.  Please allow 48 business hours for a response.  Please remember that this is for non-urgent requests.  _______________________________________________________  Bentyl 4 times daily. Take 30 minutes before meals and at bedtime Prilosec take 30 minutes before breakfast. Stop pepcid  Please call with any questions or concerns.  It was a pleasure to see you today!  Thank you for trusting me with your gastrointestinal care!

## 2021-10-04 NOTE — Progress Notes (Signed)
Reviewed and agree with management plans. ? ?Mychaela Lennartz L. Willam Munford, MD, MPH  ?

## 2021-10-08 ENCOUNTER — Other Ambulatory Visit (HOSPITAL_COMMUNITY): Payer: Self-pay

## 2021-10-09 ENCOUNTER — Other Ambulatory Visit: Payer: Self-pay | Admitting: Dermatology

## 2021-10-09 ENCOUNTER — Other Ambulatory Visit (HOSPITAL_COMMUNITY): Payer: Self-pay

## 2021-10-09 DIAGNOSIS — L4050 Arthropathic psoriasis, unspecified: Secondary | ICD-10-CM

## 2021-10-09 DIAGNOSIS — L409 Psoriasis, unspecified: Secondary | ICD-10-CM

## 2021-10-16 ENCOUNTER — Other Ambulatory Visit (HOSPITAL_COMMUNITY): Payer: Self-pay

## 2021-10-19 ENCOUNTER — Other Ambulatory Visit (HOSPITAL_COMMUNITY): Payer: Self-pay

## 2021-10-19 MED ORDER — DILTIAZEM HCL ER COATED BEADS 240 MG PO CP24: 240.0000 mg | ORAL_CAPSULE | Freq: Every day | ORAL | 1 refills | Status: DC

## 2021-10-22 ENCOUNTER — Telehealth: Payer: Self-pay | Admitting: Nurse Practitioner

## 2021-10-22 NOTE — Telephone Encounter (Signed)
Patient states she is returning a phone call, did not see note in Epic. She said it would be best to contact her via MyChart.

## 2021-10-22 NOTE — Telephone Encounter (Signed)
Lmom, waiting on a return call.  

## 2021-10-23 NOTE — Telephone Encounter (Signed)
Patient is following up, requesting a call back for clarification--would it be ideal to schedule the repeat echo prior to her follow up, 11/10 with NP?  Please advise.

## 2021-10-23 NOTE — Telephone Encounter (Signed)
Message sent via mychart per pts request.

## 2021-10-26 ENCOUNTER — Other Ambulatory Visit (HOSPITAL_COMMUNITY): Payer: Self-pay

## 2021-10-27 ENCOUNTER — Other Ambulatory Visit (HOSPITAL_COMMUNITY): Payer: Self-pay

## 2021-10-28 ENCOUNTER — Other Ambulatory Visit (HOSPITAL_COMMUNITY): Payer: Self-pay

## 2021-11-04 ENCOUNTER — Other Ambulatory Visit (HOSPITAL_COMMUNITY): Payer: Self-pay

## 2021-11-04 MED ORDER — SKYRIZI PEN 150 MG/ML ~~LOC~~ SOAJ: SUBCUTANEOUS | 1 refills | Status: DC

## 2021-11-06 ENCOUNTER — Other Ambulatory Visit (HOSPITAL_COMMUNITY): Payer: Self-pay

## 2021-11-17 ENCOUNTER — Telehealth: Payer: Self-pay | Admitting: Nurse Practitioner

## 2021-11-17 NOTE — Telephone Encounter (Signed)
Pt c/o medication issue:  1. Name of Medication:  diltiazem (CARDIZEM CD) 240 MG 24 hr capsule  2. How are you currently taking this medication (dosage and times per day)? As prescribed   3. Are you having a reaction (difficulty breathing--STAT)? Yes  4. What is your medication issue? Patient is calling stating since the increase in this medication she has noticed that she wakes up in the morning shaky. She states she takes the diltiazem with her Wellbutrin and was unsure if this could be a reaction between the two medications. Please advise.

## 2021-11-17 NOTE — Telephone Encounter (Signed)
Spoke to patient she stated she started taking Diltiazem 240 mg every night 1 week ago.Stated this week she has noticed when she wakes up in mornings she is quivering.She continues to have palpitations during day.She cannot take Diltiazem in am makes her sleepy.Advised I will send message to Diona Browner NP for advice.

## 2021-11-27 ENCOUNTER — Ambulatory Visit: Payer: Self-pay | Admitting: Nurse Practitioner

## 2021-11-27 ENCOUNTER — Encounter: Payer: Self-pay | Admitting: General Practice

## 2021-11-27 ENCOUNTER — Ambulatory Visit: Payer: 59 | Attending: General Practice | Admitting: General Practice

## 2021-11-27 VITALS — BP 117/83 | HR 82 | Ht 67.25 in | Wt 124.2 lb

## 2021-11-27 DIAGNOSIS — Z79899 Other long term (current) drug therapy: Secondary | ICD-10-CM

## 2021-11-27 DIAGNOSIS — I4729 Other ventricular tachycardia: Secondary | ICD-10-CM | POA: Diagnosis not present

## 2021-11-27 DIAGNOSIS — R002 Palpitations: Secondary | ICD-10-CM

## 2021-11-27 DIAGNOSIS — F32A Depression, unspecified: Secondary | ICD-10-CM

## 2021-11-27 DIAGNOSIS — F419 Anxiety disorder, unspecified: Secondary | ICD-10-CM

## 2021-11-27 MED ORDER — PROPRANOLOL HCL 10 MG PO TABS: 10.0000 mg | ORAL_TABLET | Freq: Two times a day (BID) | ORAL | Status: DC | PRN

## 2021-11-27 NOTE — Progress Notes (Signed)
Cardiology Clinic Note   Patient Name: Evelyn Goodwin Date of Encounter: 11/27/2021  Primary Care Provider:  Heywood Bene, PA-C Primary Cardiologist:  Kirk Ruths, MD  Patient Profile    Evelyn Goodwin 26 year old female presents to the clinic today for follow-up evaluation of her palpitations.  Past Medical History    Past Medical History:  Diagnosis Date   Anxiety    Clostridium difficile infection    Depression    Psoriasis    Scoliosis    Past Surgical History:  Procedure Laterality Date   LAPAROSCOPIC APPENDECTOMY N/A 05/07/2019   Procedure: APPENDECTOMY LAPAROSCOPIC;  Surgeon: Georganna Skeans, MD;  Location: [facility];  Service: General;  Laterality: N/A;   MOUTH SURGERY     26 year old molers removed   WISDOM TOOTH EXTRACTION  2014    Allergies  Allergies  Allergen Reactions   Penicillin G Rash   Vancomycin Rash    History of Present Illness    Evelyn Goodwin has a PMH of depression, palpitations, anxiety, and NSVT.  She was seen and evaluated by Dr. Stanford Breed in 2021.  She complained of palpitations.  She wore a cardiac event monitor 3/21 which showed PVCs, and 3 beats of NSVT.  Her echocardiogram 2/21 showed an EF of 60 to 65%, no regional wall motion abnormalities and no valvular abnormalities.  She was seen in the clinic by Diona Browner NP-C on 08/26/2021.  During that time she reported increased palpitations.  She indicated that it felt like it felt like the bottom portion of shaking.  She had never started taking metoprolol.  Her cardiac event monitor was repeated and showed occasional PACs, PVCs, and 3 beats of NSVT.  She indicated that she would mainly notice her palpitations at night.  She felt that the metoprolol was not effective in controlling her palpitations and she asked for alternative medication.  She also indicated that she had not started taking her Wellbutrin because she had been told by her PCP that it could affect her  blood pressure and heart rate with taking metoprolol.  She was started on diltiazem.  She contacted nurse triage line on 08/17/2021.  She indicated that she had been taking diltiazem every night for 1 week.  She would wake up with a quivering sensation.  She was unable to take diltiazem in the morning because it makes her sleepy.  She was instructed to discontinue the medication and start propranolol 10 mg 4 times daily as needed.  She presents to the clinic today for follow-up evaluation states she continues to notice brief intermittent episodes of palpitations.  She notices palpitations at random times.  She notes that with taking diltiazem she would notice a shaking type sensation in the morning teeth chattering.  She discontinued the medication symptoms stopped.  We reviewed her previous echocardiogram cardiac event monitors.  She expressed understanding.  She is currently working at [employer] doing 8-hour shifts.  She indicates that she has not been hydrating well and does not have a formal exercise routine.  We reviewed option for propranolol and importance of increasing her physical activity as well as maintaining p.o. hydration.  We reviewed her recent labs from September.  They are all stable.  We will plan follow-up in 6 months.  Today she denies chest pain, shortness of breath, lower extremity edema, fatigue,  melena, hematuria, hemoptysis, diaphoresis, weakness, presyncope, syncope, orthopnea, and PND.    Home Medications    Prior to Admission  medications   Medication Sig Start Date End Date Taking? Authorizing Provider  ALPRAZolam (XANAX) 0.25 MG tablet Take 0.25 mg by mouth 3 (three) times daily as needed. 03/24/21   [provider]  buPROPion (WELLBUTRIN XL) 300 MG 24 hr tablet Take 1 tablet by mouth daily. 10/06/20   [provider]  dicyclomine (BENTYL) 20 MG tablet Take 1 tablet (20 mg total) by mouth in the morning, at noon, in the evening, and at bedtime.  08/26/21   Thornton Park, MD  diltiazem (CARDIZEM CD) 240 MG 24 hr capsule Take 1 capsule (240 mg total) by mouth daily. 10/19/21   Lenna Sciara, NP  diphenhydrAMINE HCl, Sleep, (ZZZQUIL PO) Take by mouth. Take at night    [provider]  Risankizumab-rzaa (SKYRIZI PEN) 150 MG/ML SOAJ Inject 150 mg into the skin as directed. Every 12 weeks for maintenance. 04/29/21   Tresa Garter, MD    Family History    Family History  Problem Relation Age of Onset   Psoriasis Mother    Healthy Brother    Colon cancer Neg Hx    Esophageal cancer Neg Hx    Pancreatic cancer Neg Hx    Stomach cancer Neg Hx    Rectal cancer Neg Hx    She indicated that her mother is alive. She indicated that her father is alive. She indicated that her brother is alive. She indicated that the status of her neg hx is unknown.  Social History    Social History   Socioeconomic History   Marital status: Single    Spouse name: Not on file   Number of children: 0   Years of education: Not on file   Highest education level: Not on file  Occupational History   Occupation: Museum/gallery curator    Comment: works at [employer] Use   Smoking status: Never   Smokeless tobacco: Never  Scientific laboratory technician Use: Never used  Substance and Sexual Activity   Alcohol use: Yes    Comment: Rare   Drug use: Not Currently   Sexual activity: Yes  Other Topics Concern   Not on file  Social History Narrative   Not on file   Social Determinants of Health   Financial Resource Strain: Not on file  Food Insecurity: Not on file  Transportation Needs: Not on file  Physical Activity: Not on file  Stress: Not on file  Social Connections: Not on file  Intimate Partner Violence: Not on file     Review of Systems    General:  No chills, fever, night sweats or weight changes.  Cardiovascular:  No chest pain, dyspnea on exertion, edema, orthopnea, palpitations, paroxysmal nocturnal  dyspnea. Dermatological: No rash, lesions/masses Respiratory: No cough, dyspnea Urologic: No hematuria, dysuria Abdominal:   No nausea, vomiting, diarrhea, bright red blood per rectum, melena, or hematemesis Neurologic:  No visual changes, wkns, changes in mental status. All other systems reviewed and are otherwise negative except as noted above.  Physical Exam    VS:  BP 117/83 (BP Location: Left Arm, Patient Position: Sitting, Cuff Size: Normal)   Pulse 82   Ht 5' 7.25" (1.708 m)   Wt 124 lb 3.2 oz (56.3 kg)   SpO2 98%   BMI 19.31 kg/m  , BMI Body mass index is 19.31 kg/m. GEN: Well nourished, well developed, in no acute distress. HEENT: normal. Neck: Supple, no JVD, carotid bruits, or masses. Cardiac: RRR, no murmurs,  rubs, or gallops. No clubbing, cyanosis, edema.  Radials/DP/PT 2+ and equal bilaterally.  Respiratory:  Respirations regular and unlabored, clear to auscultation bilaterally. GI: Soft, nontender, nondistended, BS + x 4. MS: no deformity or atrophy. Skin: warm and dry, no rash. Neuro:  Strength and sensation are intact. Psych: Normal affect.  Accessory Clinical Findings    Recent Labs: 07/07/2021: BUN 14; Creatinine, Ser 0.60; Magnesium 1.9; Potassium 4.4; Sodium 137; TSH 1.910   Recent Lipid Panel No results found for: "CHOL", "TRIG", "HDL", "CHOLHDL", "VLDL", "LDLCALC", "LDLDIRECT"       ECG personally reviewed by me today-none today.  Echocardiogram 02/19/2019 IMPRESSIONS     1. The average left ventricular global longitudinal strain is -23.6 %.   2. Left ventricular ejection fraction, by visual estimation, is 60 to  65%. The left ventricle has normal function. There is no left ventricular  hypertrophy.   3. The left ventricle has no regional wall motion abnormalities.   4. Global right ventricle has normal systolic function.The right  ventricular size is normal. No increase in right ventricular wall  thickness.   5. Left atrial size was normal.    6. Right atrial size was normal.   7. The mitral valve is normal in structure. No evidence of mitral valve  regurgitation. No evidence of mitral stenosis.   8. The tricuspid valve is normal in structure.   9. The tricuspid valve is normal in structure. Tricuspid valve  regurgitation is trivial.  10. The aortic valve is normal in structure. Aortic valve regurgitation is  not visualized. No evidence of aortic valve sclerosis or stenosis.  11. The pulmonic valve was normal in structure. Pulmonic valve  regurgitation is not visualized.  12. Normal pulmonary artery systolic pressure.  13. The inferior vena cava is normal in size with greater than 50%  respiratory variability, suggesting right atrial pressure of 3 mmHg.   Cardiac event monitor 08/07/2021  Patch Wear Time:  12 days and 11 hours (2023-06-23T08:43:47-0400 to 2023-07-05T20:09:58-0400)   Patient had a min HR of 44 bpm, max HR of 150 bpm, and avg HR of 76 bpm. Predominant underlying rhythm was Sinus Rhythm. Isolated SVEs were rare (<1.0%), SVE Couplets were rare (<1.0%), and SVE Triplets were rare (<1.0%). Isolated VEs were rare (<1.0%,  784), VE Couplets were rare (<1.0%, 5), and VE Triplets were rare (<1.0%, 1).      Sinus bradycardia, NSR, sinus tachycardia, occasional PAC, PVC and 3 beats NSVT  Kirk Ruths Assessment & Plan   1.  Palpitations, NSVT-heart rate today 82 BPM.  Did not tolerate metoprolol.  Was transitioned to diltiazem.  Noted quivering type sensation on diltiazem.  Was then transition to propranolol.  Cardiac event monitor 08/07/2021 minimum heart rate 44 bpm, maximum heart rate 150, average heart rate 76 bpm, predominantly normal sinus rhythm, isolated SVE's, sinus bradycardia, sinus tachycardia, occasional PACs, PVCs, and 3 beats of NSVT.  Lab work reviewed from PCP. Increase p.o. hydration Increase physical activity as tolerated Avoid caffeine, chocolate, EtOH, dehydration etc. Order propranolol 10  mg-twice daily as needed for sustained palpitations  Anxiety and depression-mood stable.  Previously did not want to take Wellbutrin due to concerns for lowering blood pressure. Mindfulness stress reduction Increase physical activity as tolerated Follows with PCP  Disposition: Follow-up with Dr. Stanford Breed or me in 6 months.   Jossie Ng. Theotis Gerdeman NP-C     11/27/2021, 4:56 PM [address] Office [phone] Fax [phone]  I spent 20minutes examining this patient, reviewing medications, and using patient centered shared decision making involving her cardiac care.  Prior to her visit I spent greater than 20 minutes reviewing her past medical history,  medications, and prior cardiac tests.

## 2021-11-27 NOTE — Patient Instructions (Signed)
Medication Instructions:  Your physician recommends that you continue on your current medications as directed. Please refer to the Current Medication list given to you today. *If you need a refill on your cardiac medications before your next appointment, please call your pharmacy*   Lab Work: None ordered   Testing/Procedures: None ordered   Follow-Up: At [facility], you and your health needs are our priority.  As part of our continuing mission to provide you with exceptional heart care, we have created designated Provider Care Teams.  These Care Teams include your primary Cardiologist (physician) and Advanced Practice Providers (APPs -  Physician Assistants and Nurse Practitioners) who all work together to provide you with the care you need, when you need it.  We recommend signing up for the patient portal called "MyChart".  Sign up information is provided on this After Visit Summary.  MyChart is used to connect with patients for Virtual Visits (Telemedicine).  Patients are able to view lab/test results, encounter notes, upcoming appointments, etc.  Non-urgent messages can be sent to your provider as well.   To learn more about what you can do with MyChart, go to [email or web address].    Your next appointment:   6 month(s)  The format for your next appointment:   In Person  Provider:   Kirk Ruths, MD  or Coletta Memos, NP or Diona Browner, NP    Other Instructions Increase Hydration to 1.5 Liter to 2 liters Walk 20-30 minutes a weekly  Avoid triggers:caffeine, Chocolate, alcohol Avoid dehydration Monitor for period of apnea   Important Information About Sugar

## 2021-11-30 NOTE — Telephone Encounter (Signed)
Call was addressed. Pt was seen in office to discuss mediation change recommended per Diona Browner NP.

## 2021-12-07 ENCOUNTER — Encounter: Payer: Self-pay | Admitting: Gastroenterology

## 2022-01-06 ENCOUNTER — Encounter: Payer: Self-pay | Admitting: Gastroenterology

## 2022-01-28 ENCOUNTER — Ambulatory Visit: Payer: 59 | Admitting: Nurse Practitioner

## 2022-02-11 ENCOUNTER — Telehealth: Payer: Self-pay | Admitting: Nurse Practitioner

## 2022-02-11 NOTE — Telephone Encounter (Signed)
Mychart message sent to pt letting pt know that Dr. Tarri Glenn will let her know if she wants the pt to have blood work the day of the appointment.

## 2022-02-11 NOTE — Telephone Encounter (Signed)
Inbound call from patient stating she needed to reschedule her appointment for tomorrow at 11:30 with Nevin Bloodgood due to a scheduling conflict and wanted to reschedule with Dr. Tarri Glenn. Patient was scheduled with Dr. Tarri Glenn on 2/13 at 10:10 and is seeking advice if she will need to do blood work that day. Patient is requesting a Mychart message to be sent with recommendation. Please advise.

## 2022-02-12 ENCOUNTER — Ambulatory Visit: Payer: 59 | Admitting: Nurse Practitioner

## 2022-02-23 ENCOUNTER — Encounter: Payer: Self-pay | Admitting: Gastroenterology

## 2022-03-02 ENCOUNTER — Other Ambulatory Visit: Payer: Self-pay | Admitting: Gastroenterology

## 2022-03-02 ENCOUNTER — Ambulatory Visit (INDEPENDENT_AMBULATORY_CARE_PROVIDER_SITE_OTHER): Payer: 59 | Admitting: Gastroenterology

## 2022-03-02 ENCOUNTER — Other Ambulatory Visit: Payer: 59

## 2022-03-02 ENCOUNTER — Encounter: Payer: Self-pay | Admitting: Gastroenterology

## 2022-03-02 ENCOUNTER — Other Ambulatory Visit: Payer: Self-pay

## 2022-03-02 VITALS — BP 122/74 | HR 97 | Ht 67.0 in | Wt 120.0 lb

## 2022-03-02 DIAGNOSIS — R1031 Right lower quadrant pain: Secondary | ICD-10-CM | POA: Diagnosis not present

## 2022-03-02 MED ORDER — DICYCLOMINE HCL 20 MG PO TABS: 20.0000 mg | ORAL_TABLET | Freq: Four times a day (QID) | ORAL | 3 refills | Status: DC

## 2022-03-02 MED ORDER — RIFAXIMIN 550 MG PO TABS: 550.0000 mg | ORAL_TABLET | Freq: Three times a day (TID) | ORAL | 0 refills | Status: DC

## 2022-03-02 NOTE — Patient Instructions (Addendum)
Please increase dicyclomine 20 four times daily.   We discussed a trial of Xifaxan 550 mg three times daily for 14 days.  Continue to use the dicyclomine - but please see if using it four times daily provides some relief.   CT abd/pelvis recommended. You have been scheduled for a CT scan of the abdomen and pelvis at [facility], 1st floor Radiology. You are scheduled on Monday 03/15/22 at 2:30 pm. You should arrive 15 minutes prior to your appointment time for registration.    Please follow the written instructions below on the day of your exam:   1) Do not eat anything after 12 pm (4 hours prior to your test)   You may take any medications as prescribed with a small amount of water, if necessary. If you take any of the following medications: METFORMIN, GLUCOPHAGE, GLUCOVANCE, AVANDAMET, RIOMET, FORTAMET, [place] MET, JANUMET, GLUMETZA or METAGLIP, you MAY be asked to HOLD this medication 48 hours AFTER the exam.   The purpose of you drinking the oral contrast is to aid in the visualization of your intestinal tract. The contrast solution may cause some diarrhea. Depending on your individual set of symptoms, you may also receive an intravenous injection of x-ray contrast/dye. Plan on being at [facility] for 45 minutes or longer, depending on the type of exam you are having performed.   If you have any questions regarding your exam or if you need to reschedule, you may call [facility] at [phone] between the hours of 8:00 am and 5:00 pm, Monday-Friday.   Colonoscopy if CT is non-diagnostic.   We discussed a referral to an allergist. A referral, your demographics, a copy of your insurance card and your records will be sent to [facility]. You will receive a call from their office regarding the date, time and location of your appointment.

## 2022-03-02 NOTE — Telephone Encounter (Signed)
Please submit for PA

## 2022-03-02 NOTE — Progress Notes (Signed)
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 History of diarrhea with acute colitis on colon biopsies, now resolved    - no chronic features or diagnostic findings of IBD on colon biopsies Appendectomy for severe appendicitis 05/07/19  Psoriatic arthritis on Skyrizi Patient concern about food allergies  PLAN: - Continue dicyclomine 20 mg QID (refilled today with 3 month supply with 3 refills) - CRP to screen for IBD - Consider trial Xifaxan 550 mg TID x 14 days for IBS - Referral to Allergy for further evaluation of food allergies (patient request) - CT abd/pelvis with contrast given localized abdominal pain in the location of prior appendix and to evaluate for IBD given history in 2021 with acute colitis - Colonoscopy if CT is negative    HPI: Evelyn Goodwin is a 27 y.o. female who returns in scheduled follow-up.  She was last seen by Tye Savoy 05/28/20. She has acne, guttate psoriasis, and psoriatic arthritis. She had GI symptoms on Cosentyx and more recently [employer].  She previously failed Cimzia and methotrexate along with multiple topical therapies.   She had ruptured appendicitis April  2021. She was initially treated with antibiotics, but abscess got larger and she subsequently underwent surgery on April 19.  She had significant postoperative abdominal pain and diarrhea. Diagnosed with C. Difficile.  CT scan at the time showed pancolonic wall thickening and inflammation to the  rectum.  She was treated with vancomycin 125 mg 4 times daily for 2 weeks.  Her symptoms only temporarily improved. C. difficile was positive again on June 25 and had ongoing diffuse abdominal pain and diarrhea, including nocturnal symptoms.    Colonoscopy 10/10/19 - Ulcerated mucosa in the entire examined colon. No rectal sparing. Biopsied. - The examined portion of the ileum was normal. Biopsied. - Ileal biopsies were normal. Colon biopsies showed a mildly active nonspecific colitis. The biopsies show mild edema, lymphoplasmacytosis and focal cryptitis. Features of chronicity were not seen. The pathologist noted that the differential diagnosis mainly includes infection and drug-effect.  At the time of her office visit 05/28/20 she was feeling better with trials of Bentyl and empiric doxycycline. Bowel habits had returned to normal with 1-2 formed bowel movements daily and her abdominal pain had largely resolved.   Seen 11/10/20 with a new complaint of post-prandial epigastric/upper abdominal pain and cramping. Associated nausea. No identified triggers. No further diarrhea.  No dysphagia, odynophagia, neck pain, sore throat, dysphonia, cough, blood in the stool or change in bowel habits. No change in symptoms with PPI. In speaking with her family, she is concerned that she needs to have an upper endoscopy.   EGD 11/19/20 showed gastritis. No H pylori. Esophageal and duodenal biopsies were normal. She was treated with pantoprazole 20 mg BID in addition of famotidine 20 mg Bid and Carafate.   Last seen by Tye Savoy, NP 09/29/21 for chronic, postprandial upper abdominal pain and cramping.  The list of foods triggering her symptoms was getting longer.  Bentyl was increased to 4 times daily and  she was continued on Prilosec to try to minimize her symptoms.  Returns today with ongoing cramping in the location of her appendix with associated nausea and constipation. The pain is more severe than when she  was here in September.  She is specifically requesting allergy testing. She remains concerned about unintentional weight loss, but, she is unable to quantify the total weight loss. 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 She is currently working at [employer].   Labs 02/12/22 show normal TSH, CMP, CBC  Prior abdominal imaging includes: Normal -Abdominal ultrasound 07/23/2019 -Multiple CTs of the abdomen and pelvis with contrast most recently 07/23/2019 showing nonspecific fluid-filled loops of small bowel, mild residual thickening at the rectosigmoid junction suggestive of resolving colitis, moderate stool burden, prominent parametrial vessels, and mild gallbladder wall thickening.  Past Medical History:  Diagnosis Date   Anxiety    Clostridium difficile infection    Depression    Psoriasis    Scoliosis     Past Surgical History:  Procedure Laterality Date   LAPAROSCOPIC APPENDECTOMY N/A 05/07/2019   Procedure: APPENDECTOMY LAPAROSCOPIC;  Surgeon: Georganna Skeans, MD;  Location: [place];  Service: General;  Laterality: N/A;   MOUTH SURGERY     27 year old molers removed   [place] EXTRACTION  2014    Current Outpatient Medications  Medication Sig Dispense Refill   ALPRAZolam (XANAX) 0.25 MG tablet Take 0.25 mg by mouth 3 (three) times daily as needed.     buPROPion (WELLBUTRIN XL) 300 MG 24 hr tablet Take 1 tablet by mouth daily.     rifaximin (XIFAXAN) 550 MG TABS tablet Take 1 tablet (550 mg total) by mouth 3 (three) times daily. 42 tablet 0   Risankizumab-rzaa (SKYRIZI PEN) 150 MG/ML SOAJ Inject 150 mg into the skin as directed. Every 12 weeks for maintenance. 1 mL 3   dicyclomine (BENTYL) 20 MG tablet Take 1 tablet (20 mg total) by mouth in the morning, at noon, in the evening, and at bedtime. 360 tablet 3   diltiazem (CARDIZEM CD) 240 MG 24 hr capsule Take 1 capsule (240 mg total) by mouth daily. (Patient not taking: Reported on 03/02/2022) 90 capsule 1   diphenhydrAMINE HCl,  Sleep, (ZZZQUIL PO) Take by mouth. Take at night (Patient not taking: Reported on 03/02/2022)     Current Facility-Administered Medications  Medication Dose Route Frequency Provider Last Rate Last Admin   propranolol (INDERAL) tablet 10 mg  10 mg Oral BID PRN Deberah Pelton, NP        Allergies as of 03/02/2022 - Review Complete 03/02/2022  Allergen Reaction Noted   Cardizem [diltiazem] Palpitations 03/02/2022   Penicillin g Rash 05/10/2016   Vancomycin Rash 07/24/2019   Physical Exam: General:   Alert,  well-nourished, pleasant and cooperative in NAD Head:  Normocephalic and atraumatic. Eyes:  Sclera clear, no icterus.   Conjunctiva pink. Abdomen:  Soft, nondistended, normal bowel sounds, no rebound or guarding. No hepatosplenomegaly.   Neurologic:  Alert and  oriented x4;  grossly nonfocal Skin:  Intact without significant lesions or rashes. Psych:  Alert and cooperative. Normal mood and affect.     Tadeo Besecker L. Tarri Glenn, MD, MPH 03/09/2022, 12:12 PM

## 2022-03-04 ENCOUNTER — Encounter: Payer: Self-pay | Admitting: Gastroenterology

## 2022-03-05 ENCOUNTER — Other Ambulatory Visit: Payer: Self-pay

## 2022-03-05 DIAGNOSIS — R1031 Right lower quadrant pain: Secondary | ICD-10-CM

## 2022-03-09 ENCOUNTER — Encounter: Payer: Self-pay | Admitting: Gastroenterology

## 2022-03-09 ENCOUNTER — Other Ambulatory Visit (INDEPENDENT_AMBULATORY_CARE_PROVIDER_SITE_OTHER): Payer: 59

## 2022-03-09 DIAGNOSIS — R1031 Right lower quadrant pain: Secondary | ICD-10-CM | POA: Diagnosis not present

## 2022-03-09 LAB — C-REACTIVE PROTEIN: CRP: <1 mg/dL (ref 0.5–20.0)

## 2022-03-15 ENCOUNTER — Ambulatory Visit (HOSPITAL_COMMUNITY): Payer: 59

## 2022-03-17 ENCOUNTER — Ambulatory Visit (HOSPITAL_BASED_OUTPATIENT_CLINIC_OR_DEPARTMENT_OTHER)
Admission: RE | Admit: 2022-03-17 | Discharge: 2022-03-17 | Disposition: A | Discharge status: Home / Self Care | Payer: 59 | Source: Ambulatory Visit | Attending: Gastroenterology | Admitting: Gastroenterology

## 2022-03-17 DIAGNOSIS — R1031 Right lower quadrant pain: Secondary | ICD-10-CM | POA: Diagnosis present

## 2022-03-17 MED ORDER — IOHEXOL 300 MG/ML  SOLN
100.0000 mL | Freq: Once | INTRAMUSCULAR | Status: AC | PRN
  Administered 2022-03-17: 80 mL via INTRAVENOUS

## 2022-03-18 ENCOUNTER — Telehealth: Payer: Self-pay | Admitting: Gastroenterology

## 2022-03-18 NOTE — Telephone Encounter (Signed)
Inbound call from pt, she will like to speak with a nurse regarding Ct results .Please advise

## 2022-03-18 NOTE — Telephone Encounter (Signed)
The pt has been advised that her CT has not been reviewed as of this afternoon. She will be called as soon as able.

## 2022-03-21 ENCOUNTER — Encounter: Payer: Self-pay | Admitting: Gastroenterology

## 2022-03-22 ENCOUNTER — Encounter: Payer: Self-pay | Admitting: Gastroenterology

## 2022-03-22 NOTE — Telephone Encounter (Signed)
Spoke with pt by phone regarding results.

## 2022-03-26 ENCOUNTER — Other Ambulatory Visit (HOSPITAL_COMMUNITY): Payer: Self-pay

## 2022-03-26 NOTE — Telephone Encounter (Signed)
Not able to complete PA due to patients insurance showing this denial:

## 2022-04-15 ENCOUNTER — Telehealth: Payer: Self-pay | Admitting: Gastroenterology

## 2022-04-15 NOTE — Telephone Encounter (Signed)
Inbound call from patient. States she has been experiencing abdominal pain since yesterday evening. Patient would like to speak with a nurse. Please advise.

## 2022-04-19 NOTE — Telephone Encounter (Signed)
Inbound call from patient, states she would like to schedule colonoscopy and would also like to speak with a nurse about note below.

## 2022-04-20 NOTE — Telephone Encounter (Signed)
The pt has decided to proceed with colon as recommended by Dr Tarri Glenn.  She has been scheduled with Dr Silverio Decamp with previsit.  She will call back if she can not proceed with this appt date.

## 2022-04-21 ENCOUNTER — Encounter: Payer: Self-pay | Admitting: Gastroenterology

## 2022-04-23 ENCOUNTER — Other Ambulatory Visit (HOSPITAL_COMMUNITY): Payer: Self-pay

## 2022-04-23 DIAGNOSIS — R110 Nausea: Secondary | ICD-10-CM | POA: Diagnosis not present

## 2022-04-23 DIAGNOSIS — Z79899 Other long term (current) drug therapy: Secondary | ICD-10-CM | POA: Diagnosis not present

## 2022-04-23 DIAGNOSIS — F419 Anxiety disorder, unspecified: Secondary | ICD-10-CM | POA: Diagnosis not present

## 2022-04-23 DIAGNOSIS — Z8679 Personal history of other diseases of the circulatory system: Secondary | ICD-10-CM | POA: Diagnosis not present

## 2022-04-23 DIAGNOSIS — L400 Psoriasis vulgaris: Secondary | ICD-10-CM | POA: Diagnosis not present

## 2022-04-23 DIAGNOSIS — R002 Palpitations: Secondary | ICD-10-CM | POA: Diagnosis not present

## 2022-04-23 DIAGNOSIS — F3340 Major depressive disorder, recurrent, in remission, unspecified: Secondary | ICD-10-CM | POA: Diagnosis not present

## 2022-04-23 MED ORDER — SKYRIZI PEN 150 MG/ML ~~LOC~~ SOAJ: SUBCUTANEOUS | 1 refills | Status: DC

## 2022-04-23 MED ORDER — SKYRIZI PEN 150 MG/ML ~~LOC~~ SOAJ: SUBCUTANEOUS | 0 refills | Status: DC

## 2022-04-26 ENCOUNTER — Other Ambulatory Visit (HOSPITAL_COMMUNITY): Payer: Self-pay

## 2022-04-26 ENCOUNTER — Other Ambulatory Visit: Payer: Self-pay

## 2022-04-28 ENCOUNTER — Other Ambulatory Visit (HOSPITAL_COMMUNITY): Payer: Self-pay

## 2022-04-29 ENCOUNTER — Other Ambulatory Visit (HOSPITAL_COMMUNITY): Payer: Self-pay

## 2022-04-30 ENCOUNTER — Other Ambulatory Visit (HOSPITAL_COMMUNITY): Payer: Self-pay

## 2022-05-01 ENCOUNTER — Other Ambulatory Visit (HOSPITAL_COMMUNITY): Payer: Self-pay

## 2022-05-03 ENCOUNTER — Telehealth: Payer: Self-pay | Admitting: Pharmacist

## 2022-05-03 ENCOUNTER — Other Ambulatory Visit (HOSPITAL_COMMUNITY): Payer: Self-pay

## 2022-05-03 ENCOUNTER — Other Ambulatory Visit: Payer: Self-pay

## 2022-05-03 MED ORDER — SKYRIZI PEN 150 MG/ML ~~LOC~~ SOAJ: SUBCUTANEOUS | 1 refills | Status: DC

## 2022-05-03 NOTE — Telephone Encounter (Signed)
Called patient to schedule an appointment for the [employer] [facility] [employer] [facility]. I was unable to reach the patient so I left a HIPAA-compliant message requesting that the patient return my call.   Luke Van Ausdall, PharmD, BCACP, CPP Clinical Pharmacist [facility] [phone]  

## 2022-05-04 ENCOUNTER — Other Ambulatory Visit (HOSPITAL_COMMUNITY): Payer: Self-pay

## 2022-05-05 ENCOUNTER — Other Ambulatory Visit: Payer: Self-pay

## 2022-05-05 ENCOUNTER — Other Ambulatory Visit (HOSPITAL_COMMUNITY): Payer: Self-pay

## 2022-05-05 ENCOUNTER — Other Ambulatory Visit: Payer: Self-pay | Admitting: Pharmacist

## 2022-05-05 ENCOUNTER — Ambulatory Visit: Payer: Self-pay | Attending: Physician Assistant | Admitting: Pharmacist

## 2022-05-05 DIAGNOSIS — Z7189 Other specified counseling: Secondary | ICD-10-CM

## 2022-05-05 MED ORDER — SKYRIZI PEN 150 MG/ML ~~LOC~~ SOAJ
SUBCUTANEOUS | 1 refills | Status: DC
  Filled 2022-05-05 (×2): qty 1, 84d supply, fill #0
  Filled 2022-05-05: qty 1, fill #0
  Filled 2022-05-07 – 2022-05-13 (×4): qty 1, 84d supply, fill #0

## 2022-05-05 NOTE — Progress Notes (Signed)
   S: Patient presents for review of their specialty medication therapy.  Patient is currently taking Skyrizi for psoriasis. Patient is establishing with Dr. Onalee Hua for this.   Adherence: confirms  Efficacy: reports good results. In fact, she can tell when her dose is late as she starts to experience joint pain.   Dosing: Plaque psoriasis, moderate to severe: SubQ: Two consecutive injections (75 mg each) for a total dose of 150 mg at weeks 0, 4, and then every 12 weeks thereafter.  Screening: TB test: completed per patient   Monitoring: S/sx of infection: none  S/sx of hypersensitivity/injection site reaction: none   O:     Lab Results  Component Value Date   WBC 17.6 (H) 07/22/2019   HGB 13.0 07/22/2019   HCT 40.2 07/22/2019   MCV 84.5 07/22/2019   PLT 563 (H) 07/22/2019      Chemistry      Component Value Date/Time   NA 137 07/07/2021 0844   K 4.4 07/07/2021 0844   CL 100 07/07/2021 0844   CO2 26 07/07/2021 0844   BUN 14 07/07/2021 0844   CREATININE 0.60 07/07/2021 0844      Component Value Date/Time   CALCIUM 9.7 07/07/2021 0844   ALKPHOS 68 07/22/2019 1945   AST 18 07/22/2019 1945   ALT 12 07/22/2019 1945   BILITOT 0.3 07/22/2019 1945       A/P: 1. Medication review: Patient currently on Skyrizi for psoriasis. Reviewed the medication with the patient, including the following: Cristy Folks is a monoclonal antibody used in the treatment of psoriasis. Patient educated on purpose, proper use and potential adverse effects of Skyrizi. Possible adverse effects are infections, headache, and injection site reactions. Live vaccinations should be avoided while on therapy. SubQ: Administer the two consecutive injections subcutaneously at different anatomic locations, such as thighs, abdomen, or back of upper arms. Do not inject into areas where the skin is tender, bruised, red, hard, or affected by psoriasis. Intended for use under supervision of a health care professional;  self-injection may occur after proper training (except back of upper arms). No recommendations for any changes at this time.   Butch Penny, PharmD, Patsy [place], CPP Clinical Pharmacist [facility] [phone]

## 2022-05-07 ENCOUNTER — Other Ambulatory Visit (HOSPITAL_COMMUNITY): Payer: Self-pay

## 2022-05-10 ENCOUNTER — Other Ambulatory Visit: Payer: Self-pay

## 2022-05-10 ENCOUNTER — Ambulatory Visit (INDEPENDENT_AMBULATORY_CARE_PROVIDER_SITE_OTHER): Payer: 59 | Admitting: Dermatology

## 2022-05-10 ENCOUNTER — Encounter: Payer: Self-pay | Admitting: Dermatology

## 2022-05-10 ENCOUNTER — Other Ambulatory Visit (HOSPITAL_COMMUNITY): Payer: Self-pay

## 2022-05-10 VITALS — BP 122/79 | HR 100

## 2022-05-10 DIAGNOSIS — L400 Psoriasis vulgaris: Secondary | ICD-10-CM | POA: Diagnosis not present

## 2022-05-10 DIAGNOSIS — Z79899 Other long term (current) drug therapy: Secondary | ICD-10-CM

## 2022-05-10 DIAGNOSIS — L4050 Arthropathic psoriasis, unspecified: Secondary | ICD-10-CM | POA: Diagnosis not present

## 2022-05-10 MED ORDER — SKYRIZI PEN 150 MG/ML ~~LOC~~ SOAJ
150.0000 mg | SUBCUTANEOUS | 1 refills | Status: DC
  Filled 2022-05-10 – 2022-05-13 (×2): qty 1, 84d supply, fill #0
  Filled ????-??-??: fill #0

## 2022-05-10 MED ORDER — RISANKIZUMAB-RZAA 150 MG/ML ~~LOC~~ SOAJ
150.0000 mg | Freq: Once | SUBCUTANEOUS | Status: AC
Start: 2022-05-10 — End: 2022-05-10
  Administered 2022-05-10: 150 mg via SUBCUTANEOUS

## 2022-05-10 NOTE — Progress Notes (Signed)
   New Patient Visit   Subjective  Evelyn Goodwin is a 27 y.o. female who presents for the following: Psoriasis. Scattered body. States is dormant right now. Does not respond to topicals. Is out of Skyrizi, needs refills. 3 weeks overdue for injection. On Skyrizi since 04/2021. Has seen Dr. Jorja Loa in the past. 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 Has taken Cosentyx in the past. States it caused gastric issues.  The following portions of the chart were reviewed this encounter and updated as appropriate: medications, allergies, medical history  Review of Systems:  No other skin or systemic complaints except as noted in HPI or Assessment and Plan.  Objective  Well appearing patient in no apparent distress; mood and affect are within normal limits.  Areas Examined: Head, arms, face  Relevant exam findings are noted in the Assessment and Plan.      Assessment & Plan   Plaque psoriasis  Related Procedures QuantiFERON-TB Gold Plus  Related Medications Risankizumab-rzaa SOAJ 150 mg   Encounter for long-term (current) use of medications  Related Procedures QuantiFERON-TB Gold Plus  Psoriatic arthritis  Psoriasis    PSORIASIS Clear today.  0% BSA. Joint pain in fingers and arms.  Chronic condition with duration or expected duration over one year. Currently well-controlled.   Patient has joint pain in fingers  Treatment Plan: Continue Skyrizi  injection every 3 months as directed.  Reviewed risks of biologics including immunosuppression, infections, injection site reaction, and failure to improve condition. Goal is control of skin condition, not cure.  Some older biologics such as Humira and Enbrel may slightly increase risk of malignancy and may worsen congestive heart failure.  Taltz and Cosentyx may cause inflammatory bowel disease to flare. The use of biologics requires long term medication management, including periodic office visits and monitoring of  blood work.   Counseling on psoriasis and coordination of care  psoriasis is a chronic non-curable, but treatable genetic/hereditary disease that may have other systemic features affecting other organ systems such as joints (Psoriatic Arthritis). It is associated with an increased risk of inflammatory bowel disease, heart disease, non-alcoholic fatty liver disease, and depression.  Treatments include light and laser treatments; topical medications; and systemic medications including oral and injectables.  Sample given today. Patient self injected into right thigh.  NDC: 1610-9604-54 Lot: [id number] Exp: 09/2022  Return in about 6 months (around 11/09/2022) for Psoriasis Follow Up.  I, Lawson Radar, CMA, am acting as scribe for Langston Reusing, MD.   Documentation: I have reviewed the above documentation for accuracy and completeness, and I agree with the above.  Langston Reusing, MD

## 2022-05-10 NOTE — Patient Instructions (Addendum)
Continue Skyrizi  injection every 3 months as directed.  Reviewed risks of biologics including immunosuppression, infections, injection site reaction, and failure to improve condition. Goal is control of skin condition, not cure.  Some older biologics such as Humira and Enbrel may slightly increase risk of malignancy and may worsen congestive heart failure.  Taltz and Cosentyx may cause inflammatory bowel disease to flare. The use of biologics requires long term medication management, including periodic office visits and monitoring of blood work. Avoid live vaccines.    Counseling on psoriasis and coordination of care  psoriasis is a chronic non-curable, but treatable genetic/hereditary disease that may have other systemic features affecting other organ systems such as joints (Psoriatic Arthritis). It is associated with an increased risk of inflammatory bowel disease, heart disease, non-alcoholic fatty liver disease, and depression.  Treatments include light and laser treatments; topical medications; and systemic medications including oral and injectables.  Due to recent changes in healthcare laws, you may see results of your pathology and/or laboratory studies on MyChart before the doctors have had a chance to review them. We understand that in some cases there may be results that are confusing or concerning to you. Please understand that not all results are received at the same time and often the doctors may need to interpret multiple results in order to provide you with the best plan of care or course of treatment. Therefore, we ask that you please give [place] 2 business days to thoroughly review all your results before contacting the office for clarification. Should we see a critical lab result, you will be contacted sooner.   If You Need Anything After Your Visit  If you have any questions or concerns for your doctor, please call our main line at [phone] If no one answers, please leave a  voicemail as directed and we will return your call as soon as possible. Messages left after 4 pm will be answered the following business day.   You may also send [place] a message via MyChart. We typically respond to MyChart messages within 1-2 business days.  For prescription refills, please ask your pharmacy to contact our office. Our fax number is [phone].  If you have an urgent issue when the clinic is closed that cannot wait until the next business day, you can page your doctor at the number below.    Please note that while we do our best to be available for urgent issues outside of office hours, we are not available 24/7.   If you have an urgent issue and are unable to reach [place], you may choose to seek medical care at your doctor's office, retail clinic, urgent care center, or emergency room.  If you have a medical emergency, please immediately call 911 or go to the emergency department. In the event of inclement weather, please call our main line at [phone] for an update on the status of any delays or closures.  Dermatology Medication Tips: Please keep the boxes that topical medications come in in order to help keep track of the instructions about where and how to use these. Pharmacies typically print the medication instructions only on the boxes and not directly on the medication tubes.   If your medication is too expensive, please contact our office at [phone] or send [place] a message through MyChart.   We are unable to tell what your co-pay for medications will be in advance as this is different depending on your insurance coverage. However, we may be able to  find a substitute medication at lower cost or fill out paperwork to get insurance to cover a needed medication.   If a prior authorization is required to get your medication covered by your insurance company, please allow [place] 1-2 business days to complete this process.  Drug prices often vary depending on where the  prescription is filled and some pharmacies may offer cheaper prices.  The website www.goodrx.com contains coupons for medications through different pharmacies. The prices here do not account for what the cost may be with help from insurance (it may be cheaper with your insurance), but the website can give you the price if you did not use any insurance.  - You can print the associated coupon and take it with your prescription to the pharmacy.  - You may also stop by our office during regular business hours and pick up a GoodRx coupon card.  - If you need your prescription sent electronically to a different pharmacy, notify our office through [facility] or by phone at [phone]

## 2022-05-11 ENCOUNTER — Other Ambulatory Visit: Payer: Self-pay

## 2022-05-11 ENCOUNTER — Other Ambulatory Visit (HOSPITAL_COMMUNITY): Payer: Self-pay

## 2022-05-13 ENCOUNTER — Other Ambulatory Visit (HOSPITAL_COMMUNITY): Payer: Self-pay

## 2022-05-20 NOTE — Progress Notes (Signed)
HPI: Follow-up palpitations. Echocardiogram February 2021 showed normal LV function.  Monitor July 2023 showed sinus rhythm with occasional PAC, PVC and 3 beats nonsustained ventricular tachycardia.  Since last seen she continues to have difficulties with palpitations.  They are described as a flutter.  She otherwise denies dyspnea, chest pain or syncope.  Current Outpatient Medications  Medication Sig Dispense Refill   ALPRAZolam (XANAX) 0.25 MG tablet Take 0.25 mg by mouth 3 (three) times daily as needed.     buPROPion (WELLBUTRIN XL) 300 MG 24 hr tablet Take 1 tablet by mouth daily.     cetirizine (ZYRTEC) 10 MG tablet Take by mouth.     citalopram (CELEXA) 10 MG tablet Take 1 tablet by mouth daily.     dicyclomine (BENTYL) 20 MG tablet Take 1 tablet (20 mg total) by mouth in the morning, at noon, in the evening, and at bedtime. 360 tablet 3   fluticasone (FLONASE) 50 MCG/ACT nasal spray Place into the nose.     ondansetron (ZOFRAN-ODT) 4 MG disintegrating tablet Take by mouth.     Risankizumab-rzaa (SKYRIZI PEN) 150 MG/ML SOAJ Inject 150 mg into the skin as directed. Every 12 weeks for maintenance. 1 mL 1   diltiazem (CARDIZEM CD) 240 MG 24 hr capsule Take 1 capsule (240 mg total) by mouth daily. 90 capsule 1   diphenhydrAMINE HCl, Sleep, (ZZZQUIL PO) Take by mouth. Take at night     famotidine (PEPCID) 20 MG tablet famotidine 20 mg tablet TAKE 1 TABLET BY MOUTH TWICE A DAY     rifaximin (XIFAXAN) 550 MG TABS tablet Take 1 tablet (550 mg total) by mouth 3 (three) times daily. 42 tablet 0   Risankizumab-rzaa (SKYRIZI PEN) 150 MG/ML SOAJ 1 (one) Pre-filled Pen Syringe every 3 months 1 mL 1   Current Facility-Administered Medications  Medication Dose Route Frequency Provider Last Rate Last Admin   propranolol (INDERAL) tablet 10 mg  10 mg Oral BID PRN Ronney Asters, NP         Past Medical History:  Diagnosis Date   Anxiety    Clostridium difficile infection    Depression     Psoriasis    Scoliosis     Past Surgical History:  Procedure Laterality Date   LAPAROSCOPIC APPENDECTOMY N/A 05/07/2019   Procedure: APPENDECTOMY LAPAROSCOPIC;  Surgeon: Violeta Gelinas, MD;  [facility];  Service: General;  Laterality: N/A;   MOUTH SURGERY     27 year old molers removed   WISDOM TOOTH EXTRACTION  2014    Social History   Socioeconomic History   Marital status: Single    Spouse name: Not on file   Number of children: 0   Years of education: Not on file   Highest education level: Not on file  Occupational History   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[employer]    Comment: works at [employer]   Tobacco Use   Smoking status: Never   Smokeless tobacco: Never  Vaping Use   Vaping Use: Never used  Substance and Sexual Activity   Alcohol use: Yes    Comment: Rare   Drug use: Not Currently   Sexual activity: Yes  Other Topics Concern   Not on file  Social History Narrative   Not on file   Social Determinants of Health   Financial Resource Strain: Not on file  Food Insecurity: Not on file  Transportation Needs: Not on file  Physical Activity: Not on file  Stress: Not on  file  Social Connections: Not on file  Intimate Partner Violence: Not on file    Family History  Problem Relation Age of Onset   Psoriasis Mother    Healthy Brother    Colon cancer Neg Hx    Esophageal cancer Neg Hx    Pancreatic cancer Neg Hx    Stomach cancer Neg Hx    Rectal cancer Neg Hx     ROS: no fevers or chills, productive cough, hemoptysis, dysphasia, odynophagia, melena, hematochezia, dysuria, hematuria, rash, seizure activity, orthopnea, PND, pedal edema, claudication. Remaining systems are negative.  Physical Exam: Well-developed well-nourished in no acute distress.  Skin is warm and dry.  HEENT is normal.  Neck is supple.  Chest is clear to auscultation with normal expansion.  Cardiovascular exam is regular rate and rhythm.  Abdominal exam nontender or distended. No masses  palpated. Extremities show no edema. neuro grossly intact  ECG-normal sinus rhythm at a rate of 78, normal axis, RV conduction delay.  Personally reviewed  A/P  1 palpitations-  Previous monitor showed PACs, PVCs and nonsustained VT's.  Note LV function is normal.  She continues to have problems with palpitations.  She is not taking Cardizem.  We will retry Toprol 25 mg nightly to see if this improves her symptoms.  This would also help with her fatigue during the day if she takes it in the evening.  Olga Millers, MD

## 2022-05-30 ENCOUNTER — Encounter: Payer: Self-pay | Admitting: Dermatology

## 2022-05-31 ENCOUNTER — Ambulatory Visit: Payer: Commercial Managed Care - HMO | Attending: Cardiology | Admitting: Cardiology

## 2022-05-31 ENCOUNTER — Encounter: Payer: Self-pay | Admitting: Cardiology

## 2022-05-31 ENCOUNTER — Encounter: Payer: 59 | Admitting: Gastroenterology

## 2022-05-31 VITALS — BP 112/68 | HR 100 | Ht 67.0 in | Wt 120.0 lb

## 2022-05-31 DIAGNOSIS — R002 Palpitations: Secondary | ICD-10-CM

## 2022-05-31 DIAGNOSIS — I4729 Other ventricular tachycardia: Secondary | ICD-10-CM | POA: Diagnosis not present

## 2022-05-31 MED ORDER — METOPROLOL SUCCINATE ER 25 MG PO TB24: 25.0000 mg | ORAL_TABLET | Freq: Every day | ORAL | 3 refills | Status: DC

## 2022-05-31 NOTE — Patient Instructions (Signed)
Medication Instructions:  Toprol 25 mg every night *If you need a refill on your cardiac medications before your next appointment, please call your pharmacy*  Follow-Up: At [facility], you and your health needs are our priority.  As part of our continuing mission to provide you with exceptional heart care, we have created designated Provider Care Teams.  These Care Teams include your primary Cardiologist (physician) and Advanced Practice Providers (APPs -  Physician Assistants and Nurse Practitioners) who all work together to provide you with the care you need, when you need it.  We recommend signing up for the patient portal called "MyChart".  Sign up information is provided on this After Visit Summary.  MyChart is used to connect with patients for Virtual Visits (Telemedicine).  Patients are able to view lab/test results, encounter notes, upcoming appointments, etc.  Non-urgent messages can be sent to your provider as well.   To learn more about what you can do with MyChart, go to [email or web address].    Your next appointment:   1 year(s)  Provider:   Olga Millers, MD

## 2022-05-31 NOTE — Telephone Encounter (Signed)
Please call patient and schedule an appointment. Thank you

## 2022-06-09 ENCOUNTER — Ambulatory Visit (INDEPENDENT_AMBULATORY_CARE_PROVIDER_SITE_OTHER): Payer: Commercial Managed Care - HMO | Admitting: Family Medicine

## 2022-06-09 ENCOUNTER — Encounter: Payer: Self-pay | Admitting: Family Medicine

## 2022-06-09 VITALS — BP 109/85 | HR 93 | Ht 67.75 in | Wt 118.2 lb

## 2022-06-09 DIAGNOSIS — Z0000 Encounter for general adult medical examination without abnormal findings: Secondary | ICD-10-CM

## 2022-06-09 DIAGNOSIS — F322 Major depressive disorder, single episode, severe without psychotic features: Secondary | ICD-10-CM | POA: Diagnosis not present

## 2022-06-09 DIAGNOSIS — Z681 Body mass index (BMI) 19 or less, adult: Secondary | ICD-10-CM

## 2022-06-09 DIAGNOSIS — F411 Generalized anxiety disorder: Secondary | ICD-10-CM

## 2022-06-09 MED ORDER — CARIPRAZINE HCL 1.5 MG PO CAPS
1.5000 mg | ORAL_CAPSULE | Freq: Every day | ORAL | 1 refills | Status: DC
Start: 2022-06-09 — End: 2022-06-25

## 2022-06-09 MED ORDER — ESCITALOPRAM OXALATE 10 MG PO TABS
10.0000 mg | ORAL_TABLET | Freq: Every day | ORAL | 1 refills | Status: DC
Start: 2022-06-09 — End: 2022-08-04

## 2022-06-09 NOTE — Assessment & Plan Note (Signed)
-   discussed ways to increase patient weight including protein shakes and premier protein drinks

## 2022-06-09 NOTE — Assessment & Plan Note (Signed)
-   pt has severe depression, will go ahead and try vraylar 1.5mg  and add lexapro to it  - follow up in two weeks for efficacy - did discuss side effects of vraylar  - she is seeing therapy and I encouraged her to keep going to therapy

## 2022-06-09 NOTE — Assessment & Plan Note (Signed)
-   pt has anxiety disorder. Discussed that wellbutrin may be making her anxiety worse even  - we discussed stopping the wellbutrin and starting lexapro with vraylar

## 2022-06-09 NOTE — Progress Notes (Signed)
New patient visit   Patient: Evelyn Goodwin   DOB: Dec 06, 1995   27 y.o. Female  MRN: [id number] Visit Date: 06/09/2022  Today's healthcare provider: Charlton Amor, DO   Chief Complaint  Patient presents with   New Patient (Initial Visit)    Patient in office - c/o  depression and anxiety x years. And difficulty falling and staying awake - patient had tried Zquil but had a bad reaction to this.     SUBJECTIVE    Chief Complaint  Patient presents with   New Patient (Initial Visit)    Patient in office - c/o  depression and anxiety x years. And difficulty falling and staying awake - patient had tried Zquil but had a bad reaction to this.    HPI   Pt presents to establish care. She has a pmh of depression and GAD. She says her depression is severe and she is worried her depression will get the best of her. She does not having any suicidal thoughts today. She is also concerned about her weight. She does have a prescription for xanax that she uses seldomly. She quantifies usage of about 1-2 xanax every couple months.   Review of Systems  Constitutional:  Negative for activity change, fatigue and fever.  Respiratory:  Negative for cough and shortness of breath.   Cardiovascular:  Negative for chest pain.  Gastrointestinal:  Negative for abdominal pain.  Genitourinary:  Negative for difficulty urinating.       Current Meds  Medication Sig   ALPRAZolam (XANAX) 0.25 MG tablet Take 0.25 mg by mouth 3 (three) times daily as needed.   cariprazine (VRAYLAR) 1.5 MG capsule Take 1 capsule (1.5 mg total) by mouth daily.   cetirizine (ZYRTEC) 10 MG tablet Take by mouth.   dicyclomine (BENTYL) 20 MG tablet Take 1 tablet (20 mg total) by mouth in the morning, at noon, in the evening, and at bedtime.   escitalopram (LEXAPRO) 10 MG tablet Take 1 tablet (10 mg total) by mouth daily.   fluticasone (FLONASE) 50 MCG/ACT nasal spray Place into the nose.   metoprolol succinate (TOPROL XL) 25  MG 24 hr tablet Take 1 tablet (25 mg total) by mouth at bedtime.   ondansetron (ZOFRAN-ODT) 4 MG disintegrating tablet Take by mouth.   Risankizumab-rzaa (SKYRIZI PEN) 150 MG/ML SOAJ Inject 150 mg into the skin as directed. Every 12 weeks for maintenance.   [DISCONTINUED] buPROPion (WELLBUTRIN XL) 300 MG 24 hr tablet Take 1 tablet by mouth daily.   [DISCONTINUED] citalopram (CELEXA) 10 MG tablet Take 1 tablet by mouth daily.   Current Facility-Administered Medications for the 06/09/22 encounter (Office Visit) with Charlton Amor, DO  Medication   propranolol (INDERAL) tablet 10 mg    OBJECTIVE    BP 109/85   Pulse 93   Ht 5' 7.75" (1.721 m)   Wt 118 lb 4 oz (53.6 kg)   SpO2 99%   BMI 18.11 kg/m   Physical Exam Vitals and nursing note reviewed.  Constitutional:      General: She is not in acute distress.    Appearance: Normal appearance.  HENT:     Head: Normocephalic and atraumatic.     Right Ear: External ear normal.     Left Ear: External ear normal.     Nose: Nose normal.  Eyes:     Conjunctiva/sclera: Conjunctivae normal.  Cardiovascular:     Rate and Rhythm: Normal rate and regular rhythm.  Pulmonary:  Effort: Pulmonary effort is normal.     Breath sounds: Normal breath sounds.  Neurological:     General: No focal deficit present.     Mental Status: She is alert and oriented to person, place, and time.  Psychiatric:        Mood and Affect: Mood normal.        Behavior: Behavior normal.        Thought Content: Thought content normal.        Judgment: Judgment normal.       ASSESSMENT & PLAN    Problem List Items Addressed This Visit       Other   BMI less than 19,adult    - discussed ways to increase patient weight including protein shakes and premier protein drinks       Current severe episode of major depressive disorder without psychotic features without prior episode (HCC)    - pt has severe depression, will go ahead and try vraylar 1.5mg  and add  lexapro to it  - follow up in two weeks for efficacy - did discuss side effects of vraylar  - she is seeing therapy and I encouraged her to keep going to therapy      Relevant Medications   cariprazine (VRAYLAR) 1.5 MG capsule   escitalopram (LEXAPRO) 10 MG tablet   GAD (generalized anxiety disorder)    - pt has anxiety disorder. Discussed that wellbutrin may be making her anxiety worse even  - we discussed stopping the wellbutrin and starting lexapro with vraylar      Relevant Medications   escitalopram (LEXAPRO) 10 MG tablet   Other Visit Diagnoses     Encounter for medical examination to establish care    -  Primary       Return in about 2 weeks (around 06/23/2022).      Meds ordered this encounter  Medications   cariprazine (VRAYLAR) 1.5 MG capsule    Sig: Take 1 capsule (1.5 mg total) by mouth daily.    Dispense:  30 capsule    Refill:  1   escitalopram (LEXAPRO) 10 MG tablet    Sig: Take 1 tablet (10 mg total) by mouth daily.    Dispense:  30 tablet    Refill:  1    No orders of the defined types were placed in this encounter.    Charlton Amor, DO  [facility] at [facility] [phone] (phone) [phone] (fax)  [facility]

## 2022-06-09 NOTE — Patient Instructions (Signed)
Stop wellbutrin  Start vraylar 1.5mg 

## 2022-06-10 ENCOUNTER — Encounter: Payer: Self-pay | Admitting: Family Medicine

## 2022-06-16 NOTE — Telephone Encounter (Signed)
Pt called. She is following up on her Vraylar.

## 2022-06-17 ENCOUNTER — Other Ambulatory Visit: Payer: Self-pay | Admitting: Family Medicine

## 2022-06-17 ENCOUNTER — Telehealth: Payer: Self-pay

## 2022-06-17 MED ORDER — ONDANSETRON 4 MG PO TBDP: 4.0000 mg | ORAL_TABLET | Freq: Three times a day (TID) | ORAL | 0 refills | Status: DC | PRN

## 2022-06-17 NOTE — Telephone Encounter (Signed)
Initiated Prior authorization RUE:[id number] 1.5MG  capsules  Via: Covermymeds Case/Key:[id number] Status: approved  as of 06/17/22 Reason:Member should be able to get the drug/product without a PA at this time.  Notified Pt via: Mychart

## 2022-06-24 NOTE — Progress Notes (Signed)
Established patient visit   Patient: Evelyn Goodwin   DOB: 1995/06/06   27 y.o. Female  MRN: [id number] Visit Date: 06/25/2022  Today's healthcare provider: Charlton Amor, DO   Chief Complaint  Patient presents with   Follow-up    Mood    SUBJECTIVE    Chief Complaint  Patient presents with   Follow-up    Mood   HPI  Pt presents for follow up on vraylar 1.5mg . She says it made her sick so she stopped it. She has a fear that her depression is going to get the bet of her.   Pt requests refill on zofran. She was given 20 tablets on 06/17/2022  Review of Systems  Constitutional:  Negative for activity change, fatigue and fever.  Respiratory:  Negative for cough and shortness of breath.   Cardiovascular:  Negative for chest pain.  Gastrointestinal:  Negative for abdominal pain.  Genitourinary:  Negative for difficulty urinating.       Current Meds  Medication Sig   ALPRAZolam (XANAX) 0.25 MG tablet Take 0.25 mg by mouth 3 (three) times daily as needed.   cetirizine (ZYRTEC) 10 MG tablet Take by mouth.   dicyclomine (BENTYL) 20 MG tablet Take 1 tablet (20 mg total) by mouth in the morning, at noon, in the evening, and at bedtime.   escitalopram (LEXAPRO) 10 MG tablet Take 1 tablet (10 mg total) by mouth daily.   fluticasone (FLONASE) 50 MCG/ACT nasal spray Place into the nose.   metoprolol succinate (TOPROL XL) 25 MG 24 hr tablet Take 1 tablet (25 mg total) by mouth at bedtime.   Risankizumab-rzaa (SKYRIZI PEN) 150 MG/ML SOAJ Inject 150 mg into the skin as directed. Every 12 weeks for maintenance.   [DISCONTINUED] ondansetron (ZOFRAN-ODT) 4 MG disintegrating tablet Take 1 tablet (4 mg total) by mouth every 8 (eight) hours as needed for nausea or vomiting.   Current Facility-Administered Medications for the 06/25/22 encounter (Office Visit) with Charlton Amor, DO  Medication   propranolol (INDERAL) tablet 10 mg    OBJECTIVE    BP (!) 115/93 (BP Location: Left  Arm, Patient Position: Sitting, Cuff Size: Normal)   Pulse 79   Ht 5' 7.75" (1.721 m)   Wt 118 lb (53.5 kg)   SpO2 99%   BMI 18.07 kg/m   Physical Exam Vitals and nursing note reviewed.  Constitutional:      General: She is not in acute distress.    Appearance: Normal appearance.  HENT:     Head: Normocephalic and atraumatic.     Right Ear: External ear normal.     Left Ear: External ear normal.     Nose: Nose normal.  Eyes:     Conjunctiva/sclera: Conjunctivae normal.  Cardiovascular:     Rate and Rhythm: Normal rate and regular rhythm.  Pulmonary:     Effort: Pulmonary effort is normal.     Breath sounds: Normal breath sounds.  Neurological:     General: No focal deficit present.     Mental Status: She is alert and oriented to person, place, and time.  Psychiatric:        Mood and Affect: Mood normal.        Behavior: Behavior normal.        Thought Content: Thought content normal.        Judgment: Judgment normal.        ASSESSMENT & PLAN    Problem List Items Addressed This  Visit       Digestive   Nausea and vomiting    - pt needs refill on zofran. She is almost out of zofran from her 20 tablets given last week. She has had extreme vomiting and nausea. We discussed medication adherence and she has already reached out to her GI doctor for this. I wonder if she is having a gastroenteritis       Relevant Medications   ondansetron (ZOFRAN-ODT) 4 MG disintegrating tablet   Gastroenteritis   Relevant Medications   ondansetron (ZOFRAN-ODT) 4 MG disintegrating tablet     Other   Current moderate episode of major depressive disorder without prior episode (HCC) - Primary    - depression is still not where it should be. She is fearful one day her depression will take over her. She denies any suicidal thoughts today and has no plan. She is talking to therapy and they discussed doing EMDR therapy - have also sent referral to psychiatry to see if we can get better  medication coverage for her depression. - had side effects from vraylar so advised pt to stop taking      Relevant Orders   Ambulatory referral to Psychiatry    No follow-ups on file.      Meds ordered this encounter  Medications   ondansetron (ZOFRAN-ODT) 4 MG disintegrating tablet    Sig: Take 1 tablet (4 mg total) by mouth every 8 (eight) hours as needed for nausea or vomiting.    Dispense:  20 tablet    Refill:  0    Orders Placed This Encounter  Procedures   Ambulatory referral to Psychiatry    Referral Priority:   Routine    Referral Type:   Psychiatric    Referral Reason:   Specialty Services Required    Requested Specialty:   Psychiatry    Number of Visits Requested:   1     Charlton Amor, DO  [facility] at [facility] [phone] (phone) [phone] (fax)  [facility]

## 2022-06-25 ENCOUNTER — Encounter: Payer: Self-pay | Admitting: Family Medicine

## 2022-06-25 ENCOUNTER — Ambulatory Visit (INDEPENDENT_AMBULATORY_CARE_PROVIDER_SITE_OTHER): Payer: 59 | Admitting: Family Medicine

## 2022-06-25 ENCOUNTER — Telehealth: Payer: Self-pay

## 2022-06-25 VITALS — BP 115/93 | HR 79 | Ht 67.75 in | Wt 118.0 lb

## 2022-06-25 DIAGNOSIS — K529 Noninfective gastroenteritis and colitis, unspecified: Secondary | ICD-10-CM | POA: Diagnosis not present

## 2022-06-25 DIAGNOSIS — R112 Nausea with vomiting, unspecified: Secondary | ICD-10-CM

## 2022-06-25 DIAGNOSIS — F321 Major depressive disorder, single episode, moderate: Secondary | ICD-10-CM | POA: Insufficient documentation

## 2022-06-25 MED ORDER — ONDANSETRON 4 MG PO TBDP
4.0000 mg | ORAL_TABLET | Freq: Three times a day (TID) | ORAL | 0 refills | Status: DC | PRN
Start: 2022-06-25 — End: 2022-09-14

## 2022-06-25 NOTE — Assessment & Plan Note (Signed)
-   pt needs refill on zofran. She is almost out of zofran from her 20 tablets given last week. She has had extreme vomiting and nausea. We discussed medication adherence and she has already reached out to her GI doctor for this. I wonder if she is having a gastroenteritis

## 2022-06-25 NOTE — Telephone Encounter (Signed)
Left message on machine to call back  

## 2022-06-25 NOTE — Telephone Encounter (Signed)
Good evening, I recently started vraylar and took it for one day and it made me sick as a dog two rounds of throwing up in the night within 2 hours and level six bowel movements. I have been taking Pepto and Zofran to see if it would help with the pain. My abdomen keeps cramping and it is essential and localized where my appendix used to be located. Tonight after dinner I felt like I was going to be sick and I ate pork. If you reference back to the CT that was done [place] ordered before she left the practice. Everything came back clean. I am open to really anything. I know a colonoscopy has been put on the table as an option, I am not really wanting to do one as we speak, I am open to any other ideas that you may recommend. Let me know.

## 2022-06-25 NOTE — Assessment & Plan Note (Addendum)
-   depression is still not where it should be. She is fearful one day her depression will take over her. She denies any suicidal thoughts today and has no plan. She is talking to therapy and they discussed doing EMDR therapy - have also sent referral to psychiatry to see if we can get better medication coverage for her depression. - had side effects from vraylar so advised pt to stop taking

## 2022-06-28 NOTE — Telephone Encounter (Signed)
I have spoken to the pt and she tells me that her symptoms have resolved since stopping vraylar.  She would like a follow up with Dr Lavon Paganini for follow up. She has been scheduled for Aug. 28 at 910 am.  She will call if needed in the meantime.

## 2022-07-02 ENCOUNTER — Encounter: Payer: Self-pay | Admitting: Family Medicine

## 2022-07-05 ENCOUNTER — Other Ambulatory Visit: Payer: Self-pay | Admitting: Family Medicine

## 2022-07-05 DIAGNOSIS — F411 Generalized anxiety disorder: Secondary | ICD-10-CM

## 2022-07-05 DIAGNOSIS — F322 Major depressive disorder, single episode, severe without psychotic features: Secondary | ICD-10-CM

## 2022-07-05 MED ORDER — BUPROPION HCL ER (XL) 300 MG PO TB24
300.0000 mg | ORAL_TABLET | Freq: Every day | ORAL | 1 refills | Status: DC
  Filled 2022-11-23: qty 30, 30d supply, fill #0

## 2022-07-08 ENCOUNTER — Other Ambulatory Visit (HOSPITAL_COMMUNITY): Payer: Self-pay

## 2022-07-08 ENCOUNTER — Other Ambulatory Visit: Payer: Self-pay | Admitting: Pharmacist

## 2022-07-08 MED ORDER — SKYRIZI PEN 150 MG/ML ~~LOC~~ SOAJ
150.0000 mg | SUBCUTANEOUS | 1 refills | Status: DC
  Filled 2022-07-08 – 2022-07-16 (×4): qty 1, 84d supply, fill #0
  Filled 2022-09-16: qty 1, 84d supply, fill #1

## 2022-07-09 ENCOUNTER — Encounter (HOSPITAL_COMMUNITY): Payer: Self-pay

## 2022-07-09 ENCOUNTER — Other Ambulatory Visit (HOSPITAL_COMMUNITY): Payer: Self-pay

## 2022-07-09 ENCOUNTER — Other Ambulatory Visit: Payer: Self-pay

## 2022-07-12 ENCOUNTER — Encounter (HOSPITAL_COMMUNITY): Payer: Self-pay

## 2022-07-12 ENCOUNTER — Other Ambulatory Visit (HOSPITAL_COMMUNITY): Payer: Self-pay

## 2022-07-13 ENCOUNTER — Telehealth: Payer: Self-pay | Admitting: *Deleted

## 2022-07-13 MED ORDER — ATENOLOL 25 MG PO TABS: 25.0000 mg | ORAL_TABLET | Freq: Every day | ORAL | 3 refills | Status: DC

## 2022-07-13 NOTE — Telephone Encounter (Signed)
Patient spoke with dr Jens Som and her metoprolol is keeping her up at night and would like to try a different beta blocker. Patient will stop her metoprolol and start atenolol 25 mg once daily. New script sent to the pharmacy

## 2022-07-14 ENCOUNTER — Other Ambulatory Visit (HOSPITAL_COMMUNITY): Payer: Self-pay

## 2022-07-14 ENCOUNTER — Encounter (HOSPITAL_COMMUNITY): Payer: Self-pay

## 2022-07-15 ENCOUNTER — Other Ambulatory Visit (HOSPITAL_COMMUNITY): Payer: Self-pay

## 2022-07-16 ENCOUNTER — Encounter (HOSPITAL_COMMUNITY): Payer: Self-pay

## 2022-07-16 ENCOUNTER — Other Ambulatory Visit: Payer: Self-pay

## 2022-07-16 ENCOUNTER — Other Ambulatory Visit (HOSPITAL_COMMUNITY): Payer: Self-pay

## 2022-07-23 ENCOUNTER — Encounter: Payer: Self-pay | Admitting: Dermatology

## 2022-07-23 ENCOUNTER — Other Ambulatory Visit (HOSPITAL_COMMUNITY): Payer: Self-pay

## 2022-07-27 ENCOUNTER — Ambulatory Visit: Payer: 59 | Attending: Cardiology

## 2022-07-27 ENCOUNTER — Telehealth: Payer: Self-pay | Admitting: *Deleted

## 2022-07-27 DIAGNOSIS — R002 Palpitations: Secondary | ICD-10-CM

## 2022-07-27 MED ORDER — BISOPROLOL FUMARATE 5 MG PO TABS
2.5000 mg | ORAL_TABLET | Freq: Every day | ORAL | 3 refills | Status: DC
Start: 2022-07-27 — End: 2022-09-03

## 2022-07-27 NOTE — Telephone Encounter (Signed)
Patient came over to talk with dr Jens Som. She is having potential side effects from the atenolol. We will stop the atenolol and start bisoprolol 2.5 mg once daily. She will also wear a 14 day zio. Follow up will be scheduled after the monitor is complete.

## 2022-07-27 NOTE — Progress Notes (Unsigned)
Enrolled patient for a 14 day Zio XT  monitor to be mailed to patients home  °

## 2022-07-30 DIAGNOSIS — R002 Palpitations: Secondary | ICD-10-CM

## 2022-08-04 ENCOUNTER — Encounter: Payer: Self-pay | Admitting: Dermatology

## 2022-08-04 ENCOUNTER — Ambulatory Visit (INDEPENDENT_AMBULATORY_CARE_PROVIDER_SITE_OTHER): Payer: 59 | Admitting: Dermatology

## 2022-08-04 ENCOUNTER — Ambulatory Visit (HOSPITAL_COMMUNITY): Payer: Self-pay | Admitting: Psychiatry

## 2022-08-04 VITALS — BP 107/68 | HR 98

## 2022-08-04 DIAGNOSIS — S90561A Insect bite (nonvenomous), right ankle, initial encounter: Secondary | ICD-10-CM

## 2022-08-04 DIAGNOSIS — L4050 Arthropathic psoriasis, unspecified: Secondary | ICD-10-CM

## 2022-08-04 DIAGNOSIS — W57XXXA Bitten or stung by nonvenomous insect and other nonvenomous arthropods, initial encounter: Secondary | ICD-10-CM | POA: Diagnosis not present

## 2022-08-04 DIAGNOSIS — L709 Acne, unspecified: Secondary | ICD-10-CM | POA: Diagnosis not present

## 2022-08-04 DIAGNOSIS — L409 Psoriasis, unspecified: Secondary | ICD-10-CM

## 2022-08-04 DIAGNOSIS — L700 Acne vulgaris: Secondary | ICD-10-CM

## 2022-08-04 DIAGNOSIS — S90562A Insect bite (nonvenomous), left ankle, initial encounter: Secondary | ICD-10-CM

## 2022-08-04 MED ORDER — CLOBETASOL PROPIONATE 0.05 % EX CREA: 1.0000 | TOPICAL_CREAM | Freq: Two times a day (BID) | CUTANEOUS | 2 refills | Status: DC

## 2022-08-04 NOTE — Patient Instructions (Addendum)
Thank you for visiting [place] on August 14th. We appreciate your commitment to managing your health and addressing the concerns with your recent flea bites.  Here are the key instructions from our consultation:  - Medication for Flea Bites: Apply Clobetasol cream to the affected areas twice daily, morning and night. This treatment is expected to clear the bites within three to five days. Please limit the use of Clobetasol to a maximum of two weeks.  - Psoriasis and Psoriatic Arthritis Management: Continue with your current regimen of Skyrizi as prescribed. This medication is crucial for managing your conditions and has been refilled as of June.  - Future Appointments: We discussed scheduling your next dermatology follow-up for your psoriasis in December. Please contact our office when you are ready to schedule this appointment.  We hope your new kittens bring you much joy without further health concerns. If you have any questions or need further assistance, please feel free to reach out.     Treatment Plan for acne:  -La Roche posay face effaclar duo to face every morning to spot treat.   Treatment Plan For Flea Bites: -Start clobetasol 0.05% cream to affected areas on feet/ legs twice a day for 2 weeks then stop.    Due to recent changes in healthcare laws, you may see results of your pathology and/or laboratory studies on MyChart before the doctors have had a chance to review them. We understand that in some cases there may be results that are confusing or concerning to you. Please understand that not all results are received at the same time and often the doctors may need to interpret multiple results in order to provide you with the best plan of care or course of treatment. Therefore, we ask that you please give [place] 2 business days to thoroughly review all your results before contacting the office for clarification. Should we see a critical lab result, you will be contacted sooner.   If  You Need Anything After Your Visit  If you have any questions or concerns for your doctor, please call our main line at [phone] If no one answers, please leave a voicemail as directed and we will return your call as soon as possible. Messages left after 4 pm will be answered the following business day.   You may also send [place] a message via MyChart. We typically respond to MyChart messages within 1-2 business days.  For prescription refills, please ask your pharmacy to contact our office. Our fax number is [phone].  If you have an urgent issue when the clinic is closed that cannot wait until the next business day, you can page your doctor at the number below.    Please note that while we do our best to be available for urgent issues outside of office hours, we are not available 24/7.   If you have an urgent issue and are unable to reach [place], you may choose to seek medical care at your doctor's office, retail clinic, urgent care center, or emergency room.  If you have a medical emergency, please immediately call 911 or go to the emergency department. In the event of inclement weather, please call our main line at [phone] for an update on the status of any delays or closures.  Dermatology Medication Tips: Please keep the boxes that topical medications come in in order to help keep track of the instructions about where and how to use these. Pharmacies typically print the medication instructions only on the boxes and  not directly on the medication tubes.   If your medication is too expensive, please contact our office at [phone] or send [place] a message through MyChart.   We are unable to tell what your co-pay for medications will be in advance as this is different depending on your insurance coverage. However, we may be able to find a substitute medication at lower cost or fill out paperwork to get insurance to cover a needed medication.   If a prior authorization is required to get  your medication covered by your insurance company, please allow [place] 1-2 business days to complete this process.  Drug prices often vary depending on where the prescription is filled and some pharmacies may offer cheaper prices.  The website www.goodrx.com contains coupons for medications through different pharmacies. The prices here do not account for what the cost may be with help from insurance (it may be cheaper with your insurance), but the website can give you the price if you did not use any insurance.  - You can print the associated coupon and take it with your prescription to the pharmacy.  - You may also stop by our office during regular business hours and pick up a GoodRx coupon card.  - If you need your prescription sent electronically to a different pharmacy, notify our office through [facility] or by phone at [phone]

## 2022-08-04 NOTE — Progress Notes (Signed)
     Follow-Up Visit   Subjective  Evelyn Goodwin is a 27 y.o. female who presents for the following: flea bites  Patient states she has bites located at the ankles that she would like to have examined. Patient reports the areas have been there for around 2 weeks. she reports the areas are bothersome. She states that the areas are itchy and she has been applying over the counter hydrocortisone without relief. Patient reports she has not previously been treated for these areas. Patient has no Hx of skin cancer Patient has no  family history of skin cancer(s).     The following portions of the chart were reviewed this encounter and updated as appropriate: medications, allergies, medical history  Review of Systems:  No other skin or systemic complaints except as noted in HPI or Assessment and Plan.  Objective  Well appearing patient in no apparent distress; mood and affect are within normal limits.   A focused examination was performed of the following areas: Feet and ankles  Relevant exam findings are noted in the Assessment and Plan.  Exam: pink papules feet and ankles Exam: Open comedones and inflammatory papules on face         Assessment & Plan    1. Flea Bites - Plan: Prescribe clobetasol to be applied twice daily for up to two weeks. Expect improvement within 3-5 days. Educate the patient on the safe use of clobetasol and its limited systemic absorption.  2. Psoriasis and Psoriatic Arthritis - Plan: Continue Skyrizi as prescribed. Schedule a follow-up visit in the winter or as needed. Discuss the possibility of switching to Cimzia if flare-ups occur. Recommend trying Voltaren, a topical NSAID, for joint pain relief.  3. Acne - Plan: Recommend a benzoyl peroxide spot treatment by Michaelle Birks for morning use and salicylic acid at night. If this doesn't work, consider a full regimen in a separate visit. Advise the patient to wash with a white towel to avoid residue  from the benzoyl peroxide spot treatment.  Follow-up as needed for any unresolved or worsening issues.    Return in about 6 months (around 02/04/2023) for PSORIASIS f/u.  I, Tillie Fantasia, CMA, am acting as scribe for [employer], DO.   Documentation: I have reviewed the above documentation for accuracy and completeness, and I agree with the above.  Langston Reusing, DO

## 2022-08-05 ENCOUNTER — Ambulatory Visit (HOSPITAL_COMMUNITY): Payer: Self-pay | Admitting: Psychiatry

## 2022-08-14 DIAGNOSIS — R002 Palpitations: Secondary | ICD-10-CM | POA: Diagnosis not present

## 2022-08-30 ENCOUNTER — Other Ambulatory Visit (HOSPITAL_COMMUNITY): Payer: Self-pay

## 2022-08-30 NOTE — Progress Notes (Signed)
Cardiology Clinic Note   Patient Name: Evelyn Goodwin Date of Encounter: 09/03/2022  Primary Care Provider:  Charlton Amor, DO Primary Cardiologist:  Olga Millers, MD  Patient Profile    27 year old female patient with history of palpitations, NSVT.  Last seen by Dr. Jens Som on 05/31/2022.  She was started on metoprolol 25 mg at night to see if this improved her symptoms.    Past Medical History    Past Medical History:  Diagnosis Date   Anxiety    Clostridium difficile infection    Depression    Psoriasis    Scoliosis    Past Surgical History:  Procedure Laterality Date   LAPAROSCOPIC APPENDECTOMY N/A 05/07/2019   Procedure: APPENDECTOMY LAPAROSCOPIC;  Surgeon: Violeta Gelinas, MD;  Location: [facility] OR;  Service: General;  Laterality: N/A;   MOUTH SURGERY     27 year old molers removed   WISDOM TOOTH EXTRACTION  2014    Allergies  Allergies  Allergen Reactions   Diphenhydramine Hcl Other (See Comments)    Other Reaction(s): Psychosis  Sleep paralysis   Metoprolol Other (See Comments)    Frequent urination.    Vraylar [Cariprazine] Nausea And Vomiting   Cardizem [Diltiazem] Palpitations   Penicillin G Rash   Vancomycin Rash    History of Present Illness    Evelyn Goodwin comes today for ongoing assessment management of palpitations.  When last seen by Dr. Jens Som in May 2024 she was started on metoprolol to help with the symptoms.  This has been changed to bisoprolol as she was unable to tolerate metoprolol.  She is here for follow-up to evaluate her response to medication regimen.  Continues to have palpitations and feelings of her heart racing at times.  Usually associated with anxiety and stress.  She has had some traumatic episodes recently that are causing her to have significant depression and anxiety and feelings of impending doom.  She is being followed by a psychotherapist.   Home Medications    Current Outpatient Medications  Medication Sig  Dispense Refill   ALPRAZolam (XANAX) 0.25 MG tablet Take 0.25 mg by mouth 3 (three) times daily as needed.     bisoprolol (ZEBETA) 5 MG tablet Take 0.5 tablets (2.5 mg total) by mouth daily. 45 tablet 3   buPROPion (WELLBUTRIN XL) 300 MG 24 hr tablet Take 1 tablet (300 mg total) by mouth daily. 30 tablet 1   cetirizine (ZYRTEC) 10 MG tablet Take by mouth.     clobetasol cream (TEMOVATE) 0.05 % Apply 1 Application topically 2 (two) times daily. 30 g 2   dicyclomine (BENTYL) 20 MG tablet Take 1 tablet (20 mg total) by mouth in the morning, at noon, in the evening, and at bedtime. (Patient taking differently: Take 20 mg by mouth as needed for spasms.) 360 tablet 3   fluticasone (FLONASE) 50 MCG/ACT nasal spray Place 1 spray into both nostrils as needed.     ondansetron (ZOFRAN-ODT) 4 MG disintegrating tablet Take 1 tablet (4 mg total) by mouth every 8 (eight) hours as needed for nausea or vomiting. 20 tablet 0   risankizumab-rzaa (SKYRIZI PEN) 150 MG/ML pen Inject 1 mL (150 mg total) into the skin as directed. Every 12 weeks for maintenance. 1 mL 1   No current facility-administered medications for this visit.     Family History    Family History  Problem Relation Age of Onset   Psoriasis Mother    Healthy Brother    Hypertension Other  Heart attack Other    Breast cancer Other    Colon cancer Neg Hx    Esophageal cancer Neg Hx    Pancreatic cancer Neg Hx    Stomach cancer Neg Hx    Rectal cancer Neg Hx    She indicated that her mother is alive. She indicated that her father is alive. She indicated that her brother is alive. She indicated that the status of her neg hx is unknown. She indicated that the status of her other is unknown.  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 [employer]    Occupation: Engineer, site  Tobacco Use   Smoking status: Never   Smokeless tobacco: Never  Vaping Use   Vaping status: Former  Substance and Sexual Activity   Alcohol use: Yes    Comment: Rare   Drug use: Not Currently   Sexual activity: Yes    Birth control/protection: Surgical    Comment: vasectomy -  Other Topics Concern   Not on file  Social History Narrative   Not on file   Social Determinants of Health   Financial Resource Strain: Low Risk  (06/22/2022)   Overall Financial Resource Strain (CARDIA)    Difficulty of Paying Living Expenses: Not very hard  Food Insecurity: No Food Insecurity (06/22/2022)   Hunger Vital Sign    Worried About Running Out of Food in the Last Year: Never true    Ran Out of Food in the Last Year: Never true  Transportation Needs: No Transportation Needs (06/22/2022)   PRAPARE - Administrator, Civil Service (Medical): No    Lack of Transportation (Non-Medical): No  Physical Activity: Insufficiently Active (06/22/2022)   Exercise Vital Sign    Days of Exercise per Week: 3 days    Minutes of Exercise per Session: 30 min  Stress: No Stress Concern Present (06/22/2022)   [employer] of Occupational Health - Occupational Stress Questionnaire    Feeling of Stress : Only a little  Social Connections: Moderately Isolated (06/22/2022)   Social Connection and Isolation Panel [NHANES]    Frequency of Communication with Friends and Family: More than three times a week    Frequency of Social Gatherings with Friends and Family: Twice a week    Attends Religious Services: 1 to 4 times per year    Active Member of [employer] or Organizations: No    Attends Engineer, structural: Not on file    Marital Status: Never married  Intimate Partner Violence: Not on file     Review of Systems    General:  No chills, fever, night sweats or weight changes.  Cardiovascular:  No chest  pain, dyspnea on exertion, edema, orthopnea, palpitations, paroxysmal nocturnal dyspnea. Dermatological: No rash, lesions/masses Respiratory: No cough, dyspnea Urologic: No hematuria, dysuria Abdominal:   No nausea, vomiting, diarrhea, bright red blood per rectum, melena, or hematemesis Neurologic:  No visual changes, wkns, changes in mental status. All other systems reviewed and are otherwise negative except as noted above.       Physical Exam    VS:  BP 120/72   Pulse 84   Ht 5' 7.75" (1.721 m)  Wt 117 lb (53.1 kg)   SpO2 99%   BMI 17.92 kg/m  , BMI Body mass index is 17.92 kg/m.     GEN: Well nourished, well developed, in no acute distress. HEENT: normal. Neck: Supple, no JVD, carotid bruits, or masses. Cardiac: RRR, tachycardic, no murmurs, rubs, or gallops. No clubbing, cyanosis, edema.  Radials/DP/PT 2+ and equal bilaterally.  Respiratory:  Respirations regular and unlabored, clear to auscultation bilaterally. GI: Soft, nontender, nondistended, BS + x 4. MS: no deformity or atrophy. Skin: warm and dry, no rash. Neuro:  Strength and sensation are intact. Psych: Normal affect.Tearful at times.       Lab Results  Component Value Date   WBC 17.6 (H) 07/22/2019   HGB 13.0 07/22/2019   HCT 40.2 07/22/2019   MCV 84.5 07/22/2019   PLT 563 (H) 07/22/2019   Lab Results  Component Value Date   CREATININE 0.60 07/07/2021   BUN 14 07/07/2021   NA 137 07/07/2021   K 4.4 07/07/2021   CL 100 07/07/2021   CO2 26 07/07/2021   Lab Results  Component Value Date   ALT 12 07/22/2019   AST 18 07/22/2019   ALKPHOS 68 07/22/2019   BILITOT 0.3 07/22/2019   No results found for: "CHOL", "HDL", "LDLCALC", "LDLDIRECT", "TRIG", "CHOLHDL"  No results found for: "HGBA1C"   Review of Prior Studies Zio Monitor 08/16/2022    Patient had a min HR of 43 bpm, max HR of 160 bpm, and avg HR of 75 bpm. Predominant underlying rhythm was Sinus Rhythm. 1 run of SVT occurred lasting 7 beats  with a max rate of 112 bpm (avg 105 bpm). Isolated SVEs were rare (<1.0%),  and no SVE Couplets or SVE Triplets were present. Isolated VEs were rare (<1.0%), VE Couplets were rare (<1.0%), and no VE Triplets were present.    Sinus bradycardia, NSR, sinus tachycardia, rare PAC, 7 beats SVT, rare PVC. Olga Millers  Echocardiogram 02/19/2019 1. The average left ventricular global longitudinal strain is -23.6 %.   2. Left ventricular ejection fraction, by visual estimation, is 60 to  65%. The left ventricle has normal function. There is no left ventricular  hypertrophy.   3. The left ventricle has no regional wall motion abnormalities.   4. Global right ventricle has normal systolic function.The right  ventricular size is normal. No increase in right ventricular wall  thickness.   5. Left atrial size was normal.   6. Right atrial size was normal.   7. The mitral valve is normal in structure. No evidence of mitral valve  regurgitation. No evidence of mitral stenosis.   8. The tricuspid valve is normal in structure.   9. The tricuspid valve is normal in structure. Tricuspid valve  regurgitation is trivial.  10. The aortic valve is normal in structure. Aortic valve regurgitation is  not visualized. No evidence of aortic valve sclerosis or stenosis.  11. The pulmonic valve was normal in structure. Pulmonic valve  regurgitation is not visualized.  12. Normal pulmonary artery systolic pressure.  13. The inferior vena cava is normal in size with greater than 50%  respiratory variability, suggesting right atrial pressure of 3 mmHg.    Assessment & Plan   1.  PSVT: Episodes of PSVT were noted while wearing the Zio monitor in the early evening around 630 pm.  She is unaware of any triggering events. She will continue the bisoprolol as directed. She has had TSH drawn by PCP 02/12/2022. This was normal.  She is concerned if bisoprolol would be contraindicated in the setting of pregnancy. She is not  currently pregnant. She is reassured that bisoprolol is not contraindicated during pregnancy.  She is to avoid caffeine, stimulants and vapping.    2. Significant anxiety and depression: She is being seen by psychotherapist.  Would recommend therapist trained in trauma.  She has experienced significant traumatic event recently and is undergoing stressful events now with illness of family member. She continues on antidepressants and antianxiety medications.          Signed, Bettey Mare. Liborio Nixon, ANP, AACC   09/03/2022 11:04 AM      Office [phone] Fax [phone]  Notice: This dictation was prepared with Dragon dictation along with smaller phrase technology. Any transcriptional errors that result from this process are unintentional and may not be corrected upon review.

## 2022-09-03 ENCOUNTER — Encounter: Payer: Self-pay | Admitting: Adult Health

## 2022-09-03 ENCOUNTER — Other Ambulatory Visit (HOSPITAL_COMMUNITY): Payer: Self-pay

## 2022-09-03 ENCOUNTER — Ambulatory Visit: Payer: 59 | Attending: Adult Health | Admitting: Adult Health

## 2022-09-03 VITALS — BP 120/72 | HR 84 | Ht 67.75 in | Wt 117.0 lb

## 2022-09-03 DIAGNOSIS — F419 Anxiety disorder, unspecified: Secondary | ICD-10-CM

## 2022-09-03 DIAGNOSIS — N926 Irregular menstruation, unspecified: Secondary | ICD-10-CM | POA: Diagnosis not present

## 2022-09-03 DIAGNOSIS — R002 Palpitations: Secondary | ICD-10-CM | POA: Diagnosis not present

## 2022-09-03 DIAGNOSIS — Z113 Encounter for screening for infections with a predominantly sexual mode of transmission: Secondary | ICD-10-CM | POA: Diagnosis not present

## 2022-09-03 DIAGNOSIS — F32A Depression, unspecified: Secondary | ICD-10-CM

## 2022-09-03 DIAGNOSIS — Z01419 Encounter for gynecological examination (general) (routine) without abnormal findings: Secondary | ICD-10-CM | POA: Diagnosis not present

## 2022-09-03 DIAGNOSIS — Z681 Body mass index (BMI) 19 or less, adult: Secondary | ICD-10-CM | POA: Diagnosis not present

## 2022-09-03 MED ORDER — BISOPROLOL FUMARATE 5 MG PO TABS
2.5000 mg | ORAL_TABLET | Freq: Every day | ORAL | 3 refills | Status: AC
Start: 2022-09-03 — End: ?
  Filled 2022-09-03: qty 45, 90d supply, fill #0

## 2022-09-03 NOTE — Patient Instructions (Signed)
Medication Instructions:  No Changes *If you need a refill on your cardiac medications before your next appointment, please call your pharmacy*   Lab Work: No Labs If you have labs (blood work) drawn today and your tests are completely normal, you will receive your results only by: MyChart Message (if you have MyChart) OR A paper copy in the mail If you have any lab test that is abnormal or we need to change your treatment, we will call you to review the results.   Testing/Procedures: No Testing   Follow-Up: At [facility], you and your health needs are our priority.  As part of our continuing mission to provide you with exceptional heart care, we have created designated Provider Care Teams.  These Care Teams include your primary Cardiologist (physician) and Advanced Practice Providers (APPs -  Physician Assistants and Nurse Practitioners) who all work together to provide you with the care you need, when you need it.  We recommend signing up for the patient portal called "MyChart".  Sign up information is provided on this After Visit Summary.  MyChart is used to connect with patients for Virtual Visits (Telemedicine).  Patients are able to view lab/test results, encounter notes, upcoming appointments, etc.  Non-urgent messages can be sent to your provider as well.   To learn more about what you can do with MyChart, go to [email or web address].    Your next appointment:   6 month(s)  Provider:   Joni Reining, DNP, ANP    or, Olga Millers, MD

## 2022-09-10 ENCOUNTER — Telehealth: Payer: Self-pay | Admitting: Family Medicine

## 2022-09-10 NOTE — Telephone Encounter (Signed)
Returned pts call and she was asking for something stronger, I asked Dr. Karie Schwalbe and he said that she was on the strongest steroid creme and that she may need to come in to be re evaluated, I scheduled her to come in to see Dr. Lovett Sox on next tues. She is also going to reach out to her dermatologist on Monday. Their office was closed today. Evelyn Goodwin, CMA

## 2022-09-10 NOTE — Telephone Encounter (Signed)
Patient called and ask if she could get something stronger than clobetasol cream [facility] [phone]

## 2022-09-14 ENCOUNTER — Ambulatory Visit: Payer: 59 | Admitting: Family Medicine

## 2022-09-14 ENCOUNTER — Encounter: Payer: Self-pay | Admitting: Family Medicine

## 2022-09-14 ENCOUNTER — Other Ambulatory Visit (HOSPITAL_COMMUNITY): Payer: Self-pay

## 2022-09-14 VITALS — BP 115/85 | HR 82 | Resp 20 | Ht 67.75 in | Wt 116.5 lb

## 2022-09-14 DIAGNOSIS — W57XXXD Bitten or stung by nonvenomous insect and other nonvenomous arthropods, subsequent encounter: Secondary | ICD-10-CM

## 2022-09-14 DIAGNOSIS — S70369D Insect bite (nonvenomous), unspecified thigh, subsequent encounter: Secondary | ICD-10-CM

## 2022-09-14 DIAGNOSIS — S70369A Insect bite (nonvenomous), unspecified thigh, initial encounter: Secondary | ICD-10-CM | POA: Insufficient documentation

## 2022-09-14 DIAGNOSIS — R112 Nausea with vomiting, unspecified: Secondary | ICD-10-CM

## 2022-09-14 MED ORDER — HYDROXYZINE HCL 10 MG PO TABS
10.0000 mg | ORAL_TABLET | Freq: Three times a day (TID) | ORAL | 0 refills | Status: DC | PRN
  Filled 2022-09-14: qty 30, 10d supply, fill #0

## 2022-09-14 MED ORDER — PREDNISONE 20 MG PO TABS
20.0000 mg | ORAL_TABLET | Freq: Every day | ORAL | 0 refills | Status: AC
  Filled 2022-09-14: qty 4, 4d supply, fill #0

## 2022-09-14 MED ORDER — ONDANSETRON 4 MG PO TBDP
4.0000 mg | ORAL_TABLET | Freq: Three times a day (TID) | ORAL | 0 refills | Status: AC | PRN
Start: 2022-09-14 — End: ?
  Filled 2022-09-14: qty 20, 7d supply, fill #0

## 2022-09-14 MED ORDER — TRIAMCINOLONE ACETONIDE 0.5 % EX CREA
1.0000 | TOPICAL_CREAM | Freq: Two times a day (BID) | CUTANEOUS | 3 refills | Status: DC
  Filled 2022-09-14: qty 30, 15d supply, fill #0

## 2022-09-14 MED ORDER — METHYLPREDNISOLONE SODIUM SUCC 40 MG IJ SOLR
40.0000 mg | Freq: Once | INTRAMUSCULAR | Status: AC
Start: 2022-09-14 — End: 2022-09-14
  Administered 2022-09-14: 40 mg via INTRAMUSCULAR

## 2022-09-14 NOTE — Progress Notes (Signed)
Acute Office Visit  Subjective:     Patient ID: Evelyn Goodwin, female    DOB: [birth year], 27 y.o.   MRN: [id number]  Chief Complaint  Patient presents with   Insect Bite    Flea bites x1wk    HPI Patient is in today for acute skin rash consistent with flea bites. She has been using clobetasol cream on them and is not getting any relief. She was seen about a month ago by dermatology for this concern and give clobetasol cream. Says her cats have fleas again she has them. Admits to itching.   Review of Systems  Constitutional:  Negative for chills and fever.  Respiratory:  Negative for cough and shortness of breath.   Cardiovascular:  Negative for chest pain.  Skin:  Positive for rash.  Neurological:  Negative for headaches.        Objective:    BP 115/85 (BP Location: Left Arm, Patient Position: Sitting, Cuff Size: Normal)   Pulse 82   Resp 20   Ht 5' 7.75" (1.721 m)   Wt 116 lb 8 oz (52.8 kg)   SpO2 99%   BMI 17.84 kg/m    Physical Exam Vitals and nursing note reviewed.  Constitutional:      General: She is not in acute distress.    Appearance: Normal appearance.  HENT:     Head: Normocephalic and atraumatic.     Right Ear: External ear normal.     Left Ear: External ear normal.     Nose: Nose normal.  Eyes:     Conjunctiva/sclera: Conjunctivae normal.  Cardiovascular:     Rate and Rhythm: Normal rate and regular rhythm.  Pulmonary:     Effort: Pulmonary effort is normal.  Skin:    Comments: Rash on lower legs b/l  Neurological:     General: No focal deficit present.     Mental Status: She is alert and oriented to person, place, and time.  Psychiatric:        Mood and Affect: Mood normal.        Behavior: Behavior normal.        Thought Content: Thought content normal.        Judgment: Judgment normal.     No results found for any visits on 09/14/22.      Assessment & Plan:   Problem List Items Addressed This Visit       Digestive    Nausea and vomiting   Relevant Medications   ondansetron (ZOFRAN-ODT) 4 MG disintegrating tablet     Musculoskeletal and Integument   Insect bite of thigh - Primary    - given solumedrol in clinic today - given prednisone for a few days to help  - changed cream to triamcinolone  - given hydroxyzine for itching        Meds ordered this encounter  Medications   triamcinolone cream (KENALOG) 0.5 %    Sig: Apply 1 Application topically 2 (two) times daily. To affected areas.    Dispense:  30 g    Refill:  3   predniSONE (DELTASONE) 20 MG tablet    Sig: Take 1 tablet (20 mg total) by mouth daily with breakfast for 4 days.    Dispense:  4 tablet    Refill:  0   ondansetron (ZOFRAN-ODT) 4 MG disintegrating tablet    Sig: Take 1 tablet (4 mg total) by mouth every 8 (eight) hours as needed for nausea or vomiting.  Dispense:  20 tablet    Refill:  0   hydrOXYzine (ATARAX) 10 MG tablet    Sig: Take 1 tablet (10 mg total) by mouth 3 (three) times daily as needed for itching.    Dispense:  30 tablet    Refill:  0   methylPREDNISolone sodium succinate (SOLU-MEDROL) 40 mg/mL injection 40 mg    No follow-ups on file.  Charlton Amor, DO

## 2022-09-14 NOTE — Assessment & Plan Note (Signed)
-   given solumedrol in clinic today - given prednisone for a few days to help  - changed cream to triamcinolone  - given hydroxyzine for itching

## 2022-09-15 ENCOUNTER — Ambulatory Visit: Payer: 59 | Admitting: Gastroenterology

## 2022-09-15 ENCOUNTER — Other Ambulatory Visit (HOSPITAL_COMMUNITY): Payer: Self-pay

## 2022-09-16 ENCOUNTER — Other Ambulatory Visit (HOSPITAL_COMMUNITY): Payer: Self-pay

## 2022-09-16 ENCOUNTER — Other Ambulatory Visit: Payer: Self-pay

## 2022-09-22 ENCOUNTER — Ambulatory Visit (INDEPENDENT_AMBULATORY_CARE_PROVIDER_SITE_OTHER): Payer: 59 | Admitting: Family Medicine

## 2022-09-22 ENCOUNTER — Encounter: Payer: Self-pay | Admitting: Family Medicine

## 2022-09-22 VITALS — BP 117/80 | HR 70 | Ht 67.75 in | Wt 117.2 lb

## 2022-09-22 DIAGNOSIS — Z0000 Encounter for general adult medical examination without abnormal findings: Secondary | ICD-10-CM | POA: Insufficient documentation

## 2022-09-22 DIAGNOSIS — Z23 Encounter for immunization: Secondary | ICD-10-CM

## 2022-09-22 NOTE — Progress Notes (Signed)
Established patient visit   Patient: Evelyn Goodwin   DOB: [birth year]   26 y.o. Female  MRN: [id number] Visit Date: 09/22/2022  Today's healthcare provider: Charlton Amor, DO   Chief Complaint  Patient presents with   Annual Exam    SUBJECTIVE    Chief Complaint  Patient presents with   Annual Exam   HPI  Pt presents for wellness visit.   Pt doing well Diet: eats little, trying to eat more Exercise: is active at work   Feels like the hydroxyzine given for flea bites has helped her anxiety and depression at night and help her sleep.   Would like to discuss ways to increase weight  Review of Systems  Constitutional:  Negative for activity change, fatigue and fever.  Respiratory:  Negative for cough and shortness of breath.   Cardiovascular:  Negative for chest pain.  Gastrointestinal:  Negative for abdominal pain.  Genitourinary:  Negative for difficulty urinating.       Current Meds  Medication Sig   ALPRAZolam (XANAX) 0.25 MG tablet Take 0.25 mg by mouth 3 (three) times daily as needed.   bisoprolol (ZEBETA) 5 MG tablet Take 0.5 tablets (2.5 mg total) by mouth daily.   buPROPion (WELLBUTRIN XL) 300 MG 24 hr tablet Take 1 tablet (300 mg total) by mouth daily.   cetirizine (ZYRTEC) 10 MG tablet Take by mouth.   dicyclomine (BENTYL) 20 MG tablet Take 1 tablet (20 mg total) by mouth in the morning, at noon, in the evening, and at bedtime. (Patient taking differently: Take 20 mg by mouth as needed for spasms.)   fluticasone (FLONASE) 50 MCG/ACT nasal spray Place 1 spray into both nostrils as needed.   hydrOXYzine (ATARAX) 10 MG tablet Take 1 tablet (10 mg total) by mouth 3 (three) times daily as needed for itching.   ondansetron (ZOFRAN-ODT) 4 MG disintegrating tablet Take 1 tablet (4 mg total) by mouth every 8 (eight) hours as needed for nausea or vomiting.   risankizumab-rzaa (SKYRIZI PEN) 150 MG/ML pen Inject 1 mL (150 mg total) into the skin as directed.  Every 12 weeks for maintenance.   triamcinolone cream (KENALOG) 0.5 % Apply 1 Application topically 2 (two) times daily. To affected areas.    OBJECTIVE    BP 117/80   Pulse 70   Ht 5' 7.75" (1.721 m)   Wt 117 lb 4 oz (53.2 kg)   SpO2 99%   BMI 17.96 kg/m   Physical Exam Vitals and nursing note reviewed.  Constitutional:      General: She is not in acute distress.    Appearance: Normal appearance.  HENT:     Head: Normocephalic and atraumatic.     Right Ear: External ear normal.     Left Ear: External ear normal.     Nose: Nose normal.  Eyes:     Conjunctiva/sclera: Conjunctivae normal.  Cardiovascular:     Rate and Rhythm: Normal rate and regular rhythm.  Pulmonary:     Effort: Pulmonary effort is normal.     Breath sounds: Normal breath sounds.  Abdominal:     General: Abdomen is flat. Bowel sounds are normal.     Palpations: Abdomen is soft.  Neurological:     General: No focal deficit present.     Mental Status: She is alert and oriented to person, place, and time.  Psychiatric:        Mood and Affect: Mood normal.  Behavior: Behavior normal.        Thought Content: Thought content normal.        Judgment: Judgment normal.        ASSESSMENT & PLAN    Problem List Items Addressed This Visit       Other   Routine adult health maintenance - Primary    - ordered blood work  - created meal plan to help gain weight. Follow up in one month for weight check - pt trying to conceive   Meal Plan  Breakfast: bagel and cream cheese with egg (Can also do oatmeal with fruit)   Mid morning snack: protein bar/ handful of trail mix/ chobani yogurt drink   Lunch: from home or drug rep   Mid afternoon snack: protein bar/ handful of trail mix/ chobani yogurt drink   Dinner - home - get some stuff at home for quick meals: frozen shrimp from whole foods that you can quickly defrost, box of mac and cheese   Snack after dinner (we can hold off on this for now)        Relevant Orders   CBC   Lipid panel   Basic Metabolic Panel (BMET)   Other Visit Diagnoses     Encounter for immunization       Relevant Orders   Flu vaccine trivalent PF, 6mos and older(Flulaval,Afluria,Fluarix,Fluzone) (Completed)       Return in about 4 weeks (around 10/20/2022) for weight check.      No orders of the defined types were placed in this encounter.   Orders Placed This Encounter  Procedures   Flu vaccine trivalent PF, 6mos and older(Flulaval,Afluria,Fluarix,Fluzone)   CBC   Lipid panel   Basic Metabolic Panel (BMET)     Charlton Amor, DO  [facility] at [facility] [phone] (phone) [phone] (fax)  [facility]

## 2022-09-22 NOTE — Assessment & Plan Note (Addendum)
-   ordered blood work  - created meal plan to help gain weight. Follow up in one month for weight check - pt trying to conceive   Meal Plan  Breakfast: bagel and cream cheese with egg (Can also do oatmeal with fruit)   Mid morning snack: protein bar/ handful of trail mix/ chobani yogurt drink   Lunch: from home or drug rep   Mid afternoon snack: protein bar/ handful of trail mix/ chobani yogurt drink   Dinner - home - get some stuff at home for quick meals: frozen shrimp from whole foods that you can quickly defrost, box of mac and cheese   Snack after dinner (we can hold off on this for now)

## 2022-09-22 NOTE — Patient Instructions (Signed)
Meal Plan  Breakfast: bagel and cream cheese with egg (Can also do oatmeal with fruit)   Mid morning snack: protein bar/ handful of trail mix/ chobani yogurt drink   Lunch: from home or drug rep   Mid afternoon snack: protein bar/ handful of trail mix/ chobani yogurt drink   Dinner - home - get some stuff at home for quick meals: frozen shrimp from whole foods that you can quickly defrost, box of mac and cheese   Snack after dinner (we can hold off on this for now)

## 2022-09-23 LAB — CBC
Hematocrit: 43.6 % (ref 34.0–46.6)
Hemoglobin: 14.4 g/dL (ref 11.1–15.9)
MCH: 29.6 pg (ref 26.6–33.0)
MCHC: 33 g/dL (ref 31.5–35.7)
MCV: 90 fL (ref 79–97)
Platelets: 399 10*3/uL (ref 150–450)
RBC: 4.87 x10E6/uL (ref 3.77–5.28)
RDW: 11.7 % (ref 11.7–15.4)
WBC: 8 10*3/uL (ref 3.4–10.8)

## 2022-09-23 LAB — LIPID PANEL
Chol/HDL Ratio: 2.3 ratio (ref 0.0–4.4)
Cholesterol, Total: 107 mg/dL (ref 100–199)
HDL: 47 mg/dL (ref >39)
LDL Chol Calc (NIH): 45 mg/dL (ref 0–99)
Triglycerides: 75 mg/dL (ref 0–149)
VLDL Cholesterol Cal: 15 mg/dL (ref 5–40)

## 2022-09-23 LAB — BASIC METABOLIC PANEL
BUN/Creatinine Ratio: 15 (ref 9–23)
BUN: 12 mg/dL (ref 6–20)
CO2: 24 mmol/L (ref 20–29)
Calcium: 9.9 mg/dL (ref 8.7–10.2)
Chloride: 102 mmol/L (ref 96–106)
Creatinine, Ser: 0.82 mg/dL (ref 0.57–1.00)
Glucose: 62 mg/dL — ABNORMAL LOW (ref 70–99)
Potassium: 4 mmol/L (ref 3.5–5.2)
Sodium: 141 mmol/L (ref 134–144)
eGFR: 101 mL/min/{1.73_m2} (ref >59)

## 2022-10-05 ENCOUNTER — Other Ambulatory Visit: Payer: Self-pay

## 2022-10-07 ENCOUNTER — Encounter: Payer: Self-pay | Admitting: Family Medicine

## 2022-10-07 ENCOUNTER — Other Ambulatory Visit: Payer: Self-pay | Admitting: Family Medicine

## 2022-10-07 DIAGNOSIS — F322 Major depressive disorder, single episode, severe without psychotic features: Secondary | ICD-10-CM

## 2022-10-09 ENCOUNTER — Encounter (HOSPITAL_COMMUNITY): Payer: Self-pay

## 2022-10-11 ENCOUNTER — Other Ambulatory Visit (HOSPITAL_COMMUNITY): Payer: Self-pay

## 2022-10-11 ENCOUNTER — Other Ambulatory Visit: Payer: Self-pay

## 2022-10-12 ENCOUNTER — Other Ambulatory Visit (HOSPITAL_COMMUNITY): Payer: Self-pay

## 2022-10-20 ENCOUNTER — Ambulatory Visit: Payer: 59 | Admitting: Family Medicine

## 2022-10-21 ENCOUNTER — Encounter: Payer: Self-pay | Admitting: Family Medicine

## 2022-10-21 ENCOUNTER — Ambulatory Visit (INDEPENDENT_AMBULATORY_CARE_PROVIDER_SITE_OTHER): Payer: 59 | Admitting: Family Medicine

## 2022-10-21 ENCOUNTER — Other Ambulatory Visit (HOSPITAL_COMMUNITY): Payer: Self-pay

## 2022-10-21 VITALS — BP 108/78 | HR 66 | Ht 67.5 in | Wt 117.2 lb

## 2022-10-21 DIAGNOSIS — R112 Nausea with vomiting, unspecified: Secondary | ICD-10-CM | POA: Diagnosis not present

## 2022-10-21 DIAGNOSIS — Z681 Body mass index (BMI) 19 or less, adult: Secondary | ICD-10-CM | POA: Diagnosis not present

## 2022-10-21 DIAGNOSIS — F411 Generalized anxiety disorder: Secondary | ICD-10-CM | POA: Diagnosis not present

## 2022-10-21 DIAGNOSIS — R002 Palpitations: Secondary | ICD-10-CM | POA: Diagnosis not present

## 2022-10-21 MED ORDER — HYDROXYZINE HCL 10 MG PO TABS
10.0000 mg | ORAL_TABLET | Freq: Three times a day (TID) | ORAL | 3 refills | Status: DC | PRN
Start: 2022-10-21 — End: 2022-10-21

## 2022-10-21 MED ORDER — ONDANSETRON 4 MG PO TBDP
4.0000 mg | ORAL_TABLET | Freq: Three times a day (TID) | ORAL | 2 refills | Status: DC | PRN
  Filled 2022-10-21 – 2022-11-23 (×2): qty 20, 7d supply, fill #0
  Filled 2022-12-01 – 2022-12-13 (×3): qty 20, 7d supply, fill #1
  Filled 2023-01-20: qty 20, 7d supply, fill #2

## 2022-10-21 MED ORDER — BISOPROLOL FUMARATE 5 MG PO TABS
5.0000 mg | ORAL_TABLET | Freq: Every day | ORAL | 2 refills | Status: DC
Start: 2022-10-21 — End: 2022-11-01
  Filled 2022-10-21: qty 90, 90d supply, fill #0

## 2022-10-21 MED ORDER — HYDROXYZINE HCL 10 MG PO TABS
10.0000 mg | ORAL_TABLET | Freq: Three times a day (TID) | ORAL | 3 refills | Status: AC | PRN
Start: 2022-10-21 — End: ?
  Filled 2022-10-21 – 2022-12-01 (×4): qty 30, 10d supply, fill #0
  Filled 2022-12-30 – 2023-04-12 (×2): qty 30, 10d supply, fill #1
  Filled 2023-09-06 – 2023-10-10 (×2): qty 30, 10d supply, fill #2

## 2022-10-21 MED ORDER — BISOPROLOL FUMARATE 5 MG PO TABS
5.0000 mg | ORAL_TABLET | Freq: Every day | ORAL | 2 refills | Status: DC
Start: 2022-10-21 — End: 2022-10-21

## 2022-10-21 NOTE — Progress Notes (Signed)
Established patient visit   Patient: Evelyn Goodwin   DOB: 1995/11/29   27 y.o. Female  MRN: [id number] Visit Date: 10/21/2022  Today's healthcare provider: Charlton Amor, DO   Chief Complaint  Patient presents with   Weight Check    SUBJECTIVE    Chief Complaint  Patient presents with   Weight Check   HPI   Pt is here for weight check. Weight at last visit was 117. We discussed eating higher protein meals and consuming more snacks throughout the day. 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   Weight today is 117.04. she denies any diarrhea symptoms/hot flashes/night sweats.  Review of Systems  Constitutional:  Negative for activity change, fatigue and fever.  Respiratory:  Negative for cough and shortness of breath.   Cardiovascular:  Negative for chest pain.  Gastrointestinal:  Negative for abdominal pain.  Genitourinary:  Negative for difficulty urinating.       Current Meds  Medication Sig   ALPRAZolam (XANAX) 0.25 MG tablet Take 0.25 mg by mouth 3 (three) times daily as needed.   buPROPion (WELLBUTRIN XL) 300 MG 24 hr tablet Take 1 tablet (300 mg total) by mouth daily.   cetirizine (ZYRTEC) 10 MG tablet Take by mouth.   dicyclomine (BENTYL) 20 MG tablet Take 1 tablet (20 mg total) by mouth in the morning, at noon, in the evening, and at bedtime. (Patient taking differently: Take 20 mg by mouth as needed for spasms.)   fluticasone (FLONASE) 50 MCG/ACT nasal spray Place 1 spray into both nostrils as needed.   risankizumab-rzaa (SKYRIZI PEN) 150 MG/ML pen Inject 1 mL (150 mg total) into the skin as directed. Every 12 weeks for maintenance.   triamcinolone cream (KENALOG) 0.5 % Apply 1 Application topically 2 (two) times daily. To affected areas.   [DISCONTINUED] bisoprolol (ZEBETA) 5 MG tablet Take 0.5 tablets (2.5 mg total) by mouth daily.   [DISCONTINUED] hydrOXYzine (ATARAX) 10 MG tablet Take 1 tablet (10 mg total) by mouth  3 (three) times daily as needed for itching.   [DISCONTINUED] ondansetron (ZOFRAN-ODT) 4 MG disintegrating tablet Take 1 tablet (4 mg total) by mouth every 8 (eight) hours as needed for nausea or vomiting.    OBJECTIVE    BP 108/78 (BP Location: Left Arm, Patient Position: Sitting, Cuff Size: Normal)   Pulse 66   Ht 5' 7.5" (1.715 m)   Wt 117 lb 4 oz (53.2 kg)   SpO2 99%   BMI 18.09 kg/m   Physical Exam Vitals and nursing note reviewed.  Constitutional:      General: She is not in acute distress.    Appearance: Normal appearance.  HENT:     Head: Normocephalic and atraumatic.     Right Ear: External ear normal.     Left Ear: External ear normal.     Nose: Nose normal.  Eyes:     Conjunctiva/sclera: Conjunctivae normal.  Cardiovascular:     Rate and Rhythm: Normal rate and regular rhythm.  Pulmonary:     Effort: Pulmonary effort is normal.     Breath sounds: Normal breath sounds.  Neurological:     General: No focal deficit present.     Mental Status: She is alert and oriented to person, place, and time.  Psychiatric:        Mood and Affect: Mood normal.        Behavior: Behavior normal.  Thought Content: Thought content normal.        Judgment: Judgment normal.        ASSESSMENT & PLAN    Problem List Items Addressed This Visit       Digestive   Nausea and vomiting   Relevant Medications   ondansetron (ZOFRAN-ODT) 4 MG disintegrating tablet     Other   BMI less than 19,adult    - pt has been trying to eat more protein and is doing well - ref to nutritionist sent in for further eval to make sure she is taking in enough calories and protein in diet. Pt does not want to calorie count and I don't want her doing that either, wanting to make sure she is eating a good, healthy diet       Relevant Orders   Amb ref to Medical Nutrition Therapy-MNT   GAD (generalized anxiety disorder) - Primary   Relevant Medications   hydrOXYzine (ATARAX) 10 MG tablet    Other Visit Diagnoses     Palpitations       Relevant Medications   bisoprolol (ZEBETA) 5 MG tablet       Return in about 6 months (around 04/21/2023) for check in on weight and refills nausea meds.      Meds ordered this encounter  Medications   DISCONTD: bisoprolol (ZEBETA) 5 MG tablet    Sig: Take 1 tablet (5 mg total) by mouth at bedtime.    Dispense:  90 tablet    Refill:  2   DISCONTD: hydrOXYzine (ATARAX) 10 MG tablet    Sig: Take 1 tablet (10 mg total) by mouth 3 (three) times daily as needed for itching.    Dispense:  30 tablet    Refill:  3   bisoprolol (ZEBETA) 5 MG tablet    Sig: Take 1 tablet (5 mg total) by mouth at bedtime.    Dispense:  90 tablet    Refill:  2   hydrOXYzine (ATARAX) 10 MG tablet    Sig: Take 1 tablet (10 mg total) by mouth 3 (three) times daily as needed for itching.    Dispense:  30 tablet    Refill:  3   ondansetron (ZOFRAN-ODT) 4 MG disintegrating tablet    Sig: Take 1 tablet (4 mg total) by mouth every 8 (eight) hours as needed for nausea or vomiting.    Dispense:  20 tablet    Refill:  2    Orders Placed This Encounter  Procedures   Amb ref to Medical Nutrition Therapy-MNT    Referral Priority:   Routine    Referral Type:   Consultation    Referral Reason:   Specialty Services Required    Requested Specialty:   Nutrition    Number of Visits Requested:   1     Charlton Amor, DO  [facility] at [facility] [phone] (phone) [phone] (fax)  [facility]

## 2022-10-21 NOTE — Assessment & Plan Note (Signed)
-   pt has been trying to eat more protein and is doing well - ref to nutritionist sent in for further eval to make sure she is taking in enough calories and protein in diet. Pt does not want to calorie count and I don't want her doing that either, wanting to make sure she is eating a good, healthy diet

## 2022-10-22 ENCOUNTER — Other Ambulatory Visit (HOSPITAL_COMMUNITY): Payer: Self-pay

## 2022-10-26 ENCOUNTER — Other Ambulatory Visit: Payer: Self-pay | Admitting: Family Medicine

## 2022-10-26 DIAGNOSIS — Z681 Body mass index (BMI) 19 or less, adult: Secondary | ICD-10-CM

## 2022-10-29 ENCOUNTER — Encounter: Payer: Self-pay | Admitting: Cardiology

## 2022-10-29 DIAGNOSIS — R002 Palpitations: Secondary | ICD-10-CM

## 2022-11-01 ENCOUNTER — Other Ambulatory Visit: Payer: Self-pay

## 2022-11-01 ENCOUNTER — Other Ambulatory Visit (HOSPITAL_COMMUNITY): Payer: Self-pay

## 2022-11-01 MED ORDER — BISOPROLOL FUMARATE 5 MG PO TABS
5.0000 mg | ORAL_TABLET | Freq: Every day | ORAL | 3 refills | Status: DC
  Filled 2022-11-01: qty 135, 90d supply, fill #0
  Filled 2022-12-01: qty 45, 30d supply, fill #0
  Filled 2023-02-18 – 2023-04-12 (×3): qty 45, 30d supply, fill #1
  Filled 2023-05-12 – 2023-10-21 (×4): qty 45, 30d supply, fill #2

## 2022-11-01 NOTE — Telephone Encounter (Signed)
Spoke with pt, Aware of dr Ludwig Clarks recommendations.

## 2022-11-05 ENCOUNTER — Encounter: Payer: Self-pay | Admitting: Family Medicine

## 2022-11-08 ENCOUNTER — Other Ambulatory Visit (HOSPITAL_COMMUNITY): Payer: Self-pay

## 2022-11-08 DIAGNOSIS — F332 Major depressive disorder, recurrent severe without psychotic features: Secondary | ICD-10-CM | POA: Diagnosis not present

## 2022-11-08 DIAGNOSIS — F4312 Post-traumatic stress disorder, chronic: Secondary | ICD-10-CM | POA: Diagnosis not present

## 2022-11-08 DIAGNOSIS — F411 Generalized anxiety disorder: Secondary | ICD-10-CM | POA: Diagnosis not present

## 2022-11-08 MED ORDER — QUETIAPINE FUMARATE ER 50 MG PO TB24
50.0000 mg | ORAL_TABLET | Freq: Every day | ORAL | 0 refills | Status: DC
  Filled 2022-11-08: qty 30, 30d supply, fill #0

## 2022-11-10 ENCOUNTER — Telehealth: Payer: Self-pay | Admitting: Family Medicine

## 2022-11-10 NOTE — Telephone Encounter (Signed)
Patient called she is requesting a record of her flu shot be uploaded to my chart for work

## 2022-11-12 ENCOUNTER — Other Ambulatory Visit (HOSPITAL_COMMUNITY): Payer: Self-pay

## 2022-11-16 NOTE — Telephone Encounter (Signed)
Pt came in yesterday to pick up. Roselyn Reef, CMA

## 2022-11-19 ENCOUNTER — Ambulatory Visit: Payer: 59 | Admitting: Gastroenterology

## 2022-11-23 ENCOUNTER — Other Ambulatory Visit: Payer: Self-pay

## 2022-11-23 ENCOUNTER — Other Ambulatory Visit (HOSPITAL_COMMUNITY): Payer: Self-pay

## 2022-12-01 ENCOUNTER — Other Ambulatory Visit: Payer: Self-pay

## 2022-12-01 ENCOUNTER — Other Ambulatory Visit (HOSPITAL_COMMUNITY): Payer: Self-pay

## 2022-12-08 ENCOUNTER — Ambulatory Visit: Payer: 59 | Admitting: Registered"

## 2022-12-10 ENCOUNTER — Other Ambulatory Visit (HOSPITAL_COMMUNITY): Payer: Self-pay

## 2022-12-10 ENCOUNTER — Other Ambulatory Visit: Payer: Self-pay | Admitting: Internal Medicine

## 2022-12-13 ENCOUNTER — Other Ambulatory Visit: Payer: Self-pay | Admitting: Pharmacist

## 2022-12-13 ENCOUNTER — Other Ambulatory Visit (HOSPITAL_COMMUNITY): Payer: Self-pay

## 2022-12-13 ENCOUNTER — Other Ambulatory Visit: Payer: Self-pay

## 2022-12-13 ENCOUNTER — Other Ambulatory Visit: Payer: Self-pay | Admitting: Dermatology

## 2022-12-13 MED ORDER — SKYRIZI PEN 150 MG/ML ~~LOC~~ SOAJ
150.0000 mg | SUBCUTANEOUS | 1 refills | Status: DC
  Filled 2022-12-13 – 2022-12-23 (×2): qty 1, 84d supply, fill #0
  Filled 2023-02-28 – 2023-03-03 (×2): qty 1, 84d supply, fill #1

## 2022-12-13 MED ORDER — SKYRIZI PEN 150 MG/ML ~~LOC~~ SOAJ
150.0000 mg | SUBCUTANEOUS | 1 refills | Status: DC
  Filled 2022-12-13: qty 1, 84d supply, fill #0

## 2022-12-14 ENCOUNTER — Other Ambulatory Visit (HOSPITAL_COMMUNITY): Payer: Self-pay

## 2022-12-14 ENCOUNTER — Other Ambulatory Visit: Payer: Self-pay

## 2022-12-15 ENCOUNTER — Other Ambulatory Visit: Payer: Self-pay

## 2022-12-18 ENCOUNTER — Other Ambulatory Visit (HOSPITAL_COMMUNITY): Payer: Self-pay

## 2022-12-23 ENCOUNTER — Other Ambulatory Visit (HOSPITAL_COMMUNITY): Payer: Self-pay | Admitting: Pharmacy Technician

## 2022-12-23 ENCOUNTER — Other Ambulatory Visit (HOSPITAL_COMMUNITY): Payer: Self-pay

## 2022-12-23 NOTE — Progress Notes (Signed)
Specialty Pharmacy Refill Coordination Note  Evelyn Goodwin is a 27 y.o. female contacted today regarding refills of specialty medication(s) Risankizumab-Rzaa (Antipsoriatics)   Patient requested Daryll Drown at [facility] at [place] date: 12/30/22   Medication will be filled on 12/29/22.

## 2022-12-24 ENCOUNTER — Other Ambulatory Visit: Payer: Self-pay

## 2022-12-29 ENCOUNTER — Other Ambulatory Visit: Payer: Self-pay

## 2022-12-30 ENCOUNTER — Ambulatory Visit: Payer: 59 | Admitting: Nurse Practitioner

## 2022-12-30 ENCOUNTER — Encounter: Payer: Self-pay | Admitting: Nurse Practitioner

## 2022-12-30 ENCOUNTER — Other Ambulatory Visit (HOSPITAL_COMMUNITY): Payer: Self-pay

## 2022-12-30 VITALS — BP 118/78 | HR 71 | Ht 67.5 in | Wt 123.0 lb

## 2022-12-30 DIAGNOSIS — G8929 Other chronic pain: Secondary | ICD-10-CM

## 2022-12-30 DIAGNOSIS — Z8619 Personal history of other infectious and parasitic diseases: Secondary | ICD-10-CM | POA: Diagnosis not present

## 2022-12-30 DIAGNOSIS — R1031 Right lower quadrant pain: Secondary | ICD-10-CM

## 2022-12-30 MED ORDER — SUTAB 1479-225-188 MG PO TABS
24.0000 | ORAL_TABLET | Freq: Once | ORAL | 0 refills | Status: AC
  Filled 2022-12-30 – 2023-02-22 (×3): qty 24, 1d supply, fill #0

## 2022-12-30 NOTE — Progress Notes (Signed)
Agree with assessment and plan as outlined - will await colonoscopy.

## 2022-12-30 NOTE — Patient Instructions (Signed)
You have been scheduled for a colonoscopy. Please follow written instructions given to you at your visit today.   Please pick up your prep supplies at the pharmacy within the next 1-3 days.  If you use inhalers (even only as needed), please bring them with you on the day of your procedure.  DO NOT TAKE 7 DAYS PRIOR TO TEST- Trulicity (dulaglutide) Ozempic, Wegovy (semaglutide) Mounjaro (tirzepatide) Bydureon Bcise (exanatide extended release)  DO NOT TAKE 1 DAY PRIOR TO YOUR TEST Rybelsus (semaglutide) Adlyxin (lixisenatide) Victoza (liraglutide) Byetta (exanatide) ___________________________________________________________________  _______________________________________________________  If your blood pressure at your visit was 140/90 or greater, please contact your primary care physician to follow up on this.  _______________________________________________________  If you are age 64 or older, your body mass index should be between 23-30. Your Body mass index is 18.98 kg/m. If this is out of the aforementioned range listed, please consider follow up with your Primary Care Provider.  If you are age 84 or younger, your body mass index should be between 19-25. Your Body mass index is 18.98 kg/m. If this is out of the aformentioned range listed, please consider follow up with your Primary Care Provider.   ________________________________________________________  The [place] [facility] providers would like to encourage you to use [facility] to communicate with providers for non-urgent requests or questions.  Due to long hold times on the telephone, sending your provider a message by [facility] may be a faster and more efficient way to get a response.  Please allow 48 business hours for a response.  Please remember that this is for non-urgent requests.  _______________________________________________________

## 2022-12-30 NOTE — Progress Notes (Signed)
ASSESSMENT    Brief Narrative:  27 y.o.  female known to Dr. Orvan Falconer  with a past medical history not limited to non H. 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   Postop course was complicated by C-diff ( twice) with diffuse colitis on CT scan.  Subsequent colonoscopy Sept 2021 showed ulcerated mucosa throughout the colon with biopsies suggestive of acute colitis. Despite persistent symptoms, repeat CT scan February 2024 was unremarkable . For persistent symptoms we scheduled repeat colonoscopy to rule out IBD but patient had to cancel it. No longer having loose stool but the RLQ pain persists. Though defecation can  help the pain, interestingly the most relief comes after a dose of Skyrizi which she takes for psoriatic arthritis   HPI   Chief complaint : Lower abdominal pain   Brief GI History:  Patient underwent appendectomy in April 2021 for ruptured appendicitis. Subsequently developed diarrhea and had ongoing RLQ pain. CT scan showed complete resolution of RLQ inflammatory process but suggestion of colitis involving sigmoid colon and rectum. C-diff was negative. A repeat CT scan in May showed diffused colitis. Repeat C-Diff was positive. Treated with oral Vancomycin with  improvement. Had recurrent abdominal pain and diarrhea late June 2021 and C-diff was positive so retreated with Vancomycin but developed rash and changed to Dificid. Symptoms resolved.  Colonoscopy was done to rule out IBD and showed ulcerated mucosa in entire colon.  Biopsy showed mildly active colitis.  No diagnostic findings of IBD. Terminal ileum biopsies were normal. In 2023 she was seen for weight loss, upper abdominal pain.  EGD showed  gastritis. She has previously been treated with PPI , and dicyclomine ( doesn't help) and Xifaxan.  She stopped the Xifaxan for couple days due to lack of improvement.  She was last seen here by Dr. Orvan Falconer February 2024 evaluation RLQ pain and nausea. CRP was normal.  Repeat CT AP done and negative. Plan was for colonoscopy which was canceled by patient.   Interval History:  Evelyn Goodwin to have lower abdominal pain, predominantly RLQ in the area of where the appendix was.  Pain is not always related to eating.  Bowel movements are normal and having one can help the pain.  Zofran also helps the pain .  Interestingly she feels better after a dose of Skyrizi.  She has been intentionally gaining weight   GI History / Pertinent GI Studies   **All endoscopic studies may not be included here    Sept 2021 Colonoscopy  -- Ulcerated mucosa in the entire colon Surgical [P], small bowel, terminal ileum - ILEAL MUCOSA WITH NO SPECIFIC HISTOPATHOLOGIC CHANGES - NEGATIVE FOR ACUTE INFLAMMATION, FEATURES OF CHRONICITY OR GRANULOMAS 2. Surgical [P], right colon - MILDLY ACTIVE NONSPECIFIC COLITIS - NEGATIVE FOR GRANULOMAS OR FEATURES OF CHRONICITY 3. Surgical [P], transverse colon - FOCAL ACTIVE COLITIS - NEGATIVE FOR GRANULOMAS OR FEATURES OF CHRONICITY 4. Surgical [P], left colon - MILDLY ACTIVE NONSPECIFIC COLITIS - NEGATIVE FOR GRANULOMAS OR FEATURES OF CHRONICITY 5. Surgical [P], colon, rectum - MILDLY ACTIVE NONSPECIFIC PROCTITIS - NEGATIVE FOR GRANULOMAS OR FEATURES OF CHRONICITY     Latest Ref Rng & Units 07/22/2019    7:45 PM 05/30/2019    8:58 PM 05/05/2019  9:51 PM  Hepatic Function  Total Protein 6.5 - 8.1 g/dL 8.3  7.7  8.0   Albumin 3.5 - 5.0 g/dL 3.2  3.7  3.8   AST 15 - 41 U/L 18  18  45   ALT 0 - 44 U/L 12  22  56   Alk Phosphatase 38 - 126 U/L 68  65  63   Total Bilirubin 0.3 - 1.2 mg/dL 0.3  0.1  0.5   Bilirubin, Direct 0.0 - 0.2 mg/dL <2.1          Latest Ref Rng &  Units 09/22/2022   10:18 AM 07/22/2019    7:45 PM 05/30/2019    8:58 PM  CBC  WBC 3.4 - 10.8 x10E3/uL 8.0  17.6  9.6   Hemoglobin 11.1 - 15.9 g/dL 30.8  65.7  84.6   Hematocrit 34.0 - 46.6 % 43.6  40.2  40.6   Platelets 150 - 450 x10E3/uL 399  563  403      Past Medical History:  Diagnosis Date   Anxiety    Clostridium difficile infection    Depression    Psoriasis    Scoliosis     Past Surgical History:  Procedure Laterality Date   LAPAROSCOPIC APPENDECTOMY N/A 05/07/2019   Procedure: APPENDECTOMY LAPAROSCOPIC;  Surgeon: Violeta Gelinas, MD;  Location: [facility] OR;  Service: General;  Laterality: N/A;   MOUTH SURGERY     27 year old molers removed   WISDOM TOOTH EXTRACTION  2014    Family History  Problem Relation Age of Onset   Psoriasis Mother    Healthy Brother    Hypertension Maternal Grandmother    Hypertension Maternal Grandfather    Hypertension Paternal Grandmother    Hypertension Paternal Grandfather    Hypertension Other    Heart attack Other    Breast cancer Other    Colon cancer Neg Hx    Esophageal cancer Neg Hx    Pancreatic cancer Neg Hx    Stomach cancer Neg Hx    Rectal cancer Neg Hx     Current Medications, Allergies, Family History and Social History were reviewed in [employer] record.     Current Outpatient Medications  Medication Sig Dispense Refill   ALPRAZolam (XANAX) 0.25 MG tablet Take 0.25 mg by mouth 3 (three) times daily as needed.     bisoprolol (ZEBETA) 5 MG tablet Take 1 tablet (5 mg total) by mouth at bedtime. May take extra 1/2 tablet as needed for palpitations 135 tablet 3   buPROPion (WELLBUTRIN XL) 300 MG 24 hr tablet Take 1 tablet (300 mg total) by mouth daily. 30 tablet 1   cetirizine (ZYRTEC) 10 MG tablet Take by mouth.     fluticasone (FLONASE) 50 MCG/ACT nasal spray Place 1 spray into both nostrils as needed.     hydrOXYzine (ATARAX) 10 MG tablet Take 1 tablet (10 mg total) by mouth 3 (three) times daily  as needed for itching. 30 tablet 3   ondansetron (ZOFRAN-ODT) 4 MG disintegrating tablet Dissolve 1 tablet (4 mg total) by mouth every 8 (eight) hours as needed for nausea or vomiting. 20 tablet 2   QUEtiapine (SEROQUEL XR) 50 MG TB24 24 hr tablet take one tablet by mouth daily 30 tablet 0   risankizumab-rzaa (SKYRIZI PEN) 150 MG/ML pen Inject 150 mg into the skin as directed. Every 12 weeks for maintenance. 1 mL 1   No current facility-administered medications for this visit.    Review  of Systems: No chest pain. No shortness of breath. No urinary complaints.    Physical Exam  Filed Weights   12/30/22 1101  Weight: 123 lb (55.8 kg)   Wt Readings from Last 3 Encounters:  12/30/22 123 lb (55.8 kg)  10/21/22 117 lb 4 oz (53.2 kg)  09/22/22 117 lb 4 oz (53.2 kg)    BP 118/78 (BP Location: Left Arm, Patient Position: Sitting, Cuff Size: Normal)   Pulse 71   Ht 5' 7.5" (1.715 m)   Wt 123 lb (55.8 kg)   SpO2 98%   BMI 18.98 kg/m  Constitutional:  Pleasant, generally well appearing female in no acute distress. Psychiatric: Normal mood and affect. Behavior is normal. EENT: Pupils normal.  Conjunctivae are normal. No scleral icterus. Neck supple.  Cardiovascular: Normal rate, regular rhythm.  Pulmonary/chest: Effort normal and breath sounds normal. No wheezing, rales or rhonchi. Abdominal: Soft, nondistended, nontender. Bowel sounds active throughout. There are no masses palpable. No hepatomegaly. Neurological: Alert and oriented to person place and time.    Willette Cluster, NP  12/30/2022, 11:30 AM  Cc:  Charlton Amor, DO

## 2023-01-03 ENCOUNTER — Telehealth: Payer: Self-pay | Admitting: *Deleted

## 2023-01-03 NOTE — Telephone Encounter (Signed)
-----   Message from Benancio Deeds sent at 12/31/2022  3:58 PM EST ----- Her exam is diagnostic (not screening) due to symptoms of abdominal pain and diarrhea. Hope that helps.  Dr. Mervyn Skeeters ----- Message ----- From: Avanell Shackleton, RN Sent: 12/31/2022   1:30 PM EST To: Benancio Deeds, MD  Patient has had a colonoscopy in 9/21,5/24 and will have another colonoscopy in 03/04/2023. I believe due to her diagnosis of acute colitis. Patient wants to know if the colonoscopy is considered diagnostic or screening? I don't want to assume, we know what that does. Please advise so I can contact the patient and inform. I believe its for her insurance company, so that it is coded correctly.  Thank you

## 2023-01-03 NOTE — Telephone Encounter (Signed)
Called patient to inform her the colonoscopy is considered diagnostic and not screening due to the abdominal pain and diarrhea per Dr. Adela Lank. Also informed the patient her insurance company will code it according to its diagnostic status. Patient agreed and understood.

## 2023-01-04 ENCOUNTER — Other Ambulatory Visit: Payer: Self-pay | Admitting: Family Medicine

## 2023-01-04 ENCOUNTER — Other Ambulatory Visit (HOSPITAL_COMMUNITY): Payer: Self-pay

## 2023-01-04 MED ORDER — BUPROPION HCL ER (XL) 300 MG PO TB24
300.0000 mg | ORAL_TABLET | Freq: Every day | ORAL | 1 refills | Status: DC
  Filled 2023-01-04 – 2023-01-20 (×2): qty 30, 30d supply, fill #0
  Filled 2023-05-12 – 2023-07-04 (×2): qty 30, 30d supply, fill #1

## 2023-01-13 ENCOUNTER — Other Ambulatory Visit (HOSPITAL_COMMUNITY): Payer: Self-pay

## 2023-01-14 ENCOUNTER — Other Ambulatory Visit (HOSPITAL_COMMUNITY): Payer: Self-pay

## 2023-01-20 ENCOUNTER — Other Ambulatory Visit (HOSPITAL_COMMUNITY): Payer: Self-pay

## 2023-01-24 ENCOUNTER — Other Ambulatory Visit: Payer: Self-pay

## 2023-01-25 ENCOUNTER — Other Ambulatory Visit (HOSPITAL_COMMUNITY): Payer: Self-pay

## 2023-01-26 ENCOUNTER — Other Ambulatory Visit: Payer: Self-pay

## 2023-02-03 ENCOUNTER — Ambulatory Visit: Payer: 59 | Admitting: Dermatology

## 2023-02-09 ENCOUNTER — Encounter: Payer: Self-pay | Admitting: Family Medicine

## 2023-02-09 ENCOUNTER — Ambulatory Visit: Payer: 59 | Admitting: Family Medicine

## 2023-02-09 ENCOUNTER — Ambulatory Visit (INDEPENDENT_AMBULATORY_CARE_PROVIDER_SITE_OTHER): Payer: 59 | Admitting: Family Medicine

## 2023-02-09 ENCOUNTER — Other Ambulatory Visit (HOSPITAL_COMMUNITY): Payer: Self-pay

## 2023-02-09 VITALS — BP 139/86 | HR 117 | Ht 67.5 in | Wt 121.8 lb

## 2023-02-09 DIAGNOSIS — J029 Acute pharyngitis, unspecified: Secondary | ICD-10-CM | POA: Insufficient documentation

## 2023-02-09 DIAGNOSIS — J069 Acute upper respiratory infection, unspecified: Secondary | ICD-10-CM | POA: Diagnosis not present

## 2023-02-09 DIAGNOSIS — R6889 Other general symptoms and signs: Secondary | ICD-10-CM | POA: Insufficient documentation

## 2023-02-09 DIAGNOSIS — Z7251 High risk heterosexual behavior: Secondary | ICD-10-CM | POA: Diagnosis not present

## 2023-02-09 DIAGNOSIS — N3001 Acute cystitis with hematuria: Secondary | ICD-10-CM | POA: Insufficient documentation

## 2023-02-09 DIAGNOSIS — R8290 Unspecified abnormal findings in urine: Secondary | ICD-10-CM

## 2023-02-09 LAB — POCT URINALYSIS DIP (CLINITEK)
Bilirubin, UA: NEGATIVE
Glucose, UA: NEGATIVE mg/dL
Ketones, POC UA: NEGATIVE mg/dL
Nitrite, UA: NEGATIVE
POC PROTEIN,UA: NEGATIVE
Spec Grav, UA: 1.025 (ref 1.010–1.025)
Urobilinogen, UA: 0.2 U/dL
pH, UA: 5.5 (ref 5.0–8.0)

## 2023-02-09 LAB — POCT INFLUENZA A/B
Influenza A, POC: NEGATIVE
Influenza B, POC: NEGATIVE

## 2023-02-09 LAB — POCT RAPID STREP A (OFFICE): Rapid Strep A Screen: NEGATIVE

## 2023-02-09 LAB — POCT URINE PREGNANCY: Preg Test, Ur: NEGATIVE

## 2023-02-09 LAB — POC COVID19 BINAXNOW: SARS Coronavirus 2 Ag: NEGATIVE

## 2023-02-09 MED ORDER — ACETAMINOPHEN 500 MG PO TABS
500.0000 mg | ORAL_TABLET | Freq: Four times a day (QID) | ORAL | 0 refills | Status: AC | PRN
  Filled 2023-02-09: qty 30, 8d supply, fill #0

## 2023-02-09 MED ORDER — GUAIFENESIN ER 600 MG PO TB12
600.0000 mg | ORAL_TABLET | Freq: Two times a day (BID) | ORAL | 0 refills | Status: AC
  Filled 2023-02-09: qty 20, 10d supply, fill #0

## 2023-02-09 MED ORDER — METHYLPREDNISOLONE 4 MG PO TBPK
ORAL_TABLET | ORAL | 0 refills | Status: DC
  Filled 2023-02-09: qty 21, 6d supply, fill #0

## 2023-02-09 MED ORDER — HYDROCOD POLI-CHLORPHE POLI ER 10-8 MG/5ML PO SUER
5.0000 mL | Freq: Two times a day (BID) | ORAL | 0 refills | Status: DC | PRN
  Filled 2023-02-09: qty 120, 12d supply, fill #0

## 2023-02-09 MED ORDER — IBUPROFEN 800 MG PO TABS
800.0000 mg | ORAL_TABLET | Freq: Three times a day (TID) | ORAL | 0 refills | Status: DC | PRN
  Filled 2023-02-09: qty 30, 10d supply, fill #0

## 2023-02-09 MED ORDER — ALBUTEROL SULFATE HFA 108 (90 BASE) MCG/ACT IN AERS
2.0000 | INHALATION_SPRAY | Freq: Four times a day (QID) | RESPIRATORY_TRACT | 0 refills | Status: AC | PRN
  Filled 2023-02-09: qty 6.7, 30d supply, fill #0

## 2023-02-09 NOTE — Progress Notes (Signed)
Established patient visit   Patient: Evelyn Goodwin   DOB: [birth year]   28 y.o. Female  MRN: [id number] Visit Date: 02/09/2023  Today's healthcare provider: Charlton Amor, DO   Chief Complaint  Patient presents with   Cough    Sore throat, congestion x1day    SUBJECTIVE    Chief Complaint  Patient presents with   Cough    Sore throat, congestion x1day   HPI HPI     Cough    Additional comments: Sore throat, congestion x1day      Last edited by Roselyn Reef, CMA on 02/09/2023  9:17 AM.      Pt presents with one day of not feeling well. Admits to coughing and a sore throat. She was around an infant that tested positive for RSV.   Review of Systems  Constitutional:  Negative for activity change, fatigue and fever.  HENT:  Positive for congestion and sore throat.   Respiratory:  Positive for cough. Negative for shortness of breath.   Cardiovascular:  Negative for chest pain.  Gastrointestinal:  Negative for abdominal pain.  Genitourinary:  Negative for difficulty urinating.       Current Meds  Medication Sig   acetaminophen (TYLENOL) 500 MG tablet Take 1 tablet (500 mg total) by mouth every 6 (six) hours as needed for fever.   albuterol (VENTOLIN HFA) 108 (90 Base) MCG/ACT inhaler Inhale 2 puffs into the lungs every 6 (six) hours as needed for wheezing or shortness of breath.   ALPRAZolam (XANAX) 0.25 MG tablet Take 0.25 mg by mouth 3 (three) times daily as needed.   bisoprolol (ZEBETA) 5 MG tablet Take 1 tablet (5 mg total) by mouth at bedtime. May take extra 1/2 tablet as needed for palpitations   buPROPion (WELLBUTRIN XL) 300 MG 24 hr tablet Take 1 tablet (300 mg total) by mouth daily.   cetirizine (ZYRTEC) 10 MG tablet Take by mouth.   chlorpheniramine-HYDROcodone (TUSSIONEX) 10-8 MG/5ML Take 5 mLs by mouth every 12 (twelve) hours as needed for cough (cough, will cause drowsiness.).   fluticasone (FLONASE) 50 MCG/ACT nasal spray Place 1 spray into both  nostrils as needed.   guaiFENesin (MUCINEX) 600 MG 12 hr tablet Take 1 tablet (600 mg total) by mouth 2 (two) times daily for 7 days.   hydrOXYzine (ATARAX) 10 MG tablet Take 1 tablet (10 mg total) by mouth 3 (three) times daily as needed for itching.   ibuprofen (ADVIL) 800 MG tablet Take 1 tablet (800 mg total) by mouth every 8 (eight) hours as needed for moderate pain (pain score 4-6) or fever.   methylPREDNISolone (MEDROL DOSEPAK) 4 MG TBPK tablet Follow instructions on pill pack   ondansetron (ZOFRAN-ODT) 4 MG disintegrating tablet Dissolve 1 tablet (4 mg total) by mouth every 8 (eight) hours as needed for nausea or vomiting.   risankizumab-rzaa (SKYRIZI PEN) 150 MG/ML pen Inject 150 mg into the skin as directed. Every 12 weeks for maintenance.   [DISCONTINUED] QUEtiapine (SEROQUEL XR) 50 MG TB24 24 hr tablet take one tablet by mouth daily    OBJECTIVE    BP 139/86 (BP Location: Left Arm, Patient Position: Sitting, Cuff Size: Normal)   Pulse (!) 117   Ht 5' 7.5" (1.715 m)   Wt 121 lb 12 oz (55.2 kg)   SpO2 98%   BMI 18.79 kg/m   Physical Exam Vitals and nursing note reviewed.  Constitutional:      General: She is in acute distress.  Appearance: Normal appearance.  HENT:     Head: Normocephalic and atraumatic.     Right Ear: Tympanic membrane, ear canal and external ear normal.     Left Ear: Tympanic membrane, ear canal and external ear normal.     Ears:     Comments: Some fluid seen on both ears    Nose: Nose normal.     Mouth/Throat:     Pharynx: Posterior oropharyngeal erythema present. No oropharyngeal exudate.  Eyes:     Conjunctiva/sclera: Conjunctivae normal.  Cardiovascular:     Rate and Rhythm: Regular rhythm. Tachycardia present.  Pulmonary:     Effort: Pulmonary effort is normal.     Breath sounds: Normal breath sounds.  Neurological:     General: No focal deficit present.     Mental Status: She is alert and oriented to person, place, and time.   Psychiatric:        Mood and Affect: Mood normal.        Behavior: Behavior normal.        Thought Content: Thought content normal.        Judgment: Judgment normal.        ASSESSMENT & PLAN    Problem List Items Addressed This Visit       Respiratory   Viral pharyngitis   Relevant Medications   acetaminophen (TYLENOL) 500 MG tablet   ibuprofen (ADVIL) 800 MG tablet   Other Relevant Orders   POCT Influenza A/B (Completed)   Viral URI with cough - Primary   With hx of being around infant with rsv do believe this is a viral infection will go ahead and treat with supportive care--have given motrin, tylenol, mucinex and tussionex. Pmp reviewed and verified with no red flags.  - fluid on ears recommend zyrtec  - will also send in albuterol inhaler to help with any type of bronchospasm patient may get - temp was 99 in clinic and pt was tachycardic on exam so I do believe this is related to the virus - recommend she stay out of work for the next few days or see if she can do some sort of work from home or work to where she does not have direct patient contact to prevent spread      Relevant Medications   guaiFENesin (MUCINEX) 600 MG 12 hr tablet   chlorpheniramine-HYDROcodone (TUSSIONEX) 10-8 MG/5ML   methylPREDNISolone (MEDROL DOSEPAK) 4 MG TBPK tablet   acetaminophen (TYLENOL) 500 MG tablet   ibuprofen (ADVIL) 800 MG tablet   Other Relevant Orders   POCT rapid strep A (Completed)   POC COVID-19 (Completed)     Other   Flu-like symptoms   Poc strep negative Poc flu Poc covid negative      Relevant Orders   POCT Influenza A/B (Completed)   POC COVID-19 (Completed)   Unprotected sex   UPT negative Ua did show some leuks but in the setting of no uti like symptoms likely asymptomatic bacteruria which we will not treat.       Relevant Orders   POCT urine pregnancy (Completed)   Other Visit Diagnoses       Cloudy urine       Relevant Orders   POCT URINALYSIS DIP  (CLINITEK) (Completed)   Urine Culture       Return if symptoms worsen or fail to improve.      Meds ordered this encounter  Medications   guaiFENesin (MUCINEX) 600 MG 12 hr tablet  Sig: Take 1 tablet (600 mg total) by mouth 2 (two) times daily for 7 days.    Dispense:  20 tablet    Refill:  0   chlorpheniramine-HYDROcodone (TUSSIONEX) 10-8 MG/5ML    Sig: Take 5 mLs by mouth every 12 (twelve) hours as needed for cough (cough, will cause drowsiness.).    Dispense:  120 mL    Refill:  0   methylPREDNISolone (MEDROL DOSEPAK) 4 MG TBPK tablet    Sig: Follow instructions on pill pack    Dispense:  21 tablet    Refill:  0   acetaminophen (TYLENOL) 500 MG tablet    Sig: Take 1 tablet (500 mg total) by mouth every 6 (six) hours as needed for fever.    Dispense:  30 tablet    Refill:  0   ibuprofen (ADVIL) 800 MG tablet    Sig: Take 1 tablet (800 mg total) by mouth every 8 (eight) hours as needed for moderate pain (pain score 4-6) or fever.    Dispense:  30 tablet    Refill:  0   albuterol (VENTOLIN HFA) 108 (90 Base) MCG/ACT inhaler    Sig: Inhale 2 puffs into the lungs every 6 (six) hours as needed for wheezing or shortness of breath.    Dispense:  8 g    Refill:  0    Orders Placed This Encounter  Procedures   Urine Culture   POCT URINALYSIS DIP (CLINITEK)   POCT urine pregnancy   POCT Influenza A/B   POCT rapid strep A   POC COVID-19    Previously tested for COVID-19:   Yes    Resident in a congregate (group) care setting:   Unknown    Employed in healthcare setting:   Unknown    Pregnant:   Unknown     Charlton Amor, DO  [facility] at [facility] [phone] (phone) [phone] (fax)  [facility]

## 2023-02-09 NOTE — Assessment & Plan Note (Addendum)
UPT negative Ua did show some leuks but in the setting of no uti like symptoms likely asymptomatic bacteruria which we will not treat.

## 2023-02-09 NOTE — Assessment & Plan Note (Addendum)
With hx of being around infant with rsv do believe this is a viral infection will go ahead and treat with supportive care--have given motrin, tylenol, mucinex and tussionex. Pmp reviewed and verified with no red flags.  - fluid on ears recommend zyrtec  - will also send in albuterol inhaler to help with any type of bronchospasm patient may get - temp was 99 in clinic and pt was tachycardic on exam so I do believe this is related to the virus - recommend she stay out of work for the next few days or see if she can do some sort of work from home or work to where she does not have direct patient contact to prevent spread

## 2023-02-09 NOTE — Patient Instructions (Signed)
Increase fluids   Increase vit c, d, and zinc (these are all immune boosters)   Take zyrtec at night for fluid on ears and use flonase nasal spray to help with pressure   The medrol dose pack will help with the sore throat as well as any inflammation in your bronchioles   If you get worsening symptoms let me know

## 2023-02-09 NOTE — Assessment & Plan Note (Addendum)
Poc strep negative Poc flu Poc covid negative

## 2023-02-09 NOTE — Assessment & Plan Note (Signed)
UA positive for leukocytes and blood  Will go ahead and culture as well as treat for UTI since patient has not been feeling well and this could be a cause.

## 2023-02-10 ENCOUNTER — Ambulatory Visit: Payer: 59 | Admitting: Family Medicine

## 2023-02-11 ENCOUNTER — Encounter: Payer: Self-pay | Admitting: Family Medicine

## 2023-02-11 LAB — URINE CULTURE

## 2023-02-13 ENCOUNTER — Other Ambulatory Visit (HOSPITAL_COMMUNITY): Payer: Self-pay

## 2023-02-13 ENCOUNTER — Encounter: Payer: Self-pay | Admitting: Family Medicine

## 2023-02-13 ENCOUNTER — Other Ambulatory Visit: Payer: Self-pay | Admitting: Family Medicine

## 2023-02-13 DIAGNOSIS — J069 Acute upper respiratory infection, unspecified: Secondary | ICD-10-CM

## 2023-02-13 MED ORDER — HYDROCOD POLI-CHLORPHE POLI ER 10-8 MG/5ML PO SUER
5.0000 mL | Freq: Two times a day (BID) | ORAL | 0 refills | Status: DC | PRN
  Filled 2023-02-13 – 2023-02-14 (×2): qty 120, 12d supply, fill #0

## 2023-02-14 ENCOUNTER — Other Ambulatory Visit (HOSPITAL_COMMUNITY): Payer: Self-pay

## 2023-02-15 NOTE — Progress Notes (Addendum)
 Cardiology Clinic Note   Patient Name: Evelyn Goodwin Date of Encounter: 02/24/2023  Primary Care Provider:  Bevin Bernice RAMAN, DO Primary Cardiologist:  Redell Shallow, MD  Patient Profile    Evelyn Goodwin 28 year old female presents to the clinic today for follow-up evaluation of her palpitations.  Past Medical History    Past Medical History:  Diagnosis Date   Anxiety    Clostridium difficile infection    Depression    Psoriasis    Scoliosis    Past Surgical History:  Procedure Laterality Date   LAPAROSCOPIC APPENDECTOMY N/A 05/07/2019   Procedure: APPENDECTOMY LAPAROSCOPIC;  Surgeon: Sebastian Moles, MD;  Location: [facility] OR;  Service: General;  Laterality: N/A;   MOUTH SURGERY     28 year old molers removed   WISDOM TOOTH EXTRACTION  2014    Allergies  Allergies  Allergen Reactions   Diphenhydramine  Hcl Other (See Comments)    Other Reaction(s): Psychosis  Sleep paralysis   Metoprolol  Other (See Comments)    Frequent urination.    Vraylar  [Cariprazine ] Nausea And Vomiting   Cardizem  [Diltiazem ] Palpitations   Penicillin G Rash   Vancomycin  Rash    History of Present Illness    Evelyn Goodwin has a PMH of depression, palpitations, anxiety, and NSVT.  She was seen and evaluated by Dr. Shallow in 2021.  She complained of palpitations.  She wore a cardiac event monitor 3/21 which showed PVCs, and 3 beats of NSVT.  Her echocardiogram 2/21 showed an EF of 60 to 65%, no regional wall motion abnormalities and no valvular abnormalities.  She was seen in the clinic by Damien Braver NP-C on 08/26/2021.  During that time she reported increased palpitations.  She indicated that it felt like it felt like the bottom portion of shaking.  She had never started taking metoprolol .  Her cardiac event monitor was repeated and showed occasional PACs, PVCs, and 3 beats of NSVT.  She indicated that she would mainly notice her palpitations at night.  She felt that the  metoprolol  was not effective in controlling her palpitations and she asked for alternative medication.  She also indicated that she had not started taking her Wellbutrin  because she had been told by her PCP that it could affect her blood pressure and heart rate with taking metoprolol .  She was started on diltiazem .  She contacted nurse triage line on 08/17/2021.  She indicated that she had been taking diltiazem  every night for 1 week.  She would wake up with a quivering sensation.  She was unable to take diltiazem  in the morning because it makes her sleepy.  She was instructed to discontinue the medication and start propranolol  10 mg 4 times daily as needed.  She presented to the clinic 11/27/21 for follow-up evaluation stated she continued to notice brief intermittent episodes of palpitations.  She noticed palpitations at random times.  She noted that with taking diltiazem  she would have shaking type sensation in the morning with teeth chattering.  She discontinued the medication and symptoms stopped.  We reviewed her previous echocardiogram and cardiac event monitors.  She expressed understanding.  She was working at [employer] doing 8-hour shifts.  She indicated that she had not been hydrating well and did not have a formal exercise routine.  We reviewed option for propranolol  and importance of increasing her physical activity as well as maintaining p.o. hydration.  We reviewed her labs from September.   I  planned follow-up in 6  months.  She was seen in follow-up by Dr. Pietro 05/31/2022.  She was started on metoprolol  succinate 25 mg at bedtime.  Her medication was changed to bisoprolol  due to being unable to tolerate metoprolol .  She was seen in follow-up by Lamarr Satterfield, NP on 09/03/2022.  During that time she continued to have palpitations and feeling of heart racing at times.  Her symptoms were associated with anxiety and stress.  She reported recent traumatic episodes which caused her to  have significant depression and anxiety.  She was following with psychotherapy.  Her bisoprolol  was continued.  Her TSH was drawn by her PCP and was noted to be normal.  She was instructed to avoid caffeine, stimulants, and vaping.  She presents to the clinic today for follow-up evaluation and states her bisoprolol  is controlling her palpitations fairly well.  She has not needed to take any extra half doses of the medication.  She does note with periods of anxiety her heart rate will increase.  She also notices occasional episodes of palpitations that last for about 45 seconds usually in the evening and then dissipate without intervention.  She continues to work to avoid triggers and has started working out at [employer]  We reviewed her previous echocardiogram and she expressed understanding.  She reports that she does not have as much stress at this time due to her grandmother being discharged from the hospital and does note that her uncle passed away in 01/10/2024.  She had recent elevated heart rate with norovirus infection and viral URI.  She is recovering well.  She does have a colonoscopy planned for next Friday for evaluation of epigastric type pain.  I will continue her current medication regimen and plan follow-up in 12 months.  Today she denies chest pain, shortness of breath, and lower extremity edema.    Home Medications    Prior to Admission medications   Medication Sig Start Date End Date Taking? Authorizing Provider  ALPRAZolam (XANAX) 0.25 MG tablet Take 0.25 mg by mouth 3 (three) times daily as needed. 03/24/21   [provider]  buPROPion  (WELLBUTRIN  XL) 300 MG 24 hr tablet Take 1 tablet by mouth daily. 10/06/20   [provider]  dicyclomine  (BENTYL ) 20 MG tablet Take 1 tablet (20 mg total) by mouth in the morning, at noon, in the evening, and at bedtime. 08/26/21   Eda Iha, MD  diltiazem  (CARDIZEM  CD) 240 MG 24 hr capsule Take 1 capsule (240 mg total) by mouth  daily. 10/19/21   Daneen Damien BROCKS, NP  diphenhydrAMINE  HCl, Sleep, (ZZZQUIL PO) Take by mouth. Take at night    [provider]  Risankizumab -rzaa (SKYRIZI  PEN) 150 MG/ML SOAJ Inject 150 mg into the skin as directed. Every 12 weeks for maintenance. 04/29/21   Jegede, Olugbemiga E, MD    Family History    Family History  Problem Relation Age of Onset   Psoriasis Mother    Healthy Brother    Hypertension Maternal Grandmother    Hypertension Maternal Grandfather    Hypertension Paternal Grandmother    Hypertension Paternal Grandfather    Hypertension Other    Heart attack Other    Breast cancer Other    Colon cancer Neg Hx    Esophageal cancer Neg Hx    Pancreatic cancer Neg Hx    Stomach cancer Neg Hx    Rectal cancer Neg Hx    She indicated that her mother is alive. She indicated that her father is  alive. She indicated that her brother is alive. She indicated that the status of her maternal grandmother is unknown. She indicated that the status of her maternal grandfather is unknown. She indicated that the status of her paternal grandmother is unknown. She indicated that the status of her paternal grandfather is unknown. She indicated that the status of her neg hx is unknown. She indicated that the status of her other is unknown.  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 [employer]    Occupation: engineer, site  Tobacco Use   Smoking status: Never   Smokeless tobacco: Never  Vaping Use   Vaping status: Some Days   Substances: Nicotine, Flavoring  Substance and Sexual Activity   Alcohol use: Yes    Comment: Rare   Drug use: Not Currently   Sexual activity: Yes    Birth control/protection: Surgical    Comment:  vasectomy -  Other Topics Concern   Not on file  Social History Narrative   Not on file   Social Drivers of Health   Financial Resource Strain: Low Risk  (02/08/2023)   Overall Financial Resource Strain (CARDIA)    Difficulty of Paying Living Expenses: Not hard at all  Food Insecurity: No Food Insecurity (02/08/2023)   Hunger Vital Sign    Worried About Running Out of Food in the Last Year: Never true    Ran Out of Food in the Last Year: Never true  Transportation Needs: No Transportation Needs (02/08/2023)   PRAPARE - Administrator, Civil Service (Medical): No    Lack of Transportation (Non-Medical): No  Physical Activity: Insufficiently Active (02/08/2023)   Exercise Vital Sign    Days of Exercise per Week: 2 days    Minutes of Exercise per Session: 10 min  Stress: No Stress Concern Present (02/08/2023)   [employer] of Occupational Health - Occupational Stress Questionnaire    Feeling of Stress : Only a little  Social Connections: Moderately Isolated (02/08/2023)   Social Connection and Isolation Panel [NHANES]    Frequency of Communication with Friends and Family: More than three times a week    Frequency of Social Gatherings with Friends and Family: Three times a week    Attends Religious Services: 1 to 4 times per year    Active Member of Clubs or Organizations: No    Attends Engineer, Structural: Not on file    Marital Status: Never married  Intimate Partner Violence: Not on file     Review of Systems    General:  No chills, fever, night sweats or weight changes.  Cardiovascular:  No chest pain, dyspnea on exertion, edema, orthopnea, palpitations, paroxysmal nocturnal dyspnea. Dermatological: No rash, lesions/masses Respiratory: No cough, dyspnea Urologic: No hematuria, dysuria Abdominal:   No nausea, vomiting, diarrhea, bright red blood per rectum, melena, or hematemesis Neurologic:  No visual changes, wkns, changes in mental status. All  other systems reviewed and are otherwise negative except as noted above.  Physical Exam    VS:  BP 110/78   Pulse 78   Ht 5' 7 (1.702 m)   Wt 120 lb (54.4 kg)   LMP 02/09/2023  BMI 18.79 kg/m  , BMI Body mass index is 18.79 kg/m. GEN: Well nourished, well developed, in no acute distress. HEENT: normal. Neck: Supple, no JVD, carotid bruits, or masses. Cardiac: RRR, no murmurs, rubs, or gallops. No clubbing, cyanosis, edema.  Radials/DP/PT 2+ and equal bilaterally.  Respiratory:  Respirations regular and unlabored, clear to auscultation bilaterally. GI: Soft, nontender, nondistended, BS + x 4. MS: no deformity or atrophy. Skin: warm and dry, no rash. Neuro:  Strength and sensation are intact. Psych: Normal affect.  Accessory Clinical Findings    Recent Labs: 02/18/2023: ALT 15; BUN 12; Creatinine, Ser 0.67; Hemoglobin 14.7; Platelets 329; Potassium 3.7; Sodium 136   Recent Lipid Panel    Component Value Date/Time   CHOL 107 09/22/2022 1018   TRIG 75 09/22/2022 1018   HDL 47 09/22/2022 1018   CHOLHDL 2.3 09/22/2022 1018   LDLCALC 45 09/22/2022 1018         ECG personally reviewed by me today-EKG Interpretation Date/Time:  Thursday February 24 2023 15:06:49 EST Ventricular Rate:  78 PR Interval:  118 QRS Duration:  80 QT Interval:  394 QTC Calculation: 449 R Axis:   77  Text Interpretation: Normal sinus rhythm Normal ECG When compared with ECG of 28-Nov-2018 18:37, PREVIOUS ECG IS PRESENT Confirmed by Emelia Hazy [phone]) on 02/24/2023 3:11:33 PM   Echocardiogram 02/19/2019 IMPRESSIONS     1. The average left ventricular global longitudinal strain is -23.6 %.   2. Left ventricular ejection fraction, by visual estimation, is 60 to  65%. The left ventricle has normal function. There is no left ventricular  hypertrophy.   3. The left ventricle has no regional wall motion abnormalities.   4. Global right ventricle has normal systolic function.The right   ventricular size is normal. No increase in right ventricular wall  thickness.   5. Left atrial size was normal.   6. Right atrial size was normal.   7. The mitral valve is normal in structure. No evidence of mitral valve  regurgitation. No evidence of mitral stenosis.   8. The tricuspid valve is normal in structure.   9. The tricuspid valve is normal in structure. Tricuspid valve  regurgitation is trivial.  10. The aortic valve is normal in structure. Aortic valve regurgitation is  not visualized. No evidence of aortic valve sclerosis or stenosis.  11. The pulmonic valve was normal in structure. Pulmonic valve  regurgitation is not visualized.  12. Normal pulmonary artery systolic pressure.  13. The inferior vena cava is normal in size with greater than 50%  respiratory variability, suggesting right atrial pressure of 3 mmHg.   Cardiac event monitor 08/07/2021  Patch Wear Time:  12 days and 11 hours (2023-06-23T08:43:47-0400 to 2023-07-05T20:09:58-0400)   Patient had a min HR of 44 bpm, max HR of 150 bpm, and avg HR of 76 bpm. Predominant underlying rhythm was Sinus Rhythm. Isolated SVEs were rare (<1.0%), SVE Couplets were rare (<1.0%), and SVE Triplets were rare (<1.0%). Isolated VEs were rare (<1.0%,  784), VE Couplets were rare (<1.0%, 5), and VE Triplets were rare (<1.0%, 1).      Sinus bradycardia, NSR, sinus tachycardia, occasional PAC, PVC and 3 beats NSVT  Redell Shallow  Assessment & Plan   1.  Palpitations, NSVT-heart rate today 78 BPM.  Did not tolerate metoprolol .  Was transitioned to diltiazem  which she did not tolerate.    Was then transition to propranolol , retrialed metoprolol  and was then placed on bisoprolol .  Her previous cardiac  event monitor 08/07/2021 minimum heart rate 44 bpm, maximum heart rate 150, average heart rate 76 bpm, predominantly normal sinus rhythm, isolated SVE's, sinus bradycardia, sinus tachycardia, occasional PACs, PVCs, and 3 beats of NSVT.  Lab  work reviewed from PCP. Maintain p.o. hydration-reviewed Increase physical activity as tolerated-goal 150 minutes of moderate physical activity per week Avoid caffeine, chocolate, EtOH, dehydration etc.-reviewed Continue bisoprolol   Anxiety and depression-pleasant and calm in clinic today.  Less stressed now that her grandmother is discharged from the hospital. Continue current medical therapy Mindfulness stress reduction-reviewed Increase physical activity as tolerated Follows with PCP/psychotherapist  Disposition: Follow-up with Dr. Pietro or me in 9-12 months.   Josefa HERO. Ileana Chalupa NP-C     02/24/2023, 3:36 PM [facility] [address] Office [phone] Fax [phone]    I spent 14 minutes examining this patient, reviewing medications, and using patient centered shared decision making involving her cardiac care.  I spent  20 minutes reviewing her past medical history,  medications, and prior cardiac tests.

## 2023-02-17 ENCOUNTER — Encounter: Payer: Self-pay | Admitting: Family Medicine

## 2023-02-18 ENCOUNTER — Other Ambulatory Visit: Payer: Self-pay

## 2023-02-18 ENCOUNTER — Encounter (HOSPITAL_COMMUNITY): Payer: Self-pay

## 2023-02-18 ENCOUNTER — Encounter: Payer: Self-pay | Admitting: Family Medicine

## 2023-02-18 ENCOUNTER — Ambulatory Visit: Payer: 59 | Admitting: Family Medicine

## 2023-02-18 ENCOUNTER — Encounter: Payer: Self-pay | Admitting: Pharmacist

## 2023-02-18 ENCOUNTER — Emergency Department (HOSPITAL_COMMUNITY)
Admission: EM | Admit: 2023-02-18 | Discharge: 2023-02-19 | Discharge status: Against Medical Advice | Payer: 59 | Attending: Emergency Medicine | Admitting: Emergency Medicine

## 2023-02-18 VITALS — BP 116/84 | HR 116 | Ht 67.5 in | Wt 121.1 lb

## 2023-02-18 DIAGNOSIS — R112 Nausea with vomiting, unspecified: Secondary | ICD-10-CM | POA: Diagnosis not present

## 2023-02-18 DIAGNOSIS — R1013 Epigastric pain: Secondary | ICD-10-CM | POA: Insufficient documentation

## 2023-02-18 DIAGNOSIS — Z5321 Procedure and treatment not carried out due to patient leaving prior to being seen by health care provider: Secondary | ICD-10-CM | POA: Diagnosis not present

## 2023-02-18 DIAGNOSIS — R109 Unspecified abdominal pain: Secondary | ICD-10-CM | POA: Diagnosis not present

## 2023-02-18 LAB — CBC
HCT: 44.4 % (ref 36.0–46.0)
Hemoglobin: 14.7 g/dL (ref 12.0–15.0)
MCH: 30.3 pg (ref 26.0–34.0)
MCHC: 33.1 g/dL (ref 30.0–36.0)
MCV: 91.5 fL (ref 80.0–100.0)
Platelets: 329 10*3/uL (ref 150–400)
RBC: 4.85 MIL/uL (ref 3.87–5.11)
RDW: 12.2 % (ref 11.5–15.5)
WBC: 9.7 10*3/uL (ref 4.0–10.5)
nRBC: 0 % (ref 0.0–0.2)

## 2023-02-18 LAB — COMPREHENSIVE METABOLIC PANEL
ALT: 15 U/L (ref 0–44)
AST: 18 U/L (ref 15–41)
Albumin: 4.3 g/dL (ref 3.5–5.0)
Alkaline Phosphatase: 61 U/L (ref 38–126)
Anion gap: 10 (ref 5–15)
BUN: 12 mg/dL (ref 6–20)
CO2: 22 mmol/L (ref 22–32)
Calcium: 9.2 mg/dL (ref 8.9–10.3)
Chloride: 104 mmol/L (ref 98–111)
Creatinine, Ser: 0.67 mg/dL (ref 0.44–1.00)
GFR, Estimated: >60 mL/min (ref >60)
Glucose, Bld: 100 mg/dL — ABNORMAL HIGH (ref 70–99)
Potassium: 3.7 mmol/L (ref 3.5–5.1)
Sodium: 136 mmol/L (ref 135–145)
Total Bilirubin: 0.8 mg/dL (ref 0.0–1.2)
Total Protein: 7.7 g/dL (ref 6.5–8.1)

## 2023-02-18 LAB — LIPASE, BLOOD: Lipase: 41 U/L (ref 11–51)

## 2023-02-18 MED ORDER — KETOROLAC TROMETHAMINE 60 MG/2ML IM SOLN
60.0000 mg | Freq: Once | INTRAMUSCULAR | Status: AC
  Administered 2023-02-18: 60 mg via INTRAMUSCULAR

## 2023-02-18 NOTE — ED Notes (Signed)
Called pt several times to update vitals and pt seems to have left

## 2023-02-18 NOTE — ED Triage Notes (Signed)
Patient reports abdominal pain, nausea, and vomiting x 1 day. Seen at PCP and given GI cocktail without relief. Patient denies diarrhea and fevers. Scheduled to have a colonoscopy in 2 weeks.

## 2023-02-18 NOTE — Addendum Note (Signed)
Addended byRoselyn Reef on: 02/18/2023 10:48 AM   Modules accepted: Orders

## 2023-02-18 NOTE — Assessment & Plan Note (Signed)
Patient presents with one day of epigastric pain and "feeling like a rock" is sitting in her stomach. Does have a colonoscopy scheduled for two weeks.  - abd exam has good active bowel sounds, soft, no distension - pain with palpation to epigastric region - differentials include: norovirus (going around in community), gastric ulcer, pancreatitis (although pt does not have risk factors), food poisoning - with this being one day of abdominal pain that was sudden this could be food poisoning vs norovirus and I would recommend we wait on blood work and imaging until more symptoms develop or duration of symptoms.  Do recommend we give her a GI cocktail here in clinic to see if that helps in addition to toradol 60IM to help with significant pain control since she reports significant increasing pain to the point where she debating going to the ED - would also recommend PPI usage to see if this can help if this is a gastric ulcer - will go ahead and refill zofran - if no better in 3-4 days or pt has worsening of symptoms that don't point to norovirus/food poisoning we may need to get a CT scan - less likely this is a bowel obstruction given soft abdomen and bowel movements this am so I don't feel the need to order an abdominal xray

## 2023-02-18 NOTE — Progress Notes (Signed)
Established patient visit   Patient: Evelyn Goodwin   DOB: 1995-09-30   28 y.o. Female  MRN: [id number] Visit Date: 02/18/2023  Today's healthcare provider: Charlton Amor, DO   Chief Complaint  Patient presents with   Abdominal Pain    Pt states it started this morning feels like a rock is stuck    SUBJECTIVE    Chief Complaint  Patient presents with   Abdominal Pain    Pt states it started this morning feels like a rock is stuck   Abdominal Pain Pertinent negatives include no fever.   HPI     Abdominal Pain    Additional comments: Pt states it started this morning feels like a rock is stuck      Last edited by Roselyn Reef, CMA on 02/18/2023 10:06 AM.      Pt presents with one day of abdominal pain. Started last night after straining and then having diarrhea. Has had about 3 episodes of diarrhea this morning. Just go over RSV. Denies any vomiting but does admit to nausea. Localizes pain to epigastric region. 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  Review of Systems  Constitutional:  Negative for activity change, fatigue and fever.  Respiratory:  Negative for cough and shortness of breath.   Cardiovascular:  Negative for chest pain.  Gastrointestinal:  Positive for abdominal pain.  Genitourinary:  Negative for difficulty urinating.       Current Meds  Medication Sig   acetaminophen (TYLENOL) 500 MG tablet Take 1 tablet (500 mg total) by mouth every 6 (six) hours as needed for fever.   albuterol (VENTOLIN HFA) 108 (90 Base) MCG/ACT inhaler Inhale 2 puffs into the lungs every 6 (six) hours as needed for wheezing or shortness of breath.   ALPRAZolam (XANAX) 0.25 MG tablet Take 0.25 mg by mouth 3 (three) times daily as needed.   bisoprolol (ZEBETA) 5 MG tablet Take 1 tablet (5 mg total) by mouth at bedtime. May take extra 1/2 tablet as needed for palpitations   buPROPion (WELLBUTRIN XL) 300 MG 24 hr tablet Take 1 tablet (300 mg  total) by mouth daily.   cetirizine (ZYRTEC) 10 MG tablet Take by mouth.   chlorpheniramine-HYDROcodone (TUSSIONEX) 10-8 MG/5ML Take 5 mLs by mouth every 12 (twelve) hours as needed for cough (cough, will cause drowsiness.).   fluticasone (FLONASE) 50 MCG/ACT nasal spray Place 1 spray into both nostrils as needed.   guaiFENesin (MUCINEX) 600 MG 12 hr tablet Take 1 tablet (600 mg total) by mouth 2 (two) times daily for 7 days.   hydrOXYzine (ATARAX) 10 MG tablet Take 1 tablet (10 mg total) by mouth 3 (three) times daily as needed for itching.   ibuprofen (ADVIL) 800 MG tablet Take 1 tablet (800 mg total) by mouth every 8 (eight) hours as needed for moderate pain (pain score 4-6) or fever.   ondansetron (ZOFRAN-ODT) 4 MG disintegrating tablet Dissolve 1 tablet (4 mg total) by mouth every 8 (eight) hours as needed for nausea or vomiting.   risankizumab-rzaa (SKYRIZI PEN) 150 MG/ML pen Inject 150 mg into the skin as directed. Every 12 weeks for maintenance.    OBJECTIVE    BP 116/84 (BP Location: Left Arm, Patient Position: Sitting, Cuff Size: Normal)   Pulse (!) 116   Ht 5' 7.5" (1.715 m)   Wt 121 lb 2 oz (54.9 kg)   SpO2 98%   BMI 18.69 kg/m   Physical Exam  Vitals and nursing note reviewed.  Constitutional:      General: She is not in acute distress.    Appearance: Normal appearance.  HENT:     Head: Normocephalic and atraumatic.     Right Ear: External ear normal.     Left Ear: External ear normal.     Nose: Nose normal.  Eyes:     Conjunctiva/sclera: Conjunctivae normal.  Cardiovascular:     Rate and Rhythm: Normal rate and regular rhythm.  Pulmonary:     Effort: Pulmonary effort is normal.     Breath sounds: Normal breath sounds.  Abdominal:     General: Abdomen is flat. Bowel sounds are normal. There is no distension.     Palpations: Abdomen is soft.     Tenderness: There is abdominal tenderness in the epigastric area.  Neurological:     General: No focal deficit present.      Mental Status: She is alert and oriented to person, place, and time.  Psychiatric:        Mood and Affect: Mood normal.        Behavior: Behavior normal.        Thought Content: Thought content normal.        Judgment: Judgment normal.        ASSESSMENT & PLAN    Problem List Items Addressed This Visit       Other   Epigastric pain - Primary   Patient presents with one day of epigastric pain and "feeling like a rock" is sitting in her stomach. Does have a colonoscopy scheduled for two weeks.  - abd exam has good active bowel sounds, soft, no distension - pain with palpation to epigastric region - differentials include: norovirus (going around in community), gastric ulcer, pancreatitis (although pt does not have risk factors), food poisoning - with this being one day of abdominal pain that was sudden this could be food poisoning vs norovirus and I would recommend we wait on blood work and imaging until more symptoms develop or duration of symptoms.  Do recommend we give her a GI cocktail here in clinic to see if that helps in addition to toradol 60IM to help with significant pain control since she reports significant increasing pain to the point where she debating going to the ED - would also recommend PPI usage to see if this can help if this is a gastric ulcer - will go ahead and refill zofran - if no better in 3-4 days or pt has worsening of symptoms that don't point to norovirus/food poisoning we may need to get a CT scan - less likely this is a bowel obstruction given soft abdomen and bowel movements this am so I don't feel the need to order an abdominal xray       No follow-ups on file.      No orders of the defined types were placed in this encounter.   No orders of the defined types were placed in this encounter.    Charlton Amor, DO  [facility] at [facility] [phone] (phone) [phone] (fax)  [facility]

## 2023-02-21 ENCOUNTER — Encounter: Payer: Self-pay | Admitting: Gastroenterology

## 2023-02-21 ENCOUNTER — Other Ambulatory Visit: Payer: Self-pay | Admitting: Family Medicine

## 2023-02-21 ENCOUNTER — Other Ambulatory Visit (HOSPITAL_COMMUNITY): Payer: Self-pay

## 2023-02-21 DIAGNOSIS — R112 Nausea with vomiting, unspecified: Secondary | ICD-10-CM

## 2023-02-21 MED ORDER — ONDANSETRON 4 MG PO TBDP
4.0000 mg | ORAL_TABLET | Freq: Three times a day (TID) | ORAL | 2 refills | Status: DC | PRN
  Filled 2023-02-21: qty 20, 7d supply, fill #0
  Filled 2023-03-18: qty 20, 7d supply, fill #1
  Filled 2023-04-30: qty 20, 7d supply, fill #2

## 2023-02-22 ENCOUNTER — Other Ambulatory Visit (HOSPITAL_COMMUNITY): Payer: Self-pay

## 2023-02-22 ENCOUNTER — Other Ambulatory Visit: Payer: Self-pay

## 2023-02-23 ENCOUNTER — Other Ambulatory Visit (HOSPITAL_COMMUNITY): Payer: Self-pay

## 2023-02-24 ENCOUNTER — Encounter: Payer: Self-pay | Admitting: General Practice

## 2023-02-24 ENCOUNTER — Other Ambulatory Visit (HOSPITAL_COMMUNITY): Payer: Self-pay

## 2023-02-24 ENCOUNTER — Ambulatory Visit: Payer: 59 | Attending: General Practice | Admitting: General Practice

## 2023-02-24 VITALS — BP 110/78 | HR 78 | Ht 67.0 in | Wt 120.0 lb

## 2023-02-24 DIAGNOSIS — R002 Palpitations: Secondary | ICD-10-CM | POA: Diagnosis not present

## 2023-02-24 DIAGNOSIS — I4729 Other ventricular tachycardia: Secondary | ICD-10-CM

## 2023-02-24 NOTE — Patient Instructions (Signed)
 Medication Instructions:  The current medical regimen is effective;  continue present plan and medications as directed. Please refer to the Current Medication list given to you today.  *If you need a refill on your cardiac medications before your next appointment, please call your pharmacy*  Lab Work: NONE  Testing/Procedures: NONE  Follow-Up: At [facility], you and your health needs are our priority.  As part of our continuing mission to provide you with exceptional heart care, we have created designated Provider Care Teams.  These Care Teams include your primary Cardiologist (physician) and Advanced Practice Providers (APPs -  Physician Assistants and Nurse Practitioners) who all work together to provide you with the care you need, when you need it.  Your next appointment:   12 month(s)  Provider:   Redell Shallow, MD  or  MADISON FOUNTAIN, DNP

## 2023-02-25 ENCOUNTER — Encounter: Payer: Self-pay | Admitting: Family Medicine

## 2023-02-25 ENCOUNTER — Encounter: Payer: Self-pay | Admitting: Gastroenterology

## 2023-02-27 ENCOUNTER — Other Ambulatory Visit: Payer: Self-pay | Admitting: Family Medicine

## 2023-02-27 DIAGNOSIS — Z111 Encounter for screening for respiratory tuberculosis: Secondary | ICD-10-CM

## 2023-02-28 ENCOUNTER — Other Ambulatory Visit (HOSPITAL_COMMUNITY): Payer: Self-pay

## 2023-03-01 ENCOUNTER — Other Ambulatory Visit (HOSPITAL_COMMUNITY): Payer: Self-pay

## 2023-03-03 ENCOUNTER — Other Ambulatory Visit: Payer: Self-pay

## 2023-03-03 ENCOUNTER — Other Ambulatory Visit (HOSPITAL_COMMUNITY): Payer: Self-pay

## 2023-03-03 ENCOUNTER — Encounter (HOSPITAL_COMMUNITY): Payer: Self-pay

## 2023-03-03 NOTE — Progress Notes (Signed)
Specialty Pharmacy Ongoing Clinical Assessment Note  Evelyn Goodwin is a 28 y.o. female who is being followed by the specialty pharmacy service for RxSp Psoriatic Arthritis   Patient's specialty medication(s) reviewed today: Risankizumab-rzaa (Skyrizi Pen)   Missed doses in the last 4 weeks: 0   Patient/Caregiver did not have any additional questions or concerns.   Therapeutic benefit summary: Patient is achieving benefit   Adverse events/side effects summary: No adverse events/side effects   Patient's therapy is appropriate to: Continue    Goals Addressed             This Visit's Progress    Minimize recurrence of flares       Patient is on track. Patient will maintain adherence         Follow up:  6 months  Bobette Mo Specialty Pharmacist

## 2023-03-03 NOTE — Progress Notes (Signed)
Specialty Pharmacy Refill Coordination Note  Evelyn Goodwin is a 28 y.o. female contacted today regarding refills of specialty medication(s) Risankizumab-rzaa Cristy Folks Pen)   Patient requested Daryll Drown at [facility] at [place] date: 03/10/23   Medication will be filled on 03/09/23.

## 2023-03-04 ENCOUNTER — Encounter: Payer: Self-pay | Admitting: Gastroenterology

## 2023-03-04 ENCOUNTER — Ambulatory Visit (AMBULATORY_SURGERY_CENTER): Payer: 59 | Admitting: Gastroenterology

## 2023-03-04 VITALS — BP 117/78 | HR 59 | Temp 97.7°F | Resp 16 | Ht 67.5 in | Wt 123.0 lb

## 2023-03-04 DIAGNOSIS — Z8719 Personal history of other diseases of the digestive system: Secondary | ICD-10-CM | POA: Diagnosis not present

## 2023-03-04 DIAGNOSIS — F419 Anxiety disorder, unspecified: Secondary | ICD-10-CM | POA: Diagnosis not present

## 2023-03-04 DIAGNOSIS — R1031 Right lower quadrant pain: Secondary | ICD-10-CM | POA: Diagnosis not present

## 2023-03-04 DIAGNOSIS — F32A Depression, unspecified: Secondary | ICD-10-CM | POA: Diagnosis not present

## 2023-03-04 MED ORDER — SODIUM CHLORIDE 0.9 % IV SOLN: 500.0000 mL | Freq: Once | INTRAVENOUS | Status: DC

## 2023-03-04 NOTE — Progress Notes (Signed)
[facility] History and Physical   Primary Care Physician:  Charlton Amor, DO   Reason for Procedure:   RLQ pain, history of colitis  Plan:    colonoscopy     HPI: Evelyn Goodwin is a 28 y.o. female  here for colonoscopy to further evaluate persistent RLQ pain. History perforated appendix, post op C diff, had some colonic inflammation on last colonoscopy 2021. History of psoriasis and arthritis on Skyrizi - here for colonoscopy for persistent RLQ pain, ensure no Crohn's disease.   . Patient denies any bowel symptoms at this time. No family history of colon cancer known. Otherwise feels well without any cardiopulmonary symptoms.   I have discussed risks / benefits of anesthesia and endoscopic procedure with Evelyn Goodwin and they wish to proceed with the exams as outlined today.    Past Medical History:  Diagnosis Date   Anxiety    Clostridium difficile infection    Depression    Psoriasis    Scoliosis     Past Surgical History:  Procedure Laterality Date   LAPAROSCOPIC APPENDECTOMY N/A 05/07/2019   Procedure: APPENDECTOMY LAPAROSCOPIC;  Surgeon: Violeta Gelinas, MD;  Location: [facility] OR;  Service: General;  Laterality: N/A;   MOUTH SURGERY     28 year old molers removed   WISDOM TOOTH EXTRACTION  2014    Prior to Admission medications   Medication Sig Start Date End Date Taking? Authorizing Provider  bisoprolol (ZEBETA) 5 MG tablet Take 1 tablet (5 mg total) by mouth at bedtime. May take extra 1/2 tablet as needed for palpitations 11/01/22  Yes Lewayne Bunting, MD  buPROPion (WELLBUTRIN XL) 300 MG 24 hr tablet Take 1 tablet (300 mg total) by mouth daily. 01/04/23  Yes Charlton Amor, DO  hydrOXYzine (ATARAX) 10 MG tablet Take 1 tablet (10 mg total) by mouth 3 (three) times daily as needed for itching. 10/21/22  Yes Wachs, Erika S, DO  ondansetron (ZOFRAN-ODT) 4 MG disintegrating tablet Dissolve 1 tablet (4 mg total) by mouth every 8 (eight) hours as  needed for nausea or vomiting. 02/21/23  Yes Morey Hummingbird S, DO  acetaminophen (TYLENOL) 500 MG tablet Take 1 tablet (500 mg total) by mouth every 6 (six) hours as needed for fever. 02/09/23   Charlton Amor, DO  albuterol (VENTOLIN HFA) 108 (90 Base) MCG/ACT inhaler Inhale 2 puffs into the lungs every 6 (six) hours as needed for wheezing or shortness of breath. 02/09/23   Tamera Punt, Colbert Coyer, DO  ALPRAZolam (XANAX) 0.25 MG tablet Take 0.25 mg by mouth 3 (three) times daily as needed. 03/24/21   [provider]  cetirizine (ZYRTEC) 10 MG tablet Take by mouth.    [provider]  fluticasone (FLONASE) 50 MCG/ACT nasal spray Place 1 spray into both nostrils as needed.    [provider]  ibuprofen (ADVIL) 800 MG tablet Take 1 tablet (800 mg total) by mouth every 8 (eight) hours as needed for moderate pain (pain score 4-6) or fever. 02/09/23   Charlton Amor, DO  risankizumab-rzaa (SKYRIZI PEN) 150 MG/ML pen Inject 150 mg into the skin as directed. Every 12 weeks for maintenance. 12/13/22   Quentin Angst, MD    Current Outpatient Medications  Medication Sig Dispense Refill   bisoprolol (ZEBETA) 5 MG tablet Take 1 tablet (5 mg total) by mouth at bedtime. May take extra 1/2 tablet as needed for palpitations 135 tablet 3   buPROPion (WELLBUTRIN XL) 300 MG 24 hr  tablet Take 1 tablet (300 mg total) by mouth daily. 30 tablet 1   hydrOXYzine (ATARAX) 10 MG tablet Take 1 tablet (10 mg total) by mouth 3 (three) times daily as needed for itching. 30 tablet 3   ondansetron (ZOFRAN-ODT) 4 MG disintegrating tablet Dissolve 1 tablet (4 mg total) by mouth every 8 (eight) hours as needed for nausea or vomiting. 20 tablet 2   acetaminophen (TYLENOL) 500 MG tablet Take 1 tablet (500 mg total) by mouth every 6 (six) hours as needed for fever. 30 tablet 0   albuterol (VENTOLIN HFA) 108 (90 Base) MCG/ACT inhaler Inhale 2 puffs into the lungs every 6 (six) hours as needed for wheezing or shortness of  breath. 8 g 0   ALPRAZolam (XANAX) 0.25 MG tablet Take 0.25 mg by mouth 3 (three) times daily as needed.     cetirizine (ZYRTEC) 10 MG tablet Take by mouth.     fluticasone (FLONASE) 50 MCG/ACT nasal spray Place 1 spray into both nostrils as needed.     ibuprofen (ADVIL) 800 MG tablet Take 1 tablet (800 mg total) by mouth every 8 (eight) hours as needed for moderate pain (pain score 4-6) or fever. 30 tablet 0   risankizumab-rzaa (SKYRIZI PEN) 150 MG/ML pen Inject 150 mg into the skin as directed. Every 12 weeks for maintenance. 1 mL 1   Current Facility-Administered Medications  Medication Dose Route Frequency Provider Last Rate Last Admin   0.9 %  sodium chloride infusion  500 mL Intravenous Once Derwin Reddy, Willaim Rayas, MD        Allergies as of 03/04/2023 - Review Complete 03/04/2023  Allergen Reaction Noted   Vancomycin Rash 07/24/2019   Diltiazem Palpitations 03/02/2022   Diphenhydramine hcl Other (See Comments) 04/23/2022   Metoprolol Other (See Comments) 09/03/2022   Penicillin g Rash 05/10/2016   Penicillins Rash 03/04/2023   Vraylar [cariprazine] Nausea And Vomiting 06/25/2022    Family History  Problem Relation Age of Onset   Psoriasis Mother    Healthy Brother    Hypertension Maternal Grandmother    Hypertension Maternal Grandfather    Hypertension Paternal Grandmother    Hypertension Paternal Grandfather    Hypertension Other    Heart attack Other    Breast cancer Other    Colon cancer Neg Hx    Esophageal cancer Neg Hx    Pancreatic cancer Neg Hx    Stomach cancer Neg Hx    Rectal cancer Neg Hx     Social History   Socioeconomic History   Marital status: Single    Spouse name: Not on file   Number of children: 0   Years of education: Not on file   Highest education level: Some college, no degree  Occupational History   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 [employer]    Occupation: Engineer, site  Tobacco Use   Smoking status: Never    Smokeless tobacco: Never  Vaping Use   Vaping status: Some Days   Substances: Nicotine, Flavoring  Substance and Sexual Activity   Alcohol use: Yes    Comment: Rare   Drug use: Not Currently   Sexual activity: Yes    Birth control/protection: Surgical    Comment: vasectomy -  Other Topics Concern   Not on file  Social History Narrative   Not on file   Social Drivers of Health   Financial Resource Strain: Low Risk  (02/08/2023)   Overall Financial Resource Strain (CARDIA)  Difficulty of Paying Living Expenses: Not hard at all  Food Insecurity: No Food Insecurity (02/08/2023)   Hunger Vital Sign    Worried About Running Out of Food in the Last Year: Never true    Ran Out of Food in the Last Year: Never true  Transportation Needs: No Transportation Needs (02/08/2023)   PRAPARE - Administrator, Civil Service (Medical): No    Lack of Transportation (Non-Medical): No  Physical Activity: Insufficiently Active (02/08/2023)   Exercise Vital Sign    Days of Exercise per Week: 2 days    Minutes of Exercise per Session: 10 min  Stress: No Stress Concern Present (02/08/2023)   [employer] of Occupational Health - Occupational Stress Questionnaire    Feeling of Stress : Only a little  Social Connections: Moderately Isolated (02/08/2023)   Social Connection and Isolation Panel [NHANES]    Frequency of Communication with Friends and Family: More than three times a week    Frequency of Social Gatherings with Friends and Family: Three times a week    Attends Religious Services: 1 to 4 times per year    Active Member of Clubs or Organizations: No    Attends Engineer, structural: Not on file    Marital Status: Never married  Intimate Partner Violence: Not on file    Review of Systems: All other review of systems negative except as mentioned in the HPI.  Physical Exam: Vital signs BP 107/83   Pulse 88   Temp 97.7 F (36.5 C) (Skin)   Ht 5' 7.5" (1.715 m)    Wt 123 lb (55.8 kg)   LMP 02/09/2023   SpO2 99%   BMI 18.98 kg/m   General:   Alert,  Well-developed, pleasant and cooperative in NAD Lungs:  Clear throughout to auscultation.   Heart:  Regular rate and rhythm Abdomen:  Soft, nontender and nondistended.   Neuro/Psych:  Alert and cooperative. Normal mood and affect. A and O x 3  Harlin Rain, MD [facility]

## 2023-03-04 NOTE — Progress Notes (Signed)
To pacu, VSs. Report to Rn.tb

## 2023-03-04 NOTE — Progress Notes (Signed)
Called to room to assist during endoscopic procedure.  Patient ID and intended procedure confirmed with present staff. Received instructions for my participation in the procedure from the performing physician.

## 2023-03-04 NOTE — Op Note (Signed)
[facility] Patient Name: Evelyn Goodwin Procedure Date: 03/04/2023 7:56 AM MRN: [id number] Endoscopist: Viviann Spare P. Adela Lank , MD, [id number] Age: 28 Referring MD:  Date of Birth: 16-Jul-1995 Gender: Female Account #: [id number] Procedure:                Colonoscopy Indications:              Abdominal pain in the right lower quadrant -                            history of appendicitis, post operative C diff.                            Colonoscopy 2021 showed nonspecific colitis without                            chronicity. She has a history of psoriasis /                            arthritis on Skyrizi, persistent RLQ pain.                            Colonoscopy to ensure no evidence of Crohn's                            disease. CT scan 02/2022 showed no cause for pain. Medicines:                Monitored Anesthesia Care Procedure:                Pre-Anesthesia Assessment:                           - Prior to the procedure, a History and Physical                            was performed, and patient medications and                            allergies were reviewed. The patient's tolerance of                            previous anesthesia was also reviewed. The risks                            and benefits of the procedure and the sedation                            options and risks were discussed with the patient.                            All questions were answered, and informed consent                            was obtained. Prior Anticoagulants: The patient has  taken no anticoagulant or antiplatelet agents. ASA                            Grade Assessment: II - A patient with mild systemic                            disease. After reviewing the risks and benefits,                            the patient was deemed in satisfactory condition to                            undergo the procedure.                           After obtaining  informed consent, the colonoscope                            was passed under direct vision. Throughout the                            procedure, the patient's blood pressure, pulse, and                            oxygen saturations were monitored continuously. The                            Olympus Scope [id number] was introduced through the                            anus and advanced to the the terminal ileum, with                            identification of the appendiceal orifice and IC                            valve. The colonoscopy was performed without                            difficulty. The patient tolerated the procedure                            well. The quality of the bowel preparation was                            adequate. The terminal ileum, ileocecal valve,                            appendiceal orifice, and rectum were photographed. Scope In: 8:03:45 AM Scope Out: 8:25:57 AM Scope Withdrawal Time: 0 hours 17 minutes 30 seconds  Total Procedure Duration: 0 hours 22 minutes 12 seconds  Findings:                 The perianal and digital rectal examinations were  normal.                           The terminal ileum appeared normal.                           A large amount of liquid stool was found in the                            entire colon, making visualization difficult.                            Lavage of the colon was performed using copious                            amounts of sterile water, resulting in clearance                            with adequate visualization. This prolonged the                            exam.                           The exam was otherwise without abnormality. No                            active inflammation. The left colon had multiple                            angulated turns, difficult cecal intubation.                           Biopsies were taken with a cold forceps for                             histology - ensure no IBD. Complications:            No immediate complications. Estimated blood loss:                            Minimal. Estimated Blood Loss:     Estimated blood loss was minimal. Impression:               - The examined portion of the ileum was normal.                           - Stool in the entire examined colon leading to                            lavage.                           - The examination was otherwise normal. No obvious  inflammatory changes.                           - Biopsies were taken with a cold forceps for                            histology.                           No overt evidence of IBD or concerning pathology on                            this exam to cause RLQ pain. Recommendation:           - Patient has a contact number available for                            emergencies. The signs and symptoms of potential                            delayed complications were discussed with the                            patient. Return to normal activities tomorrow.                            Written discharge instructions were provided to the                            patient.                           - Resume previous diet.                           - Continue present medications.                           - Trial of IB gard if not yet done, treat for                            possible bowel spasm. We can give samples to use as                            needed.                           - Await pathology results. Viviann Spare P. Kanyon Bunn, MD 03/04/2023 8:33:33 AM This report has been signed electronically.

## 2023-03-04 NOTE — Progress Notes (Signed)
4 boxes of IB Guard samples provided to patient per Dr. Adela Lank.

## 2023-03-04 NOTE — Patient Instructions (Signed)

## 2023-03-07 ENCOUNTER — Telehealth: Payer: Self-pay | Admitting: *Deleted

## 2023-03-07 ENCOUNTER — Telehealth: Payer: Self-pay | Admitting: Gastroenterology

## 2023-03-07 NOTE — Telephone Encounter (Signed)
 Patient reviewed and responded to MyChart message.

## 2023-03-07 NOTE — Telephone Encounter (Signed)
 MyChart message sent to patient.

## 2023-03-07 NOTE — Telephone Encounter (Signed)
  Follow up Call-     03/04/2023    7:33 AM 11/19/2020    8:36 AM  Call back number  Post procedure Call Back phone  # [phone]  Permission to leave phone message Yes Yes   LMOM

## 2023-03-07 NOTE — Telephone Encounter (Signed)
Patient is requesting to speak with a nurse stating she has not had a bowel movement since procedure done on 03/04/2023, patient is concerned whether if it's normal. Please advise.

## 2023-03-08 LAB — SURGICAL PATHOLOGY

## 2023-03-09 ENCOUNTER — Other Ambulatory Visit (HOSPITAL_COMMUNITY): Payer: Self-pay

## 2023-03-09 ENCOUNTER — Encounter: Payer: Self-pay | Admitting: Gastroenterology

## 2023-03-09 ENCOUNTER — Other Ambulatory Visit: Payer: Self-pay

## 2023-03-10 ENCOUNTER — Other Ambulatory Visit (HOSPITAL_COMMUNITY): Payer: Self-pay

## 2023-03-10 ENCOUNTER — Other Ambulatory Visit: Payer: Self-pay

## 2023-03-14 ENCOUNTER — Ambulatory Visit: Payer: 59 | Admitting: Dermatology

## 2023-03-14 ENCOUNTER — Other Ambulatory Visit (HOSPITAL_COMMUNITY): Payer: Self-pay

## 2023-03-18 ENCOUNTER — Other Ambulatory Visit (HOSPITAL_COMMUNITY): Payer: Self-pay

## 2023-04-05 ENCOUNTER — Other Ambulatory Visit: Payer: Self-pay

## 2023-04-12 ENCOUNTER — Other Ambulatory Visit (HOSPITAL_COMMUNITY): Payer: Self-pay

## 2023-04-14 ENCOUNTER — Encounter (INDEPENDENT_AMBULATORY_CARE_PROVIDER_SITE_OTHER): Payer: Self-pay | Admitting: Family Medicine

## 2023-04-14 DIAGNOSIS — R040 Epistaxis: Secondary | ICD-10-CM | POA: Diagnosis not present

## 2023-04-14 DIAGNOSIS — J301 Allergic rhinitis due to pollen: Secondary | ICD-10-CM | POA: Diagnosis not present

## 2023-04-14 NOTE — Telephone Encounter (Signed)
 Please see the MyChart message reply(ies) for my assessment and plan.    This patient gave consent for this Medical Advice Message and is aware that it may result in a bill to [employer], as well as the possibility of receiving a bill for a co-payment or deductible. They are an established patient, but are not seeking medical advice exclusively about a problem treated during an in person or video visit in the last seven days. I did not recommend an in person or video visit within seven days of my reply.    I spent a total of 6 minutes cumulative time within 7 days through [employer].  Everrett Coombe, DO

## 2023-04-21 ENCOUNTER — Ambulatory Visit: Payer: 59 | Admitting: Dermatology

## 2023-04-21 ENCOUNTER — Encounter: Payer: Self-pay | Admitting: Dermatology

## 2023-04-21 VITALS — BP 108/69

## 2023-04-21 DIAGNOSIS — I7300 Raynaud's syndrome without gangrene: Secondary | ICD-10-CM | POA: Diagnosis not present

## 2023-04-21 DIAGNOSIS — K588 Other irritable bowel syndrome: Secondary | ICD-10-CM | POA: Diagnosis not present

## 2023-04-21 DIAGNOSIS — L4050 Arthropathic psoriasis, unspecified: Secondary | ICD-10-CM

## 2023-04-21 DIAGNOSIS — L409 Psoriasis, unspecified: Secondary | ICD-10-CM | POA: Diagnosis not present

## 2023-04-21 NOTE — Patient Instructions (Addendum)
 Hello Evelyn Goodwin,  Thank you for visiting today. Here is a summary of the key instructions:  - Medications:   - Continue taking Skyrizi as prescribed for psoriasis and psoriatic arthritis   - Keep taking bisoprolol as prescribed  - Tests and Screenings:   - Make sure your TB screening is up to date  - Lifestyle Recommendations:   - Keep moving to promote circulation   - Avoid crossing legs and keep feet straight when sitting   - Wear closed-toe shoes and socks   - Use hand warmers if feet get cold   - Avoid environments that trigger Raynaud's symptoms  - Follow-up:   - Schedule a yearly follow-up appointment   - Contact the office if there are any changes or issues with insurance  - Other Instructions:   - Check dorsal pedis pulses regularly to ensure strong blood flow   - Contact the office if you need Skyrizi samples due to insurance issues  Please reach out if you have any questions or concerns.  Warm regards,  Dr. Langston Reusing [facility]at[facility] Important Information  Due to recent changes in healthcare laws, you may see results of your pathology and/or laboratory studies on MyChart before the doctors have had a chance to review them. We understand that in some cases there may be results that are confusing or concerning to you. Please understand that not all results are received at the same time and often the doctors may need to interpret multiple results in order to provide you with the best plan of care or course of treatment. Therefore, we ask that you please give [place] 2 business days to thoroughly review all your results before contacting the office for clarification. Should we see a critical lab result, you will be contacted sooner.   If You Need Anything After Your Visit  If you have any questions or concerns for your doctor, please call our main line at [phone] If no one answers, please leave a voicemail as directed and we will return your call as soon as possible.  Messages left after 4 pm will be answered the following business day.   You may also send [place] a message via MyChart. We typically respond to MyChart messages within 1-2 business days.  For prescription refills, please ask your pharmacy to contact our office. Our fax number is [phone].  If you have an urgent issue when the clinic is closed that cannot wait until the next business day, you can page your doctor at the number below.    Please note that while we do our best to be available for urgent issues outside of office hours, we are not available 24/7.   If you have an urgent issue and are unable to reach [place], you may choose to seek medical care at your doctor's office, retail clinic, urgent care center, or emergency room.  If you have a medical emergency, please immediately call 911 or go to the emergency department. In the event of inclement weather, please call our main line at [phone] for an update on the status of any delays or closures.  Dermatology Medication Tips: Please keep the boxes that topical medications come in in order to help keep track of the instructions about where and how to use these. Pharmacies typically print the medication instructions only on the boxes and not directly on the medication tubes.   If your medication is too expensive, please contact our office at [phone] or send [place] a message through MyChart.  We are unable to tell what your co-pay for medications will be in advance as this is different depending on your insurance coverage. However, we may be able to find a substitute medication at lower cost or fill out paperwork to get insurance to cover a needed medication.   If a prior authorization is required to get your medication covered by your insurance company, please allow [place] 1-2 business days to complete this process.  Drug prices often vary depending on where the prescription is filled and some pharmacies may offer cheaper prices.  The  website www.goodrx.com contains coupons for medications through different pharmacies. The prices here do not account for what the cost may be with help from insurance (it may be cheaper with your insurance), but the website can give you the price if you did not use any insurance.  - You can print the associated coupon and take it with your prescription to the pharmacy.  - You may also stop by our office during regular business hours and pick up a GoodRx coupon card.  - If you need your prescription sent electronically to a different pharmacy, notify our office through [facility] or by phone at [phone]

## 2023-04-21 NOTE — Progress Notes (Signed)
   Follow-Up Visit   Subjective  Evelyn Goodwin is a 28 y.o. female who presents for the following: Psoriasis and psoriatic arthritis - 6 month follow up. She is taking Skyrizi every 3 months. She states her psoriasis is dormant. Her hands start getting stiff when it is closer to her injection.    The following portions of the chart were reviewed this encounter and updated as appropriate: medications, allergies, medical history  Review of Systems:  No other skin or systemic complaints except as noted in HPI or Assessment and Plan.  Objective  Well appearing patient in no apparent distress; mood and affect are within normal limits.  Areas Examined: Bilateral feet  Relevant exam findings are noted in the Assessment and Plan.      Assessment & Plan   1. Psoriasis, Psoriatic Arthritis, and Post-infectious Irritable Bowel - Assessment: Patient has been on Skyrizi for almost 2.5 years. Medication appears effective, with joint and gut flares only when due for an injection, but no skin flares. Previously on Cimzia for joints and Cosentyx for psoriasis, but Cosentyx caused a flare-up.  - Plan:    Refill Skyrizi    Ensure TB screening is up to date    Yearly follow-up    Samples available if insurance coverage issues arise  2. Raynaud's Syndrome - Assessment: Patient reports symptoms consistent with Raynaud's syndrome, including cold, clammy extremities and purple discoloration of the foot, sometimes unrelated to temperature. Currently has good capillary reflex and pulses. Patient is on bisoprolol, a beta-blocker, for cardiac issues.  - Plan:    Consider calcium channel blockers, pending consultation with cardiologist    Recommend keeping extremities moving to promote circulation    Advise wearing closed-toed shoes and socks    Suggest using hand warmers for cold feet    Recommend avoiding environments that trigger symptoms    Consult with cardiologist Arlys John) about low-dose  calcium channel blocker, monitoring for hypotension and PVCs      Return in about 1 year (around 04/20/2024) for Psoriasis.  I, Evelyn Goodwin, CMA, am acting as scribe for [employer], DO .   Documentation: I have reviewed the above documentation for accuracy and completeness, and I agree with the above.  Langston Reusing, DO

## 2023-04-26 ENCOUNTER — Other Ambulatory Visit (HOSPITAL_COMMUNITY): Payer: Self-pay

## 2023-04-26 ENCOUNTER — Other Ambulatory Visit: Payer: Self-pay

## 2023-04-26 ENCOUNTER — Other Ambulatory Visit: Payer: Self-pay | Admitting: Dermatology

## 2023-04-26 MED ORDER — SKYRIZI PEN 150 MG/ML ~~LOC~~ SOAJ
150.0000 mg | SUBCUTANEOUS | 1 refills | Status: DC
  Filled 2023-04-26 – 2023-05-04 (×2): qty 1, 84d supply, fill #0

## 2023-04-26 NOTE — Progress Notes (Signed)
 Patient called to request that we start the PA process before her next refill.  She reports that she is due to inject around 4/25, faxed for refill to start the PA process.  Her next refill call is 05/06/23.

## 2023-04-26 NOTE — Telephone Encounter (Signed)
 Task completed. Copy of the patient's immunization is available for pickup at the front office.   Copied from CRM [id number]. Topic: Medical Record Request - Other >> Apr 26, 2023  9:41 AM Evelyn Goodwin H wrote: Reason for CRM: Patient is wanting a copy of her shot records she will be by in the morning  if not tomorrow afternoon on 04/27/2023 to pick them up.

## 2023-04-27 ENCOUNTER — Telehealth: Payer: Self-pay | Admitting: Pharmacist

## 2023-04-27 NOTE — Telephone Encounter (Signed)
 Called patient to schedule an appointment for the [facility] [employer] Special[facility]ication [facility]. I was unable to reach the patient so I left a HIPAA-compliant message requesting that the patient return my call.   Butch Penny, PharmD, Patsy Baltimore, CPP Clinical Pharmacist [facility] & [facility] [phone]

## 2023-04-28 ENCOUNTER — Encounter: Payer: Self-pay | Admitting: Dermatology

## 2023-04-30 ENCOUNTER — Other Ambulatory Visit: Payer: Self-pay | Admitting: Family Medicine

## 2023-05-02 ENCOUNTER — Other Ambulatory Visit (HOSPITAL_COMMUNITY): Payer: Self-pay

## 2023-05-02 MED ORDER — FLUTICASONE PROPIONATE 50 MCG/ACT NA SUSP
2.0000 | NASAL | 3 refills | Status: AC | PRN
  Filled 2023-05-02: qty 16, 30d supply, fill #0

## 2023-05-03 ENCOUNTER — Other Ambulatory Visit: Payer: Self-pay

## 2023-05-04 ENCOUNTER — Ambulatory Visit: Attending: Family Medicine | Admitting: Pharmacist

## 2023-05-04 ENCOUNTER — Other Ambulatory Visit: Payer: Self-pay

## 2023-05-04 ENCOUNTER — Other Ambulatory Visit (HOSPITAL_COMMUNITY): Payer: Self-pay

## 2023-05-04 ENCOUNTER — Encounter: Payer: Self-pay | Admitting: Pharmacist

## 2023-05-04 DIAGNOSIS — Z79899 Other long term (current) drug therapy: Secondary | ICD-10-CM

## 2023-05-04 MED ORDER — SKYRIZI PEN 150 MG/ML ~~LOC~~ SOAJ
150.0000 mg | SUBCUTANEOUS | 1 refills | Status: DC
  Filled 2023-05-04 (×2): qty 1, 84d supply, fill #0

## 2023-05-04 NOTE — Progress Notes (Signed)
 Specialty Pharmacy Refill Coordination Note  Evelyn Goodwin is a 28 y.o. female contacted today regarding refills of specialty medication(s) Risankizumab-rzaa [place])   Patient requested [place] [facility] at [facility] at [place] date: 05/25/23   Medication will be filled on 05/24/23.   Patient pending visit with [facility]. She is aware she must speak with him before this will get filled.

## 2023-05-04 NOTE — Progress Notes (Signed)
   S: Patient presents for review of their specialty medication therapy.  Patient is currently taking Skyrizi for psoriasis. Patient is establishing with Dr. Myrtie Atkinson for this.   Adherence: confirms  Efficacy: reports good results.  Dosing: Plaque psoriasis, moderate to severe: SubQ: Two consecutive injections (75 mg each) for a total dose of 150 mg at weeks 0, 4, and then every 12 weeks thereafter.  Screening: TB test: completed per patient   Monitoring: S/sx of infection: none  S/sx of hypersensitivity/injection site reaction: none   O:     Lab Results  Component Value Date   WBC 9.7 02/18/2023   HGB 14.7 02/18/2023   HCT 44.4 02/18/2023   MCV 91.5 02/18/2023   PLT 329 02/18/2023      Chemistry      Component Value Date/Time   NA 136 02/18/2023 1902   NA 141 09/22/2022 1018   K 3.7 02/18/2023 1902   CL 104 02/18/2023 1902   CO2 22 02/18/2023 1902   BUN 12 02/18/2023 1902   BUN 12 09/22/2022 1018   CREATININE 0.67 02/18/2023 1902      Component Value Date/Time   CALCIUM 9.2 02/18/2023 1902   ALKPHOS 61 02/18/2023 1902   AST 18 02/18/2023 1902   ALT 15 02/18/2023 1902   BILITOT 0.8 02/18/2023 1902       A/P: 1. Medication review: Patient currently on Skyrizi for psoriasis. Reviewed the medication with the patient, including the following: Skyrizi is a monoclonal antibody used in the treatment of psoriasis. Patient educated on purpose, proper use and potential adverse effects of Skyrizi. Possible adverse effects are infections, headache, and injection site reactions. Live vaccinations should be avoided while on therapy. SubQ: Administer the two consecutive injections subcutaneously at different anatomic locations, such as thighs, abdomen, or back of upper arms. Do not inject into areas where the skin is tender, bruised, red, hard, or affected by psoriasis. Intended for use under supervision of a health care professional; self-injection may occur after proper  training (except back of upper arms). No recommendations for any changes at this time.   Marene Shape, PharmD, Becky Bowels, CPP Clinical Pharmacist [facility] [phone]

## 2023-05-05 ENCOUNTER — Other Ambulatory Visit: Payer: Self-pay

## 2023-05-05 MED ORDER — SKYRIZI PEN 150 MG/ML ~~LOC~~ SOAJ
150.0000 mg | SUBCUTANEOUS | 1 refills | Status: DC
  Filled 2023-05-06: qty 1, 84d supply, fill #0

## 2023-05-05 NOTE — Telephone Encounter (Signed)
 Can you upload her TB results and send the escript for the skyrizi and let the pt know we sent her refill.  The PA will come after her script is processed.  Thanks

## 2023-05-06 ENCOUNTER — Other Ambulatory Visit: Payer: Self-pay

## 2023-05-06 ENCOUNTER — Other Ambulatory Visit: Payer: Self-pay | Admitting: Pharmacist

## 2023-05-06 ENCOUNTER — Other Ambulatory Visit (HOSPITAL_COMMUNITY): Payer: Self-pay

## 2023-05-06 MED ORDER — SKYRIZI PEN 150 MG/ML ~~LOC~~ SOAJ
150.0000 mg | SUBCUTANEOUS | 1 refills | Status: DC
  Filled 2023-05-06 – 2023-07-21 (×2): qty 1, 84d supply, fill #0
  Filled 2023-09-20 – 2023-10-07 (×3): qty 1, 84d supply, fill #1

## 2023-05-11 ENCOUNTER — Other Ambulatory Visit (HOSPITAL_COMMUNITY): Payer: Self-pay

## 2023-05-11 ENCOUNTER — Other Ambulatory Visit: Payer: Self-pay

## 2023-05-24 ENCOUNTER — Other Ambulatory Visit (HOSPITAL_COMMUNITY): Payer: Self-pay

## 2023-05-24 ENCOUNTER — Other Ambulatory Visit: Payer: Self-pay

## 2023-05-26 ENCOUNTER — Encounter: Payer: Self-pay | Admitting: Family Medicine

## 2023-05-26 ENCOUNTER — Other Ambulatory Visit: Payer: Self-pay | Admitting: Family Medicine

## 2023-05-26 ENCOUNTER — Other Ambulatory Visit (HOSPITAL_COMMUNITY): Payer: Self-pay

## 2023-05-26 DIAGNOSIS — R112 Nausea with vomiting, unspecified: Secondary | ICD-10-CM

## 2023-05-26 MED ORDER — ONDANSETRON 4 MG PO TBDP
4.0000 mg | ORAL_TABLET | Freq: Three times a day (TID) | ORAL | 0 refills | Status: DC | PRN
  Filled 2023-05-26 – 2023-07-04 (×2): qty 20, 7d supply, fill #0

## 2023-05-26 NOTE — Telephone Encounter (Signed)
 Copied from CRM [id number]. Topic: Clinical - Medication Refill >> May 26, 2023  2:40 PM Kevelyn M wrote: Medication: ondansetron  (ZOFRAN -ODT) 4 MG  Has the patient contacted their pharmacy? Yes (Agent: If no, request that the patient contact the pharmacy for the refill. If patient does not wish to contact the pharmacy document the reason why and proceed with request.) (Agent: If yes, when and what did the pharmacy advise?)  This is the patient's preferred pharmacy:  [place] [facility] [address] [place] [address] Phone: [phone] Fax: [phone]  Is this the correct pharmacy for this prescription? No If no, delete pharmacy and type the correct one.   Has the prescription been filled recently? No  Is the patient out of the medication? No, but is able to run  Has the patient been seen for an appointment in the last year OR does the patient have an upcoming appointment? Yes  Can we respond through MyChart? Yes  If Dr. Dianah Fort could send in a few refills before she leaves, the patient is requesting  Agent: Please be advised that Rx refills may take up to 3 business days. We ask that you follow-up with your pharmacy.

## 2023-06-03 ENCOUNTER — Other Ambulatory Visit: Payer: Self-pay

## 2023-06-03 ENCOUNTER — Other Ambulatory Visit (HOSPITAL_COMMUNITY): Payer: Self-pay

## 2023-06-07 ENCOUNTER — Other Ambulatory Visit (HOSPITAL_COMMUNITY): Payer: Self-pay

## 2023-06-07 NOTE — Progress Notes (Signed)
 Spoke to patient initially wanted to pick up 05/25/23. Patient changed mind and pick up on 05/26/23 afternoon. Patient changed mind again and will pickup 5/17- patient is contemplating about pick up med due to cost.  Per em[place]os pt have Skyrizi  CC ends in 1960 Leave voicemail about Skyrizi  CC on 06/03/23 for pickup.

## 2023-06-08 DIAGNOSIS — N979 Female infertility, unspecified: Secondary | ICD-10-CM | POA: Diagnosis not present

## 2023-06-20 NOTE — Progress Notes (Signed)
 HPI: Follow-up palpitations. Echocardiogram February 2021 showed normal LV function.  Monitor July 2023 showed sinus rhythm with occasional PAC, PVC and 3 beats nonsustained ventricular tachycardia.  Monitor repeated July 2024 and showed sinus rhythm with rare PAC, 7 beats SVT and rare PVC.  Since last seen she has occasional palpitations unchanged.  They are described as a skip.  Occasional flutter.  Actually improved with bisoprolol .  There is no dyspnea, chest pain or syncope.  Current Outpatient Medications  Medication Sig Dispense Refill   acetaminophen  (TYLENOL ) 500 MG tablet Take 1 tablet (500 mg total) by mouth every 6 (six) hours as needed for fever. 30 tablet 0   albuterol  (VENTOLIN  HFA) 108 (90 Base) MCG/ACT inhaler Inhale 2 puffs into the lungs every 6 (six) hours as needed for wheezing or shortness of breath. 8 g 0   ALPRAZolam (XANAX) 0.25 MG tablet Take 0.25 mg by mouth 3 (three) times daily as needed.     bisoprolol  (ZEBETA ) 5 MG tablet Take 1 tablet (5 mg total) by mouth at bedtime. May take extra 1/2 tablet as needed for palpitations 135 tablet 3   buPROPion  (WELLBUTRIN  XL) 300 MG 24 hr tablet Take 1 tablet (300 mg total) by mouth daily. 30 tablet 1   cetirizine (ZYRTEC) 10 MG tablet Take by mouth.     fluticasone  (FLONASE ) 50 MCG/ACT nasal spray Place 2 sprays into both nostrils as needed. 16 g 3   hydrOXYzine  (ATARAX ) 10 MG tablet Take 1 tablet (10 mg total) by mouth 3 (three) times daily as needed for itching. 30 tablet 3   ibuprofen  (ADVIL ) 800 MG tablet Take 1 tablet (800 mg total) by mouth every 8 (eight) hours as needed for moderate pain (pain score 4-6) or fever. 30 tablet 0   ondansetron  (ZOFRAN -ODT) 4 MG disintegrating tablet Dissolve 1 tablet (4 mg total) in mouth every 8 (eight) hours as needed for nausea or vomiting. 20 tablet 0   risankizumab -rzaa (SKYRIZI  PEN) 150 MG/ML pen Inject 150 mg into the skin as directed. Every 12 weeks for maintenance. 1 mL 1   No  current facility-administered medications for this visit.     Past Medical History:  Diagnosis Date   Anxiety    Clostridium difficile infection    Depression    Psoriasis    Scoliosis     Past Surgical History:  Procedure Laterality Date   LAPAROSCOPIC APPENDECTOMY N/A 05/07/2019   Procedure: APPENDECTOMY LAPAROSCOPIC;  Surgeon: Evelyn Gander, MD;  Location: [facility] OR;  Service: General;  Laterality: N/A;   MOUTH SURGERY     28 year old molers removed   WISDOM TOOTH EXTRACTION  2014    Social History   Socioeconomic History   Marital status: Single    Spouse name: Not on file   Number of children: 0   Years of education: Not on file   Highest education level: Some college, no degree  Occupational History   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 [employer]    Occupation: Engineer, site  Tobacco Use   Smoking status: Never   Smokeless tobacco: Never  Vaping Use   Vaping status: Some Days   Substances: Nicotine, Flavoring  Substance and Sexual Activity   Alcohol use: Yes    Comment: Rare   Drug use: Not Currently   Sexual activity: Yes    Birth control/protection: Surgical    Comment: vasectomy -  Other Topics Concern   Not on file  Social History Narrative   Not on file   Social Drivers of Health   Financial Resource Strain: Low Risk  (02/08/2023)   Overall Financial Resource Strain (CARDIA)    Difficulty of Paying Living Expenses: Not hard at all  Food Insecurity: No Food Insecurity (02/08/2023)   Hunger Vital Sign    Worried About Running Out of Food in the Last Year: Never true    Ran Out of Food in the Last Year: Never true  Transportation Needs: No Transportation Needs (02/08/2023)   PRAPARE - Administrator, Civil Service (Medical): No    Lack of Transportation (Non-Medical): No  Physical Activity: Insufficiently Active (02/08/2023)   Exercise Vital Sign    Days of Exercise per Week: 2 days    Minutes of Exercise per Session:  10 min  Stress: No Stress Concern Present (02/08/2023)   [employer] of Occupational Health - Occupational Stress Questionnaire    Feeling of Stress : Only a little  Social Connections: Moderately Isolated (02/08/2023)   Social Connection and Isolation Panel    Frequency of Communication with Friends and Family: More than three times a week    Frequency of Social Gatherings with Friends and Family: Three times a week    Attends Religious Services: 1 to 4 times per year    Active Member of Clubs or Organizations: No    Attends Engineer, structural: Not on file    Marital Status: Never married  Catering manager Violence: Not on file    Family History  Problem Relation Age of Onset   Psoriasis Mother    Healthy Brother    Hypertension Maternal Grandmother    Hypertension Maternal Grandfather    Hypertension Paternal Grandmother    Hypertension Paternal Grandfather    Hypertension Other    Heart attack Other    Breast cancer Other    Colon cancer Neg Hx    Esophageal cancer Neg Hx    Pancreatic cancer Neg Hx    Stomach cancer Neg Hx    Rectal cancer Neg Hx     ROS: no fevers or chills, productive cough, hemoptysis, dysphasia, odynophagia, melena, hematochezia, dysuria, hematuria, rash, seizure activity, orthopnea, PND, pedal edema, claudication. Remaining systems are negative.  Physical Exam: Well-developed well-nourished in no acute distress.  Skin is warm and dry.  HEENT is normal.  Neck is supple.  Chest is clear to auscultation with normal expansion.  Cardiovascular exam is regular rate and rhythm.  Abdominal exam nontender or distended. No masses palpated. Extremities show no edema. neuro grossly intact  ECG-February 24, 2023-normal sinus rhythm with minimal nonspecific ST changes personally reviewed  A/P  1 palpitations-LV function normal on previous echocardiogram.  Previous monitors showed PACs, PVCs, 3 beats nonsustained VT and short run of SVT.   Continue beta-blocker.  She takes 5 mg of bisoprolol  at night.  She will continue to take an additional 2.5 mg as needed.  Symptoms are reasonably well-controlled.  Evelyn Angel, MD

## 2023-07-04 ENCOUNTER — Ambulatory Visit: Attending: Cardiology | Admitting: Cardiology

## 2023-07-04 ENCOUNTER — Other Ambulatory Visit (HOSPITAL_COMMUNITY): Payer: Self-pay

## 2023-07-04 ENCOUNTER — Encounter: Payer: Self-pay | Admitting: Cardiology

## 2023-07-04 ENCOUNTER — Other Ambulatory Visit: Payer: Self-pay

## 2023-07-04 VITALS — BP 100/60 | HR 80 | Ht 67.0 in | Wt 128.6 lb

## 2023-07-04 DIAGNOSIS — R002 Palpitations: Secondary | ICD-10-CM

## 2023-07-04 NOTE — Patient Instructions (Signed)
   Follow-Up: At [facility], you and your health needs are our priority.  As part of our continuing mission to provide you with exceptional heart care, our providers are all part of one team.  This team includes your primary Cardiologist (physician) and Advanced Practice Providers or APPs (Physician Assistants and Nurse Practitioners) who all work together to provide you with the care you need, when you need it.  Your next appointment:   6 month(s)  Provider:   Lawana Pray, NP      Then, Alexandria Angel, MD will plan to see you again in 12 month(s).

## 2023-07-04 NOTE — Progress Notes (Signed)
 Patient declined skyrizi  refill when she went to [facility] to pick up other medications, stating she does not need skyrizi  at this time. Medication has been at pharmacy since May. Daphne sending med back to [facility] and patient was advised if they need medication before next spec call they need to call us  directly.

## 2023-07-05 ENCOUNTER — Encounter: Payer: Self-pay | Admitting: Dermatology

## 2023-07-06 NOTE — Telephone Encounter (Signed)
 Looks like a non specific irration, she can try applying a cortisone cream twice a day for 2 weeks and if it doesn't resolve then come in for an appointment. - Dr. Myrtie Atkinson

## 2023-07-14 ENCOUNTER — Other Ambulatory Visit (HOSPITAL_COMMUNITY): Payer: Self-pay

## 2023-07-14 MED ORDER — DIAZEPAM 5 MG PO TABS
5.0000 mg | ORAL_TABLET | Freq: Two times a day (BID) | ORAL | 0 refills | Status: DC
  Filled 2023-07-14 – 2023-10-10 (×2): qty 2, 1d supply, fill #0

## 2023-07-21 ENCOUNTER — Other Ambulatory Visit (HOSPITAL_COMMUNITY): Payer: Self-pay

## 2023-07-21 ENCOUNTER — Other Ambulatory Visit: Payer: Self-pay

## 2023-07-21 NOTE — Progress Notes (Signed)
 Specialty Pharmacy Refill Coordination Note  Evelyn Goodwin is a 28 y.o. female contacted today regarding refills of specialty medication(s) Risankizumab -rzaa (Skyrizi  Pen)   Patient requested [name] at [facility] at [place] date: 07/25/23   Medication will be filled on 07/21/23.     LVM for pt who wanted to know cost, should bill to Skyrizi  CC on file at pickup.

## 2023-07-26 ENCOUNTER — Other Ambulatory Visit (HOSPITAL_COMMUNITY): Payer: Self-pay

## 2023-08-15 ENCOUNTER — Encounter: Payer: Self-pay | Admitting: Nurse Practitioner

## 2023-08-23 ENCOUNTER — Other Ambulatory Visit: Payer: Self-pay

## 2023-09-06 ENCOUNTER — Other Ambulatory Visit: Payer: Self-pay

## 2023-09-06 ENCOUNTER — Other Ambulatory Visit (HOSPITAL_COMMUNITY): Payer: Self-pay

## 2023-09-12 ENCOUNTER — Encounter: Payer: Self-pay | Admitting: Gastroenterology

## 2023-09-13 ENCOUNTER — Other Ambulatory Visit: Payer: Self-pay

## 2023-09-13 ENCOUNTER — Other Ambulatory Visit (HOSPITAL_COMMUNITY): Payer: Self-pay

## 2023-09-13 DIAGNOSIS — R1084 Generalized abdominal pain: Secondary | ICD-10-CM

## 2023-09-13 DIAGNOSIS — R1031 Right lower quadrant pain: Secondary | ICD-10-CM

## 2023-09-13 MED ORDER — DICYCLOMINE HCL 10 MG PO CAPS
10.0000 mg | ORAL_CAPSULE | Freq: Three times a day (TID) | ORAL | 1 refills | Status: DC
  Filled 2023-09-13: qty 30, 10d supply, fill #0

## 2023-09-13 MED ORDER — ONDANSETRON HCL 4 MG PO TABS
4.0000 mg | ORAL_TABLET | Freq: Three times a day (TID) | ORAL | 0 refills | Status: DC | PRN
  Filled 2023-09-13: qty 30, 10d supply, fill #0

## 2023-09-13 NOTE — Telephone Encounter (Signed)
 Linda/Dottie, pls contact patient and let her know Dr. Leigh is not working in the outpatient clinic this week and is working in the hospital therefore I am addressing her [employer]. Please schedule patient for an office visit with Dr. Leigh or POD A APP. Send her to our lab for a CBC, CMET and CRP. Patient to try Ibgard (peppermint oil) one capsule bid if she has not previously tried it. If Ibgard ineffective then she can try Dicyclomine  10mg  one tab po Q 8 hrs PRN # 30, 1 refill,ok to send RX. If she is having nausea pls send RX for Ondansetron  ODT 4mg  one tab dissolve on tongue Q 8 hrs PRN # 30, no refills.  Patient to go to the ED if she develops severe abdominal pain. THX.

## 2023-09-14 ENCOUNTER — Other Ambulatory Visit: Payer: Self-pay | Admitting: *Deleted

## 2023-09-14 DIAGNOSIS — R1084 Generalized abdominal pain: Secondary | ICD-10-CM

## 2023-09-14 DIAGNOSIS — R1031 Right lower quadrant pain: Secondary | ICD-10-CM

## 2023-09-14 NOTE — Addendum Note (Signed)
 Addended by: NICHOLAUS JARVIS on: 09/14/2023 08:56 AM   Modules accepted: Orders

## 2023-09-15 ENCOUNTER — Other Ambulatory Visit (HOSPITAL_COMMUNITY): Payer: Self-pay

## 2023-09-15 ENCOUNTER — Ambulatory Visit: Admitting: Physician Assistant

## 2023-09-15 ENCOUNTER — Ambulatory Visit: Payer: Self-pay | Admitting: Nurse Practitioner

## 2023-09-15 LAB — COMPREHENSIVE METABOLIC PANEL WITH GFR
ALT: 11 IU/L (ref 0–32)
AST: 15 IU/L (ref 0–40)
Albumin: 4.9 g/dL (ref 4.0–5.0)
Alkaline Phosphatase: 63 IU/L (ref 44–121)
BUN/Creatinine Ratio: 14 (ref 9–23)
BUN: 13 mg/dL (ref 6–20)
Bilirubin Total: 0.4 mg/dL (ref 0.0–1.2)
CO2: 23 mmol/L (ref 20–29)
Calcium: 10 mg/dL (ref 8.7–10.2)
Chloride: 101 mmol/L (ref 96–106)
Creatinine, Ser: 0.9 mg/dL (ref 0.57–1.00)
Globulin, Total: 2.8 g/dL (ref 1.5–4.5)
Glucose: 109 mg/dL — ABNORMAL HIGH (ref 70–99)
Potassium: 4.1 mmol/L (ref 3.5–5.2)
Sodium: 139 mmol/L (ref 134–144)
Total Protein: 7.7 g/dL (ref 6.0–8.5)
eGFR: 90 mL/min/1.73 (ref >59)

## 2023-09-15 LAB — CBC WITH DIFFERENTIAL/PLATELET
Basophils Absolute: 0.1 x10E3/uL (ref 0.0–0.2)
Basos: 1 %
EOS (ABSOLUTE): 0.1 x10E3/uL (ref 0.0–0.4)
Eos: 1 %
Hematocrit: 44.1 % (ref 34.0–46.6)
Hemoglobin: 14.7 g/dL (ref 11.1–15.9)
Immature Grans (Abs): 0 x10E3/uL (ref 0.0–0.1)
Immature Granulocytes: 0 %
Lymphocytes Absolute: 2.8 x10E3/uL (ref 0.7–3.1)
Lymphs: 30 %
MCH: 30.5 pg (ref 26.6–33.0)
MCHC: 33.3 g/dL (ref 31.5–35.7)
MCV: 92 fL (ref 79–97)
Monocytes Absolute: 0.8 x10E3/uL (ref 0.1–0.9)
Monocytes: 9 %
Neutrophils Absolute: 5.4 x10E3/uL (ref 1.4–7.0)
Neutrophils: 59 %
Platelets: 354 x10E3/uL (ref 150–450)
RBC: 4.82 x10E6/uL (ref 3.77–5.28)
RDW: 11.9 % (ref 11.7–15.4)
WBC: 9.1 x10E3/uL (ref 3.4–10.8)

## 2023-09-15 LAB — C-REACTIVE PROTEIN: CRP: <1 mg/L (ref 0–10)

## 2023-09-15 MED ORDER — DICYCLOMINE HCL 10 MG PO CAPS
10.0000 mg | ORAL_CAPSULE | Freq: Three times a day (TID) | ORAL | 1 refills | Status: DC
  Filled 2023-09-15 (×3): qty 30, 10d supply, fill #0

## 2023-09-15 MED ORDER — ONDANSETRON HCL 4 MG PO TABS
4.0000 mg | ORAL_TABLET | Freq: Three times a day (TID) | ORAL | 0 refills | Status: DC | PRN
  Filled 2023-09-15 (×3): qty 30, 10d supply, fill #0

## 2023-09-16 ENCOUNTER — Other Ambulatory Visit (HOSPITAL_COMMUNITY): Payer: Self-pay

## 2023-09-20 ENCOUNTER — Other Ambulatory Visit: Payer: Self-pay

## 2023-09-20 ENCOUNTER — Encounter: Payer: Self-pay | Admitting: Dermatology

## 2023-09-20 ENCOUNTER — Other Ambulatory Visit (HOSPITAL_COMMUNITY): Payer: Self-pay

## 2023-09-23 ENCOUNTER — Other Ambulatory Visit: Payer: Self-pay

## 2023-09-29 ENCOUNTER — Other Ambulatory Visit: Payer: Self-pay

## 2023-10-06 ENCOUNTER — Other Ambulatory Visit: Payer: Self-pay | Admitting: Family Medicine

## 2023-10-06 ENCOUNTER — Other Ambulatory Visit (HOSPITAL_COMMUNITY): Payer: Self-pay

## 2023-10-06 ENCOUNTER — Other Ambulatory Visit: Payer: Self-pay

## 2023-10-06 ENCOUNTER — Telehealth: Payer: Self-pay

## 2023-10-06 NOTE — Telephone Encounter (Signed)
 Pharmacy Patient Advocate Encounter   Received notification from Patient Pharmacy that prior authorization for Skyrizi  is required/requested.   Insurance verification completed.   The patient is insured through [facility] .   Per test claim: PA required; PA submitted to above mentioned insurance via Latent Key/confirmation #/EOC [id number] Status is pending

## 2023-10-07 ENCOUNTER — Other Ambulatory Visit: Payer: Self-pay | Admitting: Pharmacy Technician

## 2023-10-07 ENCOUNTER — Other Ambulatory Visit: Payer: Self-pay

## 2023-10-07 ENCOUNTER — Encounter (INDEPENDENT_AMBULATORY_CARE_PROVIDER_SITE_OTHER): Payer: Self-pay

## 2023-10-07 NOTE — Telephone Encounter (Signed)
 Pharmacy Patient Advocate Encounter  Received notification from [facility] that Prior Authorization for Skyrizi  has been APPROVED from 10/07/23 to 10/05/24   PA #/Case ID/Reference #: [id number]

## 2023-10-07 NOTE — Progress Notes (Signed)
 Specialty Pharmacy Refill Coordination Note  Evelyn Goodwin is a 28 y.o. female contacted today regarding refills of specialty medication(s) Risankizumab -rzaa (Skyrizi  Pen)   Patient requested [name] at [facility] at [place] date: 10/21/23   Medication will be filled on 10/20/23. Injection on 10/25/23. Answered questionnaire.

## 2023-10-10 ENCOUNTER — Other Ambulatory Visit: Payer: Self-pay | Admitting: Family Medicine

## 2023-10-10 ENCOUNTER — Other Ambulatory Visit: Payer: Self-pay

## 2023-10-10 ENCOUNTER — Other Ambulatory Visit (HOSPITAL_COMMUNITY): Payer: Self-pay

## 2023-10-11 ENCOUNTER — Other Ambulatory Visit: Payer: Self-pay

## 2023-10-13 ENCOUNTER — Other Ambulatory Visit (HOSPITAL_COMMUNITY): Payer: Self-pay

## 2023-10-17 ENCOUNTER — Other Ambulatory Visit (HOSPITAL_COMMUNITY): Payer: Self-pay

## 2023-10-17 ENCOUNTER — Ambulatory Visit (INDEPENDENT_AMBULATORY_CARE_PROVIDER_SITE_OTHER): Payer: Self-pay | Admitting: Nurse Practitioner

## 2023-10-17 DIAGNOSIS — Z01419 Encounter for gynecological examination (general) (routine) without abnormal findings: Secondary | ICD-10-CM | POA: Diagnosis not present

## 2023-10-17 DIAGNOSIS — Z6820 Body mass index (BMI) 20.0-20.9, adult: Secondary | ICD-10-CM | POA: Diagnosis not present

## 2023-10-17 DIAGNOSIS — Z124 Encounter for screening for malignant neoplasm of cervix: Secondary | ICD-10-CM | POA: Diagnosis not present

## 2023-10-17 DIAGNOSIS — Z8659 Personal history of other mental and behavioral disorders: Secondary | ICD-10-CM

## 2023-10-17 MED ORDER — LETROZOLE 2.5 MG PO TABS
2.5000 mg | ORAL_TABLET | Freq: Every day | ORAL | 0 refills | Status: DC
  Filled 2023-10-17: qty 5, 5d supply, fill #0

## 2023-10-17 NOTE — Progress Notes (Signed)
 This encounter was created in error - please disregard. Patient not seen , will reschedule appointment

## 2023-10-17 NOTE — Progress Notes (Deleted)
 This encounter was created in error - please disregard.

## 2023-10-19 ENCOUNTER — Other Ambulatory Visit (HOSPITAL_COMMUNITY): Payer: Self-pay

## 2023-10-20 ENCOUNTER — Other Ambulatory Visit (HOSPITAL_COMMUNITY): Payer: Self-pay

## 2023-10-20 ENCOUNTER — Other Ambulatory Visit: Payer: Self-pay

## 2023-10-21 ENCOUNTER — Other Ambulatory Visit (HOSPITAL_COMMUNITY): Payer: Self-pay

## 2023-10-22 ENCOUNTER — Other Ambulatory Visit (HOSPITAL_COMMUNITY): Payer: Self-pay

## 2023-10-28 ENCOUNTER — Ambulatory Visit: Payer: Self-pay
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
## 2023-10-31 ENCOUNTER — Other Ambulatory Visit (HOSPITAL_COMMUNITY): Payer: Self-pay

## 2023-10-31 MED ORDER — FLUZONE 0.5 ML IM SUSY
0.5000 mL | PREFILLED_SYRINGE | Freq: Once | INTRAMUSCULAR | 0 refills | Status: AC
  Filled 2023-10-31: qty 0.5, 1d supply, fill #0

## 2023-11-02 ENCOUNTER — Other Ambulatory Visit (HOSPITAL_COMMUNITY): Payer: Self-pay

## 2023-11-03 ENCOUNTER — Ambulatory Visit: Admitting: Gastroenterology

## 2023-11-03 ENCOUNTER — Encounter: Payer: Self-pay | Admitting: Gastroenterology

## 2023-11-03 ENCOUNTER — Other Ambulatory Visit (HOSPITAL_COMMUNITY): Payer: Self-pay

## 2023-11-03 ENCOUNTER — Telehealth: Payer: Self-pay

## 2023-11-03 DIAGNOSIS — R110 Nausea: Secondary | ICD-10-CM

## 2023-11-03 DIAGNOSIS — R1031 Right lower quadrant pain: Secondary | ICD-10-CM

## 2023-11-03 DIAGNOSIS — N979 Female infertility, unspecified: Secondary | ICD-10-CM | POA: Diagnosis not present

## 2023-11-03 DIAGNOSIS — R112 Nausea with vomiting, unspecified: Secondary | ICD-10-CM

## 2023-11-03 MED ORDER — ONDANSETRON 4 MG PO TBDP
4.0000 mg | ORAL_TABLET | Freq: Three times a day (TID) | ORAL | 1 refills | Status: DC | PRN
  Filled 2023-11-03: qty 30, 10d supply, fill #0
  Filled 2023-12-12: qty 30, 10d supply, fill #1

## 2023-11-03 NOTE — Patient Instructions (Signed)
 We have sent the following medications to your pharmacy for you to pick up at your convenience: Zofran  4 mg ODT every 8 hours as needed.  Mirtazapine or Amitriptyline with OBGYN for chronic nausea/abdominal.

## 2023-11-03 NOTE — Progress Notes (Signed)
 11/03/2023 Evelyn Goodwin [id number] Feb 10, 1995   HISTORY OF PRESENT ILLNESS: Discussed the use of AI scribe software for clinical note transcription with the patient, who gave verbal consent to proceed.  History of Present Illness Evelyn Goodwin is a 28 year old female with a history of complicated appendicitis course, C. difficile infection, post-infectious IBS who presents with abdominal pain and nausea.  She was previously patient of Dr. Eda, now her care has been assumed by Dr. Leigh.  She experiences persistent right lower quadrant abdominal pain located where her appendix used to be, which does not radiate. The pain is located where her appendix used to be. In August, she had one episode of symptoms with a sudden change in bowel movements from stage three to stage seven, accompanied by sweating and crying, which resolved by the next morning and has not recurred.  She experiences occasional nausea, often linked to poor sleep and sporadic coffee consumption at work. The nausea sometimes occurs after eating out with her boyfriend, either waking her up at night or appearing the next morning. She describes the nausea as a 'crampy' feeling lasting about an hour, and reports that after taking Zofran  as needed, her symptoms go away. No symptoms of acid reflux such as heartburn, indigestion, or regurgitation are present.  She has tried Bentyl  for her symptoms and abdominal pain, but she did not have any benefit from that.  Her past medical history includes a severe reaction to vancomycin  and recurrent C. difficile infections. She is currently taking Skyrizi  for psoriatic conditions and uses Zofran  for nausea, which she finds effective despite causing temporary constipation. She has previously tried Pepcid .  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 She shares meals with her boyfriend at restaurants, and sometimes feels unwell  after eating out.  She says that she is going to be trying to conceive for a baby soon in the near future.  Colonoscopy 02/2023:  - The examined portion of the ileum was normal. - Stool in the entire examined colon leading to lavage. - The examination was otherwise normal. No obvious inflammatory changes. - Biopsies were taken with a cold forceps for histology.  No overt evidence of IBD or concerning pathology on this exam to cause RLQ pain.   1. Surgical [P], random colon sites :       -  COLONIC MUCOSA WITH NO SIGNIFICANT PATHOLOGY.   Past Medical History:  Diagnosis Date   Anxiety    Clostridium difficile infection    Depression    Psoriasis    Scoliosis    Past Surgical History:  Procedure Laterality Date   LAPAROSCOPIC APPENDECTOMY N/A 05/07/2019   Procedure: APPENDECTOMY LAPAROSCOPIC;  Surgeon: Sebastian Moles, MD;  Location: [facility] OR;  Service: General;  Laterality: N/A;   MOUTH SURGERY     28 year old molers removed   WISDOM TOOTH EXTRACTION  2014    reports that she has never smoked. She has never used smokeless tobacco. She reports current alcohol use. She reports that she does not currently use drugs. family history includes Breast cancer in an other family member; Healthy in her brother; Heart attack in an other family member; Hypertension in her maternal grandfather, maternal grandmother, paternal grandfather, paternal grandmother, and another family member; Psoriasis in her mother. Allergies  Allergen Reactions   Vancomycin  Rash    Chest Pain   Diltiazem  Palpitations   Diphenhydramine  Hcl Other (See Comments)  Other Reaction(s): Psychosis  Sleep paralysis  ZQuil   Metoprolol  Other (See Comments)    Frequent urination.    Penicillin G Rash   Penicillins Rash   Vraylar  [Cariprazine ] Nausea And Vomiting      Outpatient Encounter Medications as of 11/03/2023  Medication Sig   acetaminophen  (TYLENOL ) 500 MG tablet Take 1 tablet (500 mg total) by mouth every 6  (six) hours as needed for fever.   albuterol  (VENTOLIN  HFA) 108 (90 Base) MCG/ACT inhaler Inhale 2 puffs into the lungs every 6 (six) hours as needed for wheezing or shortness of breath.   ALPRAZolam (XANAX) 0.25 MG tablet Take 0.25 mg by mouth 3 (three) times daily as needed.   bisoprolol  (ZEBETA ) 5 MG tablet Take 1 tablet (5 mg total) by mouth at bedtime. May take extra 1/2 tablet as needed for palpitations   buPROPion  (WELLBUTRIN  XL) 300 MG 24 hr tablet Take 1 tablet (300 mg total) by mouth daily.   cetirizine (ZYRTEC) 10 MG tablet Take by mouth.   diazepam  (VALIUM ) 5 MG tablet Take 1 tablet (5 mg total) by mouth in the evening before bedtime, then 1 tablet 1 hour prior to appointment.   fluticasone  (FLONASE ) 50 MCG/ACT nasal spray Place 2 sprays into both nostrils as needed.   hydrOXYzine  (ATARAX ) 10 MG tablet Take 1 tablet (10 mg total) by mouth 3 (three) times daily as needed for itching.   ibuprofen  (ADVIL ) 800 MG tablet Take 1 tablet (800 mg total) by mouth every 8 (eight) hours as needed for moderate pain (pain score 4-6) or fever.   letrozole (FEMARA) 2.5 MG tablet Take 1 tablet (2.5 mg total) by mouth daily on cycle days 3, 4, 5, 6, and 7.   ondansetron  (ZOFRAN -ODT) 4 MG disintegrating tablet Dissolve 1 tablet (4 mg total) in mouth every 8 (eight) hours as needed for nausea or vomiting.   risankizumab -rzaa (SKYRIZI  PEN) 150 MG/ML pen Inject 150 mg into the skin as directed. Every 12 weeks for maintenance.   [DISCONTINUED] dicyclomine  (BENTYL ) 10 MG capsule Take 1 capsule (10 mg total) by mouth 3 (three) times daily before meals.   [DISCONTINUED] ondansetron  (ZOFRAN ) 4 MG tablet Take 1 tablet (4 mg total) by mouth every 8 (eight) hours as needed for nausea or vomiting.   No facility-administered encounter medications on file as of 11/03/2023.    REVIEW OF SYSTEMS  : All other systems reviewed and negative except where noted in the History of Present Illness.   PHYSICAL EXAM: Pulse 75    Ht 5' 7 (1.702 m)   Wt 126 lb 4 oz (57.3 kg)   SpO2 97%   BMI 19.77 kg/m  General: Well developed white female in no acute distress Head: Normocephalic and atraumatic Eyes:  Sclerae anicteric, conjunctiva pink. Ears: Normal auditory acuity Lungs: Clear throughout to auscultation; no W/R/R. Heart: Regular rate and rhythm; no M/R/G. Abdomen: Soft, non-distended.  BS present.  Non-tender. Musculoskeletal: Symmetrical with no gross deformities  Skin: No lesions on visible extremities Extremities: No edema  Neurological: Alert oriented x 4, grossly non-focal Psychological:  Alert and cooperative. Normal mood and affect  Assessment & Plan Chronic abdominal pain and nausea Intermittent right lower quadrant pain, possibly related to scar tissue or nerve pain. Nausea occurs sporadically, potentially related to dietary factors or stress. Differential includes gallbladder dysfunction, but no evidence of gallstones or cholecystitis. Consideration of functional nausea and abdominal pain. Zofran  provides temporary relief for nausea. - Refill Zofran  (disintegrating tablets) for nausea as  needed. - Consider trial of amitriptyline or mirtazapine at bedtime for chronic nausea and abdominal pain. - Discuss with OB regarding amitriptyline or mirtazapine regarding use during pregnancy as she is going to try to get pregnant in the near future. - Consider HIDA scan to evaluate gallbladder function if symptoms persist. - Consider trial of Prilosec  for 4-6 weeks if symptoms suggestive of acid reflux develop.  Psoriatic disease on Skyrizi  Psoriatic disease managed with Skyrizi , which is effective for her condition. No current issues reported with psoriatic disease management.     CC:  No ref. provider found

## 2023-11-03 NOTE — Telephone Encounter (Signed)
 Copied from CRM [id number]. Topic: General - Other >> Nov 03, 2023  3:45 PM Deaijah H wrote: Reason for CRM: Patient called in to advise nurse that eye doctor stated everything came back normal and will be coming to appointment next week

## 2023-11-03 NOTE — Progress Notes (Signed)
 Agree with assessment and plan as outlined.

## 2023-11-11 ENCOUNTER — Encounter: Payer: Self-pay | Admitting: Nurse Practitioner

## 2023-11-11 ENCOUNTER — Ambulatory Visit: Payer: Self-pay | Admitting: Nurse Practitioner

## 2023-11-11 ENCOUNTER — Other Ambulatory Visit (HOSPITAL_COMMUNITY): Payer: Self-pay

## 2023-11-11 VITALS — BP 116/72 | HR 98 | Temp 96.8°F | Wt 128.0 lb

## 2023-11-11 DIAGNOSIS — G44319 Acute post-traumatic headache, not intractable: Secondary | ICD-10-CM

## 2023-11-11 DIAGNOSIS — J309 Allergic rhinitis, unspecified: Secondary | ICD-10-CM

## 2023-11-11 DIAGNOSIS — L4050 Arthropathic psoriasis, unspecified: Secondary | ICD-10-CM

## 2023-11-11 DIAGNOSIS — F322 Major depressive disorder, single episode, severe without psychotic features: Secondary | ICD-10-CM

## 2023-11-11 DIAGNOSIS — F419 Anxiety disorder, unspecified: Secondary | ICD-10-CM | POA: Diagnosis not present

## 2023-11-11 DIAGNOSIS — G44309 Post-traumatic headache, unspecified, not intractable: Secondary | ICD-10-CM | POA: Insufficient documentation

## 2023-11-11 DIAGNOSIS — N979 Female infertility, unspecified: Secondary | ICD-10-CM | POA: Diagnosis not present

## 2023-11-11 DIAGNOSIS — R112 Nausea with vomiting, unspecified: Secondary | ICD-10-CM | POA: Diagnosis not present

## 2023-11-11 DIAGNOSIS — F411 Generalized anxiety disorder: Secondary | ICD-10-CM | POA: Diagnosis not present

## 2023-11-11 MED ORDER — BUPROPION HCL ER (XL) 300 MG PO TB24
300.0000 mg | ORAL_TABLET | Freq: Every day | ORAL | 1 refills | Status: DC
  Filled 2023-11-11: qty 30, 30d supply, fill #0

## 2023-11-11 NOTE — Assessment & Plan Note (Signed)
    11/11/2023   10:58 AM 10/17/2023    2:04 PM 09/14/2022    3:39 PM 06/25/2022   10:30 AM  GAD 7 : Generalized Anxiety Score  Nervous, Anxious, on Edge 1 0 2 1  Control/stop worrying 0 1 2 1   Worry too much - different things 1 1 2 1   Trouble relaxing 3 1 3 1   Restless 0 0 1 0  Easily annoyed or irritable 0 0 0 0  Afraid - awful might happen 0 1 2 2   Total GAD 7 Score 5 4 12 6   Anxiety Difficulty Not difficult at all Not difficult at all  Not difficult at all  Patient referred to psychiatrist Continue Wellbutrin  300 mg daily for depression, hydroxyzine  10 mg 3 times daily as needed anxiety

## 2023-11-11 NOTE — Assessment & Plan Note (Addendum)
 Evelyn Goodwin

## 2023-11-11 NOTE — Assessment & Plan Note (Signed)
 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 Continue Zofran  4 mg every 8 hours as needed Followed by GI
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
## 2023-11-11 NOTE — Assessment & Plan Note (Signed)
 Chronic psoriasis with joint aches post-Skyrizi  injection. Considering switch to Humira. - Discuss with dermatologist the possibility of switching from Skyrizi  to Humira.

## 2023-11-11 NOTE — Assessment & Plan Note (Signed)
 Allergic Rhinitis Seasonal allergic rhinitis managed with cetirizine and Flonase .

## 2023-11-11 NOTE — Assessment & Plan Note (Signed)
    11/11/2023   10:56 AM 10/17/2023    2:02 PM 09/14/2022    3:38 PM  Depression screen PHQ 2/9  Decreased Interest 3 3 3   Down, Depressed, Hopeless 3 3 3   PHQ - 2 Score 6 6 6   Altered sleeping 1 2 3   Tired, decreased energy 2 3 3   Change in appetite 1 3 3   Feeling bad or failure about yourself  3 3 3   Trouble concentrating 0 1 1  Moving slowly or fidgety/restless 0 0 1  Suicidal thoughts 0 0 2  PHQ-9 Score 13 18 22   Difficult doing work/chores Somewhat difficult Not difficult at all    Depression and Anxiety Chronic depression and anxiety exacerbated by stress and environmental factors. Limited success with multiple medications. Concern about potential postpartum depression. No self-harm thoughts. - Refer to psychiatrist for further evaluation and management. - Continue Wellbutrin  300 mg daily. - Discuss potential postpartum depression with psychiatrist.

## 2023-11-11 NOTE — Patient Instructions (Signed)
 1. Current severe episode of major depressive disorder without psychotic features, unspecified whether recurrent (HCC) (Primary)  - buPROPion  (WELLBUTRIN  XL) 300 MG 24 hr tablet; Take 1 tablet (300 mg total) by mouth daily.  Dispense: 30 tablet; Refill: 1 - Ambulatory referral to Psychiatry  2. Anxiety  - Ambulatory referral to Psychiatry   It is important that you exercise regularly at least 30 minutes 5 times a week as tolerated  Think about what you will eat, plan ahead. Choose  clean, green, fresh or frozen over canned, processed or packaged foods which are more sugary, salty and fatty. 70 to 75% of food eaten should be vegetables and fruit. Three meals at set times with snacks allowed between meals, but they must be fruit or vegetables. Aim to eat over a 12 hour period , example 7 am to 7 pm, and STOP after  your last meal of the day. Drink water,generally about 64 ounces per day, no other drink is as healthy. Fruit juice is best enjoyed in a healthy way, by EATING the fruit.  Thanks for choosing [facility] we consider it a privelige to serve you.

## 2023-11-15 ENCOUNTER — Other Ambulatory Visit (HOSPITAL_COMMUNITY): Payer: Self-pay

## 2023-11-15 MED ORDER — LETROZOLE 2.5 MG PO TABS
2.5000 mg | ORAL_TABLET | ORAL | 0 refills | Status: DC
  Filled 2023-11-15: qty 5, 5d supply, fill #0

## 2023-11-22 ENCOUNTER — Other Ambulatory Visit (HOSPITAL_COMMUNITY): Payer: Self-pay

## 2023-11-22 ENCOUNTER — Other Ambulatory Visit: Payer: Self-pay | Admitting: Cardiology

## 2023-11-22 DIAGNOSIS — R002 Palpitations: Secondary | ICD-10-CM

## 2023-11-23 ENCOUNTER — Other Ambulatory Visit (HOSPITAL_COMMUNITY): Payer: Self-pay

## 2023-11-23 MED ORDER — BISOPROLOL FUMARATE 5 MG PO TABS
5.0000 mg | ORAL_TABLET | Freq: Every day | ORAL | 2 refills | Status: DC
  Filled 2023-11-23: qty 45, 30d supply, fill #0

## 2023-11-30 ENCOUNTER — Other Ambulatory Visit: Payer: Self-pay

## 2023-11-30 NOTE — Progress Notes (Signed)
 Specialty Pharmacy Ongoing Clinical Assessment Note  Evelyn Goodwin is a 28 y.o. female who is being followed by the specialty pharmacy service for RxSp Psoriatic Arthritis   Patient's specialty medication(s) reviewed today: Risankizumab -rzaa (Skyrizi  Pen)   Missed doses in the last 4 weeks: 0   Patient/Caregiver asked additional questions regarding Skyrizi  in pregnancy. She is currently trying to conceive. Advised that fetal risk cannot be ruled out as there is not sufficient study data at this time. Informed her that this medication does cross the placenta but is generally considered safe and that manufacturer requests pregnant women use their registry system for monitoring.  Therapeutic benefit summary: Patient is achieving benefit (patient achieving benefit but symptoms return about 1.5 months after injection. she will be seeing her provider in February to discuss)   Adverse events/side effects summary: No adverse events/side effects   Patient's therapy is appropriate to: Continue    Goals Addressed             This Visit's Progress    Minimize recurrence of flares       Patient is achieving benefit but symptoms return about 1.5 months after injection. She will be seeing her provider in February to discuss potential change in therapy.          Follow up: 12 months  Delon CHRISTELLA Brow Specialty Pharmacist

## 2023-12-01 DIAGNOSIS — N979 Female infertility, unspecified: Secondary | ICD-10-CM | POA: Diagnosis not present

## 2023-12-05 ENCOUNTER — Other Ambulatory Visit (HOSPITAL_COMMUNITY): Payer: Self-pay

## 2023-12-12 ENCOUNTER — Other Ambulatory Visit (HOSPITAL_COMMUNITY): Payer: Self-pay

## 2023-12-12 ENCOUNTER — Other Ambulatory Visit: Payer: Self-pay

## 2023-12-12 DIAGNOSIS — R112 Nausea with vomiting, unspecified: Secondary | ICD-10-CM

## 2023-12-12 MED ORDER — ONDANSETRON 4 MG PO TBDP
4.0000 mg | ORAL_TABLET | Freq: Three times a day (TID) | ORAL | 6 refills | Status: AC | PRN
  Filled 2023-12-12 – 2024-01-23 (×3): qty 30, 10d supply, fill #0

## 2023-12-20 NOTE — Progress Notes (Unsigned)
 Cardiology Clinic Note   Patient Name: Evelyn Goodwin Date of Encounter: 12/22/2023  Primary Care Provider:  Paseda, Folashade R, FNP Primary Cardiologist:  Redell Shallow, MD  Patient Profile    Evelyn Goodwin 28 year old female presents to the clinic today for follow-up evaluation of her palpitations.  Past Medical History    Past Medical History:  Diagnosis Date   Anxiety    Clostridium difficile infection    Depression    Psoriasis    Scoliosis    Past Surgical History:  Procedure Laterality Date   LAPAROSCOPIC APPENDECTOMY N/A 05/07/2019   Procedure: APPENDECTOMY LAPAROSCOPIC;  Surgeon: Sebastian Moles, MD;  Location: [facility] OR;  Service: General;  Laterality: N/A;   MOUTH SURGERY     28 year old molers removed   WISDOM TOOTH EXTRACTION  2014    Allergies  Allergies  Allergen Reactions   Vancomycin  Rash    Chest Pain   Diltiazem  Palpitations   Diphenhydramine  Hcl Other (See Comments)    Other Reaction(s): Psychosis  Sleep paralysis  ZQuil   Metoprolol  Other (See Comments)    Frequent urination.    Penicillin G Rash   Penicillins Rash   Vraylar  [Cariprazine ] Nausea And Vomiting    History of Present Illness    Evelyn Goodwin has a PMH of depression, palpitations, anxiety, and NSVT.  She has a family history of hypertension in her maternal grandparents and reports that her father had a coronary calcium score of 7.  Her paternal grandfather had coronary artery disease and passed with MI.  She was seen and evaluated by Dr. Shallow in 2021.  She complained of palpitations.  She wore a cardiac event monitor 3/21 which showed PVCs, and 3 beats of NSVT.  Her echocardiogram 2/21 showed an EF of 60 to 65%, no regional wall motion abnormalities and no valvular abnormalities.  She was seen in the clinic by Damien Braver NP-C on 08/26/2021.  During that time she reported increased palpitations.  She indicated that it felt like it felt like the bottom portion  of shaking.  She had never started taking metoprolol .  Her cardiac event monitor was repeated and showed occasional PACs, PVCs, and 3 beats of NSVT.  She indicated that she would mainly notice her palpitations at night.  She felt that the metoprolol  was not effective in controlling her palpitations and she asked for alternative medication.  She also indicated that she had not started taking her Wellbutrin  because she had been told by her PCP that it could affect her blood pressure and heart rate with taking metoprolol .  She was started on diltiazem .  She contacted nurse triage line on 08/17/2021.  She indicated that she had been taking diltiazem  every night for 1 week.  She would wake up with a quivering sensation.  She was unable to take diltiazem  in the morning because it makes her sleepy.  She was instructed to discontinue the medication and start propranolol  10 mg 4 times daily as needed.  She presented to the clinic 11/27/21 for follow-up evaluation stated she continued to notice brief intermittent episodes of palpitations.  She noticed palpitations at random times.  She noted that with taking diltiazem  she would have shaking type sensation in the morning with teeth chattering.  She discontinued the medication and symptoms stopped.  We reviewed her previous echocardiogram and cardiac event monitors.  She expressed understanding.  She was working at [employer] doing 8-hour shifts.  She indicated that she had  not been hydrating well and did not have a formal exercise routine.  We reviewed option for propranolol  and importance of increasing her physical activity as well as maintaining p.o. hydration.  We reviewed her labs from September.   I  planned follow-up in 6 months.  She was seen in follow-up by Dr. Pietro 05/31/2022.  She was started on metoprolol  succinate 25 mg at bedtime.  Her medication was changed to bisoprolol  due to being unable to tolerate metoprolol .  She was seen in follow-up by  Lamarr Satterfield, NP on 09/03/2022.  During that time she continued to have palpitations and feeling of heart racing at times.  Her symptoms were associated with anxiety and stress.  She reported recent traumatic episodes which caused her to have significant depression and anxiety.  She was following with psychotherapy.  Her bisoprolol  was continued.  Her TSH was drawn by her PCP and was noted to be normal.  She was instructed to avoid caffeine, stimulants, and vaping.  She presented to the clinic 02/24/23 for follow-up evaluation and stated her bisoprolol  was controlling her palpitations fairly well.  She had not needed to take any extra half doses of the medication.  She did note with periods of anxiety her heart rate would increase.  She also noticed occasional episodes of palpitations that lasted for about 45 seconds usually in the evening and then would dissipate without intervention.  She continued to work to avoid triggers and has started working out at [employer]  We reviewed her previous echocardiogram and she expressed understanding.  She reported that she did not have as much stress at that time due to her grandmother being discharged from the hospital and did note that her uncle passed away in 12-27-23.  She had recent elevated heart rate with norovirus infection and viral URI.  She was recovering well.  She did have a colonoscopy planned for the next Friday for evaluation of epigastric type pain.  Follow-up in 12 months was planned.   She was seen in follow-up by Dr. Pietro on 07/04/2023.  During that time her occasional palpitations were unchanged.  She described episodes of palpitations as a skip and occasional flutter.  These had improved with bisoprolol .  She denied dyspnea chest pain and syncope.  She presents to the clinic today for follow-up evaluation and states she continues to notice brief palpitations.  We reviewed her previous clinic visits and medication.  She expressed understanding.   She notes that her palpitations will happen for minutes and then dissipate.  She is tolerating bisoprolol  well.  I will have her continue her heart healthy diet, maintain her physical activity, continue to avoid triggers and plan follow-up in 9 to 12 months.  We will refill her bisoprolol .  Today she denies chest pain, shortness of breath, lower extremity edema, fatigue, increased palpitations, melena, hematuria, hemoptysis, diaphoresis, weakness, presyncope, syncope, orthopnea, and PND.     Home Medications    Prior to Admission medications   Medication Sig Start Date End Date Taking? Authorizing Provider  ALPRAZolam (XANAX) 0.25 MG tablet Take 0.25 mg by mouth 3 (three) times daily as needed. 03/24/21   [provider]  buPROPion  (WELLBUTRIN  XL) 300 MG 24 hr tablet Take 1 tablet by mouth daily. 10/06/20   [provider]  dicyclomine  (BENTYL ) 20 MG tablet Take 1 tablet (20 mg total) by mouth in the morning, at noon, in the evening, and at bedtime. 08/26/21   Eda Iha, MD  diltiazem  (CARDIZEM  CD)  240 MG 24 hr capsule Take 1 capsule (240 mg total) by mouth daily. 10/19/21   Daneen Damien BROCKS, NP  diphenhydrAMINE  HCl, Sleep, (ZZZQUIL PO) Take by mouth. Take at night    [provider]  Risankizumab -rzaa (SKYRIZI  PEN) 150 MG/ML SOAJ Inject 150 mg into the skin as directed. Every 12 weeks for maintenance. 04/29/21   Jegede, Olugbemiga E, MD    Family History    Family History  Problem Relation Age of Onset   Psoriasis Mother    Depression Mother    Healthy Brother    Hypertension Maternal Grandmother    Hypertension Maternal Grandfather    Hypertension Paternal Grandmother    Hypertension Paternal Grandfather    Hypertension Other    Heart attack Other    Breast cancer Other    Colon cancer Neg Hx    Esophageal cancer Neg Hx    Pancreatic cancer Neg Hx    Stomach cancer Neg Hx    Rectal cancer Neg Hx    She indicated that her mother is alive. She  indicated that her father is alive. She indicated that her brother is alive. She indicated that the status of her maternal grandmother is unknown. She indicated that the status of her maternal grandfather is unknown. She indicated that the status of her paternal grandmother is unknown. She indicated that the status of her paternal grandfather is unknown. She indicated that the status of her neg hx is unknown. She indicated that the status of her other is unknown.  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 [employer]    Occupation: engineer, site  Tobacco Use   Smoking status: Never   Smokeless tobacco: Never  Vaping Use   Vaping status: Some Days   Substances: Nicotine, Flavoring  Substance and Sexual Activity   Alcohol use: Yes    Comment: Rare   Drug use: Not Currently   Sexual activity: Yes  Other Topics Concern   Not on file  Social History Narrative   Lives home alone    Social Drivers of Health   Financial Resource Strain: Low Risk  (11/07/2023)   Overall Financial Resource Strain (CARDIA)    Difficulty of Paying Living Expenses: Not hard at all  Food Insecurity: No Food Insecurity (11/07/2023)   Hunger Vital Sign    Worried About Running Out of Food in the Last Year: Never true    Ran Out of Food in the Last Year: Never true  Transportation Needs: No Transportation Needs (11/07/2023)   PRAPARE - Administrator, Civil Service (Medical): No    Lack of Transportation (Non-Medical): No  Physical Activity: Unknown (10/17/2023)   Exercise Vital Sign    Days of Exercise per Week: 4 days    Minutes of Exercise per Session: Not on file  Stress: No Stress Concern Present (11/07/2023)   [employer] of  Occupational Health - Occupational Stress Questionnaire    Feeling of Stress: Only a little  Recent Concern: Stress - Stress Concern Present (10/17/2023)   [employer] of Occupational Health - Occupational Stress Questionnaire    Feeling of Stress: To some extent  Social Connections: Moderately Isolated (  11/07/2023)   Social Connection and Isolation Panel    Frequency of Communication with Friends and Family: More than three times a week    Frequency of Social Gatherings with Friends and Family: More than three times a week    Attends Religious Services: 1 to 4 times per year    Active Member of [employer] or Organizations: No    Attends Engineer, Structural: Not on file    Marital Status: Never married  Intimate Partner Violence: Not on file     Review of Systems    General:  No chills, fever, night sweats or weight changes.  Cardiovascular:  No chest pain, dyspnea on exertion, edema, orthopnea, palpitations, paroxysmal nocturnal dyspnea. Dermatological: No rash, lesions/masses Respiratory: No cough, dyspnea Urologic: No hematuria, dysuria Abdominal:   No nausea, vomiting, diarrhea, bright red blood per rectum, melena, or hematemesis Neurologic:  No visual changes, wkns, changes in mental status. All other systems reviewed and are otherwise negative except as noted above.  Physical Exam    VS:  BP 110/72 (BP Location: Left Arm, Patient Position: Sitting, Cuff Size: Normal)   Pulse 81   Ht 5' 7 (1.702 m)   Wt 131 lb (59.4 kg)   BMI 20.52 kg/m  , BMI Body mass index is 20.52 kg/m. GEN: Well nourished, well developed, in no acute distress. HEENT: normal. Neck: Supple, no JVD, carotid bruits, or masses. Cardiac: RRR, no murmurs, rubs, or gallops. No clubbing, cyanosis, edema.  Radials/DP/PT 2+ and equal bilaterally.  Respiratory:  Respirations regular and unlabored, clear to auscultation bilaterally. GI: Soft, nontender, nondistended, BS + x 4. MS: no deformity or  atrophy. Skin: warm and dry, no rash. Neuro:  Strength and sensation are intact. Psych: Normal affect.  Accessory Clinical Findings    Recent Labs: 09/14/2023: ALT 11; BUN 13; Creatinine, Ser 0.90; Hemoglobin 14.7; Platelets 354; Potassium 4.1; Sodium 139   Recent Lipid Panel    Component Value Date/Time   CHOL 107 09/22/2022 1018   TRIG 75 09/22/2022 1018   HDL 47 09/22/2022 1018   CHOLHDL 2.3 09/22/2022 1018   LDLCALC 45 09/22/2022 1018         ECG personally reviewed by me today-EKG Interpretation Date/Time:  Thursday December 22 2023 11:09:31 EST Ventricular Rate:  81 PR Interval:  138 QRS Duration:  74 QT Interval:  374 QTC Calculation: 434 R Axis:   81  Text Interpretation: Normal sinus rhythm Normal ECG When compared with ECG of 24-Feb-2023 15:06, Nonspecific T wave abnormality no longer evident in Inferior leads Confirmed by Emelia Hazy [phone]) on 12/22/2023 11:19:56 AM   Echocardiogram 02/19/2019 IMPRESSIONS     1. The average left ventricular global longitudinal strain is -23.6 %.   2. Left ventricular ejection fraction, by visual estimation, is 60 to  65%. The left ventricle has normal function. There is no left ventricular  hypertrophy.   3. The left ventricle has no regional wall motion abnormalities.   4. Global right ventricle has normal systolic function.The right  ventricular size is normal. No increase in right ventricular wall  thickness.   5. Left atrial size was normal.   6. Right atrial size was normal.   7. The mitral valve is normal in structure. No evidence of mitral valve  regurgitation. No evidence of mitral stenosis.   8. The tricuspid valve is normal in structure.   9. The tricuspid valve is normal in structure. Tricuspid valve  regurgitation is trivial.  10. The aortic  valve is normal in structure. Aortic valve regurgitation is  not visualized. No evidence of aortic valve sclerosis or stenosis.  11. The pulmonic valve was normal in  structure. Pulmonic valve  regurgitation is not visualized.  12. Normal pulmonary artery systolic pressure.  13. The inferior vena cava is normal in size with greater than 50%  respiratory variability, suggesting right atrial pressure of 3 mmHg.   Cardiac event monitor 08/07/2021  Patch Wear Time:  12 days and 11 hours (2023-06-23T08:43:47-0400 to 2023-07-05T20:09:58-0400)   Patient had a min HR of 44 bpm, max HR of 150 bpm, and avg HR of 76 bpm. Predominant underlying rhythm was Sinus Rhythm. Isolated SVEs were rare (<1.0%), SVE Couplets were rare (<1.0%), and SVE Triplets were rare (<1.0%). Isolated VEs were rare (<1.0%,  784), VE Couplets were rare (<1.0%, 5), and VE Triplets were rare (<1.0%, 1).      Sinus bradycardia, NSR, sinus tachycardia, occasional PAC, PVC and 3 beats NSVT  Redell Shallow  Cardiac event monitor 08/16/2022  Patch Wear Time:  6 days and 15 hours (2024-07-12T20:32:07-398 to 2024-07-19T12:16:14-0400)   Patient had a min HR of 43 bpm, max HR of 160 bpm, and avg HR of 75 bpm. Predominant underlying rhythm was Sinus Rhythm. 1 run of SVT occurred lasting 7 beats with a max rate of 112 bpm (avg 105 bpm). Isolated SVEs were rare (<1.0%),  and no SVE Couplets or SVE Triplets were present. Isolated VEs were rare (<1.0%), VE Couplets were rare (<1.0%), and no VE Triplets were present.    Sinus bradycardia, NSR, sinus tachycardia, rare PAC, 7 beats SVT, rare PVC. Redell Shallow  Assessment & Plan   1.  Palpitations, NSVT-heart rate today 81 BPM.  She previously did not tolerate metoprolol .  She was transitioned to diltiazem  which she did not tolerate.    Was then transition to propranolol , retrialed metoprolol  and was then placed on bisoprolol .  Her prior cardiac event monitors showed a minimum heart rate 44-43 bpm, maximum heart rate 150-160, average heart rate 76-75 bpm, predominantly normal sinus rhythm, details above.   Maintain p.o. hydration-reviewed Maintain  physical activity Avoid caffeine, chocolate, EtOH, dehydration etc.-again, reviewed Continue bisoprolol , may take an additional 2.5 mg as needed    Disposition: Follow-up with Dr. Shallow or me in 9-12 months.   Josefa HERO. Monasia Lair NP-C     12/22/2023, 11:38 AM [facility] [address] Office [phone] Fax [phone]    I spent 14 minutes examining this patient, reviewing medications, and using patient centered shared decision making involving her cardiac care.  I spent  20 minutes reviewing her past medical history,  medications, and prior cardiac tests.

## 2023-12-21 ENCOUNTER — Other Ambulatory Visit (HOSPITAL_COMMUNITY): Payer: Self-pay

## 2023-12-21 MED ORDER — LETROZOLE 2.5 MG PO TABS
2.5000 mg | ORAL_TABLET | Freq: Every day | ORAL | 0 refills | Status: DC
  Filled 2023-12-21: qty 5, 5d supply, fill #0

## 2023-12-22 ENCOUNTER — Encounter: Payer: Self-pay | Admitting: General Practice

## 2023-12-22 ENCOUNTER — Ambulatory Visit: Attending: General Practice | Admitting: General Practice

## 2023-12-22 ENCOUNTER — Other Ambulatory Visit (HOSPITAL_COMMUNITY): Payer: Self-pay

## 2023-12-22 VITALS — BP 110/72 | HR 81 | Ht 67.0 in | Wt 131.0 lb

## 2023-12-22 DIAGNOSIS — I4729 Other ventricular tachycardia: Secondary | ICD-10-CM

## 2023-12-22 DIAGNOSIS — R002 Palpitations: Secondary | ICD-10-CM | POA: Diagnosis not present

## 2023-12-22 MED ORDER — BISOPROLOL FUMARATE 5 MG PO TABS
ORAL_TABLET | ORAL | 2 refills | Status: AC
  Filled 2023-12-22: qty 57, 38d supply, fill #0
  Filled 2023-12-22: qty 78, 52d supply, fill #0
  Filled 2023-12-22: qty 57, 38d supply, fill #0
  Filled 2023-12-22: qty 78, 52d supply, fill #0

## 2023-12-22 NOTE — Patient Instructions (Addendum)
 Medication Instructions:  NO CHANGES  Lab Work: NONE TO BE DONE TODAY.  Testing/Procedures: NONE  Follow-Up: At [facility], you and your health needs are our priority.  As part of our continuing mission to provide you with exceptional heart care, our providers are all part of one team.  This team includes your primary Cardiologist (physician) and Advanced Practice Providers or APPs (Physician Assistants and Nurse Practitioners) who all work together to provide you with the care you need, when you need it.  Your next appointment:   9-12 MONTHS  Provider:   Redell Shallow, MD    Other Instructions: PLEASE BE SURE TO CONTINUE TO AVOID TRIGGERS LIKE COFFEE, CAFFEINE, ECT.. CONTINUE TO STAY HYDRATED. CONTINUE PHYSICAL ACTIVITY.

## 2024-01-02 ENCOUNTER — Other Ambulatory Visit (HOSPITAL_COMMUNITY): Payer: Self-pay

## 2024-01-03 ENCOUNTER — Other Ambulatory Visit (HOSPITAL_COMMUNITY): Payer: Self-pay

## 2024-01-05 ENCOUNTER — Telehealth (HOSPITAL_COMMUNITY): Payer: Self-pay | Admitting: Psychiatry

## 2024-01-06 ENCOUNTER — Other Ambulatory Visit (HOSPITAL_COMMUNITY): Payer: Self-pay

## 2024-01-06 ENCOUNTER — Other Ambulatory Visit: Payer: Self-pay | Admitting: Dermatology

## 2024-01-08 MED ORDER — SKYRIZI PEN 150 MG/ML ~~LOC~~ SOAJ
150.0000 mg | SUBCUTANEOUS | 1 refills | Status: DC
  Filled 2024-01-09 (×2): qty 1, 84d supply, fill #0

## 2024-01-09 ENCOUNTER — Other Ambulatory Visit: Payer: Self-pay | Admitting: Pharmacist

## 2024-01-09 ENCOUNTER — Other Ambulatory Visit: Payer: Self-pay

## 2024-01-09 ENCOUNTER — Other Ambulatory Visit (HOSPITAL_COMMUNITY): Payer: Self-pay

## 2024-01-09 MED ORDER — SKYRIZI PEN 150 MG/ML ~~LOC~~ SOAJ
150.0000 mg | SUBCUTANEOUS | 1 refills | Status: AC
  Filled 2024-01-09: qty 1, 84d supply, fill #0

## 2024-01-09 NOTE — Progress Notes (Signed)
 Specialty Pharmacy Refill Coordination Note  Evelyn Goodwin is a 28 y.o. female contacted today regarding refills of specialty medication(s) Risankizumab -rzaa (Skyrizi  Pen)   Patient requested [name] at [facility] at [place] date: 01/10/24   Medication will be filled on: 01/09/24

## 2024-01-09 NOTE — Progress Notes (Signed)
 Clinical Intervention Note  Clinical Intervention Notes: Patient reported starting letrozole . No DDIs identified with Skyrizi    Clinical Intervention Outcomes: Prevention of an adverse drug event   Advertising Account Planner

## 2024-01-13 ENCOUNTER — Other Ambulatory Visit: Payer: Self-pay

## 2024-01-17 ENCOUNTER — Ambulatory Visit (HOSPITAL_COMMUNITY)
Admission: EM | Admit: 2024-01-17 | Discharge: 2024-01-17 | Disposition: A | Discharge status: Home / Self Care | Attending: Family Medicine | Admitting: Family Medicine

## 2024-01-17 DIAGNOSIS — J111 Influenza due to unidentified influenza virus with other respiratory manifestations: Secondary | ICD-10-CM | POA: Diagnosis not present

## 2024-01-17 DIAGNOSIS — J069 Acute upper respiratory infection, unspecified: Secondary | ICD-10-CM

## 2024-01-17 LAB — POCT INFLUENZA A/B
Influenza A, POC: NEGATIVE
Influenza B, POC: NEGATIVE

## 2024-01-17 LAB — POC SOFIA SARS ANTIGEN FIA: SARS Coronavirus 2 Ag: NEGATIVE

## 2024-01-17 MED ORDER — OSELTAMIVIR PHOSPHATE 75 MG PO CAPS
75.0000 mg | ORAL_CAPSULE | Freq: Two times a day (BID) | ORAL | 0 refills | Status: AC
  Filled 2024-01-17: qty 10, 5d supply, fill #0

## 2024-01-17 NOTE — Discharge Instructions (Signed)
 Testing for flu and COVID is negative Still, with your exposure to the flu and your symptoms, I think we should treat you for influenza-like illness.  Take oseltamivir 75 mg--1 capsule 2 times daily for 5 days

## 2024-01-17 NOTE — ED Provider Notes (Signed)
 " [facility]    CSN: [id number] Arrival date & time: 01/17/24  1756      History   Chief Complaint Chief Complaint  Patient presents with   Influenza    HPI Evelyn Goodwin is a 28 y.o. female.    Influenza  Here for cough and nasal congestion and some sore throat that lasts just a little bit.  There is a lot of cough this morning.  She has had some myalgia and malaise.  No nausea vomiting or diarrhea.  Symptoms began on the evening of December 28.  She was exposed to a family member who tested positive for influenza A.  She is allergic to penicillin and vancomycin  and Vraylar  and metoprolol  and diltiazem .  Last menstrual cycle was December 30  She is on Skyrizi  and last injection was last week. Past Medical History:  Diagnosis Date   Anxiety    Clostridium difficile infection    Depression    Psoriasis    Scoliosis     Patient Active Problem List   Diagnosis Date Noted   Current severe episode of major depressive disorder without psychotic features (HCC) 11/11/2023   Post-traumatic headache 11/11/2023   Allergic rhinitis 11/11/2023   Epigastric pain 02/18/2023   Acute cystitis with hematuria 02/09/2023   Flu-like symptoms 02/09/2023   Unprotected sex 02/09/2023   Viral pharyngitis 02/09/2023   Viral URI with cough 02/09/2023   Routine adult health maintenance 09/22/2022   Insect bite of thigh 09/14/2022   Current moderate episode of major depressive disorder without prior episode (HCC) 06/25/2022   Nausea and vomiting 06/25/2022   Gastroenteritis 06/25/2022   BMI less than 19,adult 06/09/2022   Current severe episode of major depressive disorder without psychotic features without prior episode (HCC) 06/09/2022   GAD (generalized anxiety disorder) 06/09/2022   C. 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      Home Medications    Prior to Admission medications  Medication Sig Start Date End Date Taking? Authorizing Provider  acetaminophen  (TYLENOL ) 500 MG tablet Take 1 tablet (500 mg total) by mouth every 6 (six) hours as needed for fever. 02/09/23  Yes Bevin, Erika S, DO  albuterol  (VENTOLIN  HFA) 108 (90 Base) MCG/ACT inhaler Inhale 2 puffs into the lungs every 6 (six) hours as needed for wheezing or shortness of breath. 02/09/23  Yes Bevin, Erika S, DO  ALPRAZolam (XANAX) 0.25 MG tablet Take 0.25 mg by mouth 3 (three) times daily as needed. 03/24/21  Yes [provider]  bisoprolol  (ZEBETA ) 5 MG tablet Take 1 tablet (5 mg total) by mouth at bedtime. May also take extra 1/2 tablet (2.5 mg) as needed for palpitations. 12/22/23  Yes Emelia Josefa HERO, NP  buPROPion  (WELLBUTRIN  XL) 300 MG 24 hr tablet Take 1 tablet (300 mg total)  by mouth daily. 11/11/23  Yes Paseda, Folashade R, FNP  cetirizine (ZYRTEC) 10 MG tablet Take by mouth.   Yes [provider]  diazepam  (VALIUM ) 5 MG tablet Take 1 tablet (5 mg total) by mouth in the evening before bedtime, then 1 tablet 1 hour prior to appointment. 07/14/23  Yes   fluticasone  (FLONASE ) 50 MCG/ACT nasal spray Place 2 sprays into both nostrils as needed. 05/02/23  Yes Breeback, Jade L, PA-C  hydrOXYzine  (ATARAX ) 10 MG tablet Take 1 tablet (10 mg total) by mouth 3 (three) times daily as needed for itching. 10/21/22  Yes  Bevin, Erika S, DO  letrozole  (FEMARA ) 2.5 MG tablet Take 1 tablet (2.5 mg total) by mouth daily on cycle days 3, 4, 5, 6 and 7 12/21/23  Yes   ondansetron  (ZOFRAN -ODT) 4 MG disintegrating tablet Dissolve 1 tablet (4 mg total) in mouth every 8 (eight) hours as needed for nausea or vomiting. 12/12/23  Yes Beather Delon Gibson, PA  oseltamivir (TAMIFLU) 75 MG capsule Take 1 capsule (75 mg total) by mouth every 12 (twelve) hours. 01/17/24  Yes Vonna Sharlet POUR, MD  risankizumab -rzaa (SKYRIZI  PEN) 150 MG/ML pen Inject 150 mg into the skin as directed. Every 12 weeks for maintenance. 01/09/24  Yes Jegede, Olugbemiga E, MD    Family History Family History  Problem Relation Age of Onset   Psoriasis Mother    Depression Mother    Healthy Brother    Hypertension Maternal Grandmother    Hypertension Maternal Grandfather    Hypertension Paternal Grandmother    Hypertension Paternal Grandfather    Hypertension Other    Heart attack Other    Breast cancer Other    Colon cancer Neg Hx    Esophageal cancer Neg Hx    Pancreatic cancer Neg Hx    Stomach cancer Neg Hx    Rectal cancer Neg Hx     Social History Social History[1]   Allergies   Vancomycin , Diltiazem , Metoprolol , Penicillin g, Penicillins, and Vraylar  [cariprazine ]   Review of Systems Review of Systems   Physical Exam Triage Vital Signs ED Triage Vitals  Encounter Vitals Group     BP 01/17/24 1919 124/85     Girls Systolic BP Percentile --      Girls Diastolic BP Percentile --      Boys Systolic BP Percentile --      Boys Diastolic BP Percentile --      Pulse Rate 01/17/24 1919 98     Resp 01/17/24 1919 16     Temp 01/17/24 1919 (!) 97.3 F (36.3 C)     Temp Source 01/17/24 1919 Oral     SpO2 01/17/24 1919 99 %     Weight --      Height --      Head Circumference --      Peak Flow --      Pain Score 01/17/24 1915 0     Pain Loc --      Pain Education --      Exclude from Growth Chart --    No data  found.  Updated Vital Signs BP 124/85 (BP Location: Left Arm)   Pulse 98   Temp (!) 97.3 F (36.3 C) (Oral)   Resp 16   LMP 12/21/2023 (Exact Date)   SpO2 99%   Visual Acuity Right Eye Distance:   Left Eye Distance:   Bilateral Distance:    Right Eye Near:   Left Eye Near:  Bilateral Near:     Physical Exam Vitals and nursing note reviewed.  Constitutional:      General: She is not in acute distress.    Appearance: She is well-developed. She is not ill-appearing, toxic-appearing or diaphoretic.  HENT:     Head: Normocephalic and atraumatic.     Mouth/Throat:     Mouth: Mucous membranes are moist.     Comments: No erythema Eyes:     Extraocular Movements: Extraocular movements intact.     Conjunctiva/sclera: Conjunctivae normal.     Pupils: Pupils are equal, round, and reactive to light.  Cardiovascular:     Rate and Rhythm: Normal rate and regular rhythm.     Heart sounds: No murmur heard. Pulmonary:     Effort: Pulmonary effort is normal. No respiratory distress.     Breath sounds: Normal breath sounds. No stridor. No wheezing, rhonchi or rales.  Musculoskeletal:        General: No swelling.     Cervical back: Neck supple.  Lymphadenopathy:     Cervical: No cervical adenopathy.  Skin:    Capillary Refill: Capillary refill takes less than 2 seconds.     Coloration: Skin is not jaundiced or pale.  Neurological:     General: No focal deficit present.     Mental Status: She is alert and oriented to person, place, and time.  Psychiatric:        Behavior: Behavior normal.      UC Treatments / Results  Labs (all labs ordered are listed, but only abnormal results are displayed) Labs Reviewed  POCT INFLUENZA A/B  POC SOFIA SARS ANTIGEN FIA    EKG   Radiology No results found.  Procedures Procedures (including critical care time)  Medications Ordered in UC Medications - No data to display  Initial Impression / Assessment and Plan / UC Course  I  have reviewed the triage vital signs and the nursing notes.  Pertinent labs & imaging results that were available during my care of the patient were reviewed by me and considered in my medical decision making (see chart for details).     Flu and COVID testing is negative.  We discussed options and since she is on the Skyrizi  we decided to treat her for influenza-like illness.  Tamiflu was sent to the pharmacy Final Clinical Impressions(s) / UC Diagnoses   Final diagnoses:  Viral URI with cough  Influenza-like illness     Discharge Instructions      Testing for flu and COVID is negative Still, with your exposure to the flu and your symptoms, I think we should treat you for influenza-like illness.  Take oseltamivir 75 mg--1 capsule 2 times daily for 5 days      ED Prescriptions     Medication Sig Dispense Auth. Provider   oseltamivir (TAMIFLU) 75 MG capsule Take 1 capsule (75 mg total) by mouth every 12 (twelve) hours. 10 capsule Onofre Gains K, MD      PDMP not reviewed this encounter.    [1]  Social History Tobacco Use   Smoking status: Never   Smokeless tobacco: Never  Vaping Use   Vaping status: Some Days   Substances: Nicotine, Flavoring  Substance Use Topics   Alcohol use: Yes    Comment: Rare   Drug use: Not Currently     Vonna Sharlet POUR, MD 01/17/24 2048  "

## 2024-01-17 NOTE — ED Triage Notes (Signed)
 Pt c/o of dry cough, fatigue, mild sore throat, and hard time getting breath with mask on - symptoms started over the past few days. Pt reports that her father has tested positive for Flu A. Pt is taking Skyrizi  150 mg, last dose Friday. Pt denies any diarrhea, vomiting, or fever. Patient has requested COVID, flu, and strep test.

## 2024-01-18 ENCOUNTER — Other Ambulatory Visit (HOSPITAL_COMMUNITY): Payer: Self-pay

## 2024-01-23 ENCOUNTER — Other Ambulatory Visit (HOSPITAL_COMMUNITY): Payer: Self-pay

## 2024-01-23 ENCOUNTER — Other Ambulatory Visit: Payer: Self-pay

## 2024-01-26 ENCOUNTER — Ambulatory Visit (INDEPENDENT_AMBULATORY_CARE_PROVIDER_SITE_OTHER): Payer: Self-pay | Admitting: Psychiatry

## 2024-01-26 ENCOUNTER — Other Ambulatory Visit (HOSPITAL_COMMUNITY): Payer: Self-pay

## 2024-01-26 ENCOUNTER — Encounter (HOSPITAL_COMMUNITY): Payer: Self-pay | Admitting: Psychiatry

## 2024-01-26 ENCOUNTER — Other Ambulatory Visit: Payer: Self-pay

## 2024-01-26 VITALS — BP 104/68 | HR 77 | Temp 97.8°F | Ht 68.0 in | Wt 127.0 lb

## 2024-01-26 DIAGNOSIS — F331 Major depressive disorder, recurrent, moderate: Secondary | ICD-10-CM

## 2024-01-26 DIAGNOSIS — F322 Major depressive disorder, single episode, severe without psychotic features: Secondary | ICD-10-CM

## 2024-01-26 DIAGNOSIS — F419 Anxiety disorder, unspecified: Secondary | ICD-10-CM | POA: Diagnosis not present

## 2024-01-26 DIAGNOSIS — F332 Major depressive disorder, recurrent severe without psychotic features: Secondary | ICD-10-CM | POA: Diagnosis not present

## 2024-01-26 MED ORDER — BUPROPION HCL ER (XL) 300 MG PO TB24
300.0000 mg | ORAL_TABLET | Freq: Every day | ORAL | 1 refills | Status: AC
  Filled 2024-01-26: qty 30, 30d supply, fill #0

## 2024-01-26 MED ORDER — LAMOTRIGINE 25 MG PO TABS
25.0000 mg | ORAL_TABLET | Freq: Every day | ORAL | 0 refills | Status: AC
  Filled 2024-01-26: qty 60, 30d supply, fill #0
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
## 2024-01-26 NOTE — Progress Notes (Signed)
 Patient met with RN after evaluation to do a buccal swab  and to complete forms to request genetic testing through [place].  Order placed for [facility] testing and package left to be pick up by [employer] this date for processing and resulting.

## 2024-01-27 ENCOUNTER — Other Ambulatory Visit (HOSPITAL_COMMUNITY): Payer: Self-pay

## 2024-01-27 MED ORDER — LETROZOLE 2.5 MG PO TABS
2.5000 mg | ORAL_TABLET | Freq: Every day | ORAL | 0 refills | Status: AC
  Filled 2024-01-27: qty 5, 5d supply, fill #0

## 2024-01-30 ENCOUNTER — Encounter: Payer: Self-pay | Admitting: Dermatology

## 2024-01-31 ENCOUNTER — Telehealth (HOSPITAL_COMMUNITY): Payer: Self-pay | Admitting: Psychiatry

## 2024-01-31 NOTE — Telephone Encounter (Signed)
 Pt calling. She states for the first 3 days, she had crying spells. Not seeming to have them anymore. However now she is not eating. She is hungry, but has no appetite, and she already struggles with her weight.   Please advise.   CB Number [phone]  Pharmacy- [facility]   Next Visit -2/3 Last Visit - 1/8

## 2024-02-06 ENCOUNTER — Encounter (HOSPITAL_COMMUNITY): Payer: Self-pay

## 2024-02-13 ENCOUNTER — Other Ambulatory Visit: Payer: Self-pay

## 2024-02-14 ENCOUNTER — Ambulatory Visit (HOSPITAL_COMMUNITY): Admitting: Psychiatry

## 2024-02-20 ENCOUNTER — Ambulatory Visit: Admitting: Dermatology

## 2024-02-20 LAB — QUANTIFERON-TB GOLD PLUS
QuantiFERON Mitogen Value: >10 [IU]/mL
QuantiFERON Nil Value: 0.11 [IU]/mL
QuantiFERON TB1 Ag Value: 0.1 [IU]/mL
QuantiFERON TB2 Ag Value: 0.1 [IU]/mL

## 2024-02-21 ENCOUNTER — Telehealth (HOSPITAL_COMMUNITY): Admitting: Psychiatry

## 2024-04-17 ENCOUNTER — Ambulatory Visit: Admitting: Dermatology

## 2024-05-11 ENCOUNTER — Encounter: Payer: Self-pay | Admitting: Nurse Practitioner
